# Patient Record
Sex: Male | Born: 1955 | Race: White | Hispanic: No | Marital: Single | Smoking: Current every day smoker
Health system: Southern US, Community
[De-identification: ages and names within clinical notes are randomized; demographics above are authoritative.]

## PROBLEM LIST (undated history)

## (undated) ENCOUNTER — Emergency Department (HOSPITAL_COMMUNITY): Admission: EM | Payer: No Typology Code available for payment source | Source: Home / Self Care

## (undated) DIAGNOSIS — F329 Major depressive disorder, single episode, unspecified: Secondary | ICD-10-CM

## (undated) DIAGNOSIS — J449 Chronic obstructive pulmonary disease, unspecified: Secondary | ICD-10-CM

## (undated) DIAGNOSIS — J45909 Unspecified asthma, uncomplicated: Secondary | ICD-10-CM

## (undated) DIAGNOSIS — F5104 Psychophysiologic insomnia: Secondary | ICD-10-CM

## (undated) DIAGNOSIS — F32A Depression, unspecified: Secondary | ICD-10-CM

## (undated) DIAGNOSIS — K449 Diaphragmatic hernia without obstruction or gangrene: Secondary | ICD-10-CM

## (undated) DIAGNOSIS — F419 Anxiety disorder, unspecified: Secondary | ICD-10-CM

## (undated) DIAGNOSIS — R0602 Shortness of breath: Secondary | ICD-10-CM

## (undated) DIAGNOSIS — K219 Gastro-esophageal reflux disease without esophagitis: Secondary | ICD-10-CM

## (undated) DIAGNOSIS — G5601 Carpal tunnel syndrome, right upper limb: Secondary | ICD-10-CM

## (undated) DIAGNOSIS — Z720 Tobacco use: Secondary | ICD-10-CM

## (undated) DIAGNOSIS — K429 Umbilical hernia without obstruction or gangrene: Secondary | ICD-10-CM

## (undated) DIAGNOSIS — E119 Type 2 diabetes mellitus without complications: Secondary | ICD-10-CM

## (undated) DIAGNOSIS — E039 Hypothyroidism, unspecified: Secondary | ICD-10-CM

## (undated) DIAGNOSIS — R42 Dizziness and giddiness: Secondary | ICD-10-CM

## (undated) DIAGNOSIS — N529 Male erectile dysfunction, unspecified: Secondary | ICD-10-CM

## (undated) DIAGNOSIS — M1990 Unspecified osteoarthritis, unspecified site: Secondary | ICD-10-CM

## (undated) DIAGNOSIS — F319 Bipolar disorder, unspecified: Secondary | ICD-10-CM

## (undated) DIAGNOSIS — E785 Hyperlipidemia, unspecified: Secondary | ICD-10-CM

## (undated) HISTORY — DX: Gastro-esophageal reflux disease without esophagitis: K21.9

## (undated) HISTORY — DX: Shortness of breath: R06.02

## (undated) HISTORY — DX: Anxiety disorder, unspecified: F41.9

## (undated) HISTORY — DX: Depression, unspecified: F32.A

## (undated) HISTORY — DX: Umbilical hernia without obstruction or gangrene: K42.9

## (undated) HISTORY — DX: Tobacco use: Z72.0

## (undated) HISTORY — DX: Male erectile dysfunction, unspecified: N52.9

## (undated) HISTORY — DX: Psychophysiologic insomnia: F51.04

## (undated) HISTORY — PX: COLONOSCOPY: SHX174

## (undated) HISTORY — DX: Dizziness and giddiness: R42

## (undated) HISTORY — DX: Major depressive disorder, single episode, unspecified: F32.9

## (undated) HISTORY — DX: Unspecified asthma, uncomplicated: J45.909

## (undated) HISTORY — DX: Carpal tunnel syndrome, right upper limb: G56.01

## (undated) HISTORY — DX: Unspecified osteoarthritis, unspecified site: M19.90

## (undated) HISTORY — PX: UPPER GI ENDOSCOPY: SHX6162

## (undated) HISTORY — DX: Hyperlipidemia, unspecified: E78.5

## (undated) HISTORY — DX: Diaphragmatic hernia without obstruction or gangrene: K44.9

## (undated) HISTORY — DX: Bipolar disorder, unspecified: F31.9

## (undated) HISTORY — DX: Chronic obstructive pulmonary disease, unspecified: J44.9

---

## 1997-11-21 ENCOUNTER — Emergency Department (HOSPITAL_COMMUNITY): Admission: EM | Admit: 1997-11-21 | Discharge: 1997-11-21 | Payer: Self-pay | Admitting: Emergency Medicine

## 1998-07-25 ENCOUNTER — Encounter: Payer: Self-pay | Admitting: Family Medicine

## 1998-07-25 ENCOUNTER — Ambulatory Visit (HOSPITAL_COMMUNITY): Admission: RE | Admit: 1998-07-25 | Discharge: 1998-07-25 | Payer: Self-pay | Admitting: Family Medicine

## 1999-09-30 ENCOUNTER — Ambulatory Visit (HOSPITAL_COMMUNITY): Admission: RE | Admit: 1999-09-30 | Discharge: 1999-09-30 | Payer: Self-pay | Admitting: Family Medicine

## 1999-09-30 ENCOUNTER — Encounter: Payer: Self-pay | Admitting: Family Medicine

## 2001-12-30 ENCOUNTER — Emergency Department (HOSPITAL_COMMUNITY): Admission: AC | Admit: 2001-12-30 | Discharge: 2001-12-30 | Payer: Self-pay

## 2002-02-17 ENCOUNTER — Emergency Department (HOSPITAL_COMMUNITY): Admission: EM | Admit: 2002-02-17 | Discharge: 2002-02-17 | Payer: Self-pay | Admitting: *Deleted

## 2002-06-13 ENCOUNTER — Encounter: Payer: Self-pay | Admitting: Emergency Medicine

## 2002-06-13 ENCOUNTER — Emergency Department (HOSPITAL_COMMUNITY): Admission: EM | Admit: 2002-06-13 | Discharge: 2002-06-13 | Payer: Self-pay | Admitting: Emergency Medicine

## 2002-08-16 ENCOUNTER — Encounter: Admission: RE | Admit: 2002-08-16 | Discharge: 2002-08-16 | Payer: Self-pay | Admitting: Sports Medicine

## 2004-01-06 ENCOUNTER — Encounter (INDEPENDENT_AMBULATORY_CARE_PROVIDER_SITE_OTHER): Payer: Self-pay | Admitting: Specialist

## 2004-01-06 ENCOUNTER — Ambulatory Visit (HOSPITAL_COMMUNITY): Admission: RE | Admit: 2004-01-06 | Discharge: 2004-01-06 | Payer: Self-pay | Admitting: *Deleted

## 2004-08-13 ENCOUNTER — Ambulatory Visit: Payer: Self-pay | Admitting: Internal Medicine

## 2006-08-02 ENCOUNTER — Emergency Department (HOSPITAL_COMMUNITY): Admission: EM | Admit: 2006-08-02 | Discharge: 2006-08-02 | Payer: Self-pay | Admitting: Family Medicine

## 2006-12-06 ENCOUNTER — Ambulatory Visit: Payer: Self-pay | Admitting: Family Medicine

## 2006-12-06 LAB — CONVERTED CEMR LAB
AST: 12 units/L (ref 0–37)
Albumin: 4.7 g/dL (ref 3.5–5.2)
Alkaline Phosphatase: 47 units/L (ref 39–117)
BUN: 18 mg/dL (ref 6–23)
Basophils Absolute: 0.1 10*3/uL (ref 0.0–0.1)
CO2: 26 meq/L (ref 19–32)
Chloride: 104 meq/L (ref 96–112)
Creatinine, Ser: 1.08 mg/dL (ref 0.40–1.50)
Eosinophils Relative: 3 % (ref 0–5)
Lymphs Abs: 3.2 10*3/uL (ref 0.7–3.3)
MCHC: 32.7 g/dL (ref 30.0–36.0)
MCV: 95.8 fL (ref 78.0–100.0)
Sodium: 141 meq/L (ref 135–145)
Total Bilirubin: 0.5 mg/dL (ref 0.3–1.2)
Total Protein: 7.4 g/dL (ref 6.0–8.3)

## 2007-01-25 ENCOUNTER — Encounter (INDEPENDENT_AMBULATORY_CARE_PROVIDER_SITE_OTHER): Payer: Self-pay | Admitting: *Deleted

## 2007-04-05 ENCOUNTER — Ambulatory Visit: Payer: Self-pay | Admitting: Family Medicine

## 2007-04-13 ENCOUNTER — Ambulatory Visit (HOSPITAL_COMMUNITY): Admission: RE | Admit: 2007-04-13 | Discharge: 2007-04-13 | Payer: Self-pay | Admitting: Family Medicine

## 2007-06-05 ENCOUNTER — Ambulatory Visit: Payer: Self-pay | Admitting: Family Medicine

## 2007-06-08 ENCOUNTER — Ambulatory Visit: Payer: Self-pay | Admitting: *Deleted

## 2007-07-03 ENCOUNTER — Ambulatory Visit: Payer: Self-pay | Admitting: Internal Medicine

## 2008-12-14 ENCOUNTER — Ambulatory Visit (HOSPITAL_COMMUNITY): Admission: RE | Admit: 2008-12-14 | Discharge: 2008-12-14 | Payer: Self-pay | Admitting: Family Medicine

## 2009-02-16 ENCOUNTER — Emergency Department (HOSPITAL_COMMUNITY): Admission: EM | Admit: 2009-02-16 | Discharge: 2009-02-16 | Payer: Self-pay | Admitting: Emergency Medicine

## 2009-02-17 ENCOUNTER — Emergency Department (HOSPITAL_COMMUNITY): Admission: EM | Admit: 2009-02-17 | Discharge: 2009-02-17 | Payer: Self-pay | Admitting: Emergency Medicine

## 2009-03-29 ENCOUNTER — Emergency Department (HOSPITAL_COMMUNITY): Admission: EM | Admit: 2009-03-29 | Discharge: 2009-03-30 | Payer: Self-pay | Admitting: Emergency Medicine

## 2009-04-03 ENCOUNTER — Emergency Department (HOSPITAL_COMMUNITY): Admission: EM | Admit: 2009-04-03 | Discharge: 2009-04-03 | Payer: Self-pay | Admitting: Emergency Medicine

## 2009-04-05 ENCOUNTER — Emergency Department (HOSPITAL_COMMUNITY): Admission: EM | Admit: 2009-04-05 | Discharge: 2009-04-05 | Payer: Self-pay | Admitting: Emergency Medicine

## 2009-04-09 ENCOUNTER — Emergency Department (HOSPITAL_COMMUNITY): Admission: EM | Admit: 2009-04-09 | Discharge: 2009-04-09 | Payer: Self-pay | Admitting: Emergency Medicine

## 2009-04-17 ENCOUNTER — Emergency Department (HOSPITAL_COMMUNITY): Admission: EM | Admit: 2009-04-17 | Discharge: 2009-04-17 | Payer: Self-pay | Admitting: Emergency Medicine

## 2009-05-27 ENCOUNTER — Other Ambulatory Visit: Payer: Self-pay

## 2009-05-28 ENCOUNTER — Inpatient Hospital Stay (HOSPITAL_COMMUNITY): Admission: AD | Admit: 2009-05-28 | Discharge: 2009-05-30 | Payer: Self-pay | Admitting: Psychiatry

## 2009-05-28 ENCOUNTER — Ambulatory Visit: Payer: Self-pay | Admitting: Psychiatry

## 2009-07-30 ENCOUNTER — Emergency Department (HOSPITAL_COMMUNITY): Admission: EM | Admit: 2009-07-30 | Discharge: 2009-07-30 | Payer: Self-pay | Admitting: Emergency Medicine

## 2009-08-02 ENCOUNTER — Emergency Department (HOSPITAL_COMMUNITY): Admission: EM | Admit: 2009-08-02 | Discharge: 2009-08-02 | Payer: Self-pay | Admitting: Emergency Medicine

## 2009-08-03 ENCOUNTER — Other Ambulatory Visit: Payer: Self-pay | Admitting: Emergency Medicine

## 2009-08-03 ENCOUNTER — Ambulatory Visit: Payer: Self-pay | Admitting: Psychiatry

## 2009-08-04 ENCOUNTER — Inpatient Hospital Stay (HOSPITAL_COMMUNITY): Admission: AD | Admit: 2009-08-04 | Discharge: 2009-08-08 | Payer: Self-pay | Admitting: Psychiatry

## 2009-09-22 ENCOUNTER — Emergency Department (HOSPITAL_COMMUNITY): Admission: EM | Admit: 2009-09-22 | Discharge: 2009-09-22 | Payer: Self-pay | Admitting: Emergency Medicine

## 2009-09-25 ENCOUNTER — Emergency Department (HOSPITAL_COMMUNITY): Admission: EM | Admit: 2009-09-25 | Discharge: 2009-09-25 | Payer: Self-pay | Admitting: Emergency Medicine

## 2009-10-03 ENCOUNTER — Emergency Department (HOSPITAL_COMMUNITY): Admission: EM | Admit: 2009-10-03 | Discharge: 2009-10-03 | Payer: Self-pay | Admitting: Emergency Medicine

## 2009-10-30 ENCOUNTER — Emergency Department (HOSPITAL_COMMUNITY): Admission: EM | Admit: 2009-10-30 | Discharge: 2009-10-30 | Payer: Self-pay | Admitting: Emergency Medicine

## 2010-06-01 ENCOUNTER — Encounter: Payer: Self-pay | Admitting: Family Medicine

## 2010-07-26 LAB — URINALYSIS, ROUTINE W REFLEX MICROSCOPIC
Bilirubin Urine: NEGATIVE
Hgb urine dipstick: NEGATIVE
Ketones, ur: NEGATIVE mg/dL
Nitrite: NEGATIVE
Protein, ur: NEGATIVE mg/dL
Specific Gravity, Urine: 1.007 (ref 1.005–1.030)
Urobilinogen, UA: 0.2 mg/dL (ref 0.0–1.0)

## 2010-07-26 LAB — DIFFERENTIAL
Eosinophils Relative: 7 % — ABNORMAL HIGH (ref 0–5)
Monocytes Absolute: 0.7 10*3/uL (ref 0.1–1.0)

## 2010-07-26 LAB — COMPREHENSIVE METABOLIC PANEL
ALT: 32 U/L (ref 0–53)
AST: 23 U/L (ref 0–37)
Albumin: 4.5 g/dL (ref 3.5–5.2)
CO2: 25 mEq/L (ref 19–32)
GFR calc non Af Amer: >60 mL/min (ref >60)
Total Protein: 7.6 g/dL (ref 6.0–8.3)

## 2010-07-26 LAB — CBC
Hemoglobin: 16.4 g/dL (ref 13.0–17.0)
MCHC: 35.5 g/dL (ref 30.0–36.0)
MCV: 91.9 fL (ref 78.0–100.0)
RDW: 13.5 % (ref 11.5–15.5)

## 2010-07-26 LAB — RAPID URINE DRUG SCREEN, HOSP PERFORMED
Opiates: NOT DETECTED
Tetrahydrocannabinol: NOT DETECTED

## 2010-08-03 LAB — DIFFERENTIAL
Basophils Relative: 1 % (ref 0–1)
Lymphocytes Relative: 24 % (ref 12–46)
Lymphs Abs: 2.2 10*3/uL (ref 0.7–4.0)
Monocytes Relative: 6 % (ref 3–12)
Neutrophils Relative %: 64 % (ref 43–77)

## 2010-08-03 LAB — BASIC METABOLIC PANEL
BUN: 15 mg/dL (ref 6–23)
CO2: 22 mEq/L (ref 19–32)
Calcium: 9 mg/dL (ref 8.4–10.5)
Creatinine, Ser: 1.14 mg/dL (ref 0.4–1.5)
GFR calc Af Amer: >60 mL/min (ref >60)
GFR calc non Af Amer: >60 mL/min (ref >60)
Glucose, Bld: 109 mg/dL — ABNORMAL HIGH (ref 70–99)
Potassium: 3.7 mEq/L (ref 3.5–5.1)
Sodium: 134 mEq/L — ABNORMAL LOW (ref 135–145)

## 2010-08-03 LAB — CBC
HCT: 39.8 % (ref 39.0–52.0)
MCV: 94.4 fL (ref 78.0–100.0)
Platelets: 257 10*3/uL (ref 150–400)
RBC: 4.22 MIL/uL (ref 4.22–5.81)
WBC: 9.2 10*3/uL (ref 4.0–10.5)

## 2010-08-03 LAB — RAPID URINE DRUG SCREEN, HOSP PERFORMED
Barbiturates: NOT DETECTED
Tetrahydrocannabinol: NOT DETECTED

## 2010-08-03 LAB — HEPATIC FUNCTION PANEL
ALT: 21 U/L (ref 0–53)
Albumin: 3.4 g/dL — ABNORMAL LOW (ref 3.5–5.2)

## 2010-08-03 LAB — ETHANOL: Alcohol, Ethyl (B): <5 mg/dL (ref 0–10)

## 2010-08-03 LAB — LITHIUM LEVEL: Lithium Lvl: 0.83 mEq/L (ref 0.80–1.40)

## 2011-01-30 ENCOUNTER — Emergency Department (HOSPITAL_COMMUNITY)
Admission: EM | Admit: 2011-01-30 | Discharge: 2011-01-30 | Disposition: A | Discharge status: Home / Self Care | Payer: Medicaid Other | Attending: Emergency Medicine | Admitting: Emergency Medicine

## 2011-01-30 DIAGNOSIS — F1320 Sedative, hypnotic or anxiolytic dependence, uncomplicated: Secondary | ICD-10-CM | POA: Insufficient documentation

## 2011-01-30 DIAGNOSIS — R259 Unspecified abnormal involuntary movements: Secondary | ICD-10-CM | POA: Insufficient documentation

## 2011-01-30 DIAGNOSIS — Z79899 Other long term (current) drug therapy: Secondary | ICD-10-CM | POA: Insufficient documentation

## 2011-01-30 DIAGNOSIS — F19939 Other psychoactive substance use, unspecified with withdrawal, unspecified: Secondary | ICD-10-CM | POA: Insufficient documentation

## 2011-01-30 DIAGNOSIS — F411 Generalized anxiety disorder: Secondary | ICD-10-CM | POA: Insufficient documentation

## 2011-01-30 DIAGNOSIS — F319 Bipolar disorder, unspecified: Secondary | ICD-10-CM | POA: Insufficient documentation

## 2011-03-05 ENCOUNTER — Emergency Department (HOSPITAL_COMMUNITY)
Admission: EM | Admit: 2011-03-05 | Discharge: 2011-03-05 | Disposition: A | Discharge status: Home / Self Care | Payer: Medicaid Other | Attending: Emergency Medicine | Admitting: Emergency Medicine

## 2011-03-05 ENCOUNTER — Emergency Department (HOSPITAL_COMMUNITY): Payer: Medicaid Other

## 2011-03-05 DIAGNOSIS — R079 Chest pain, unspecified: Secondary | ICD-10-CM | POA: Insufficient documentation

## 2011-03-05 DIAGNOSIS — S20219A Contusion of unspecified front wall of thorax, initial encounter: Secondary | ICD-10-CM | POA: Insufficient documentation

## 2011-03-07 ENCOUNTER — Emergency Department (HOSPITAL_COMMUNITY)
Admission: EM | Admit: 2011-03-07 | Discharge: 2011-03-07 | Disposition: A | Discharge status: Home / Self Care | Payer: Medicaid Other | Attending: Emergency Medicine | Admitting: Emergency Medicine

## 2011-03-07 DIAGNOSIS — R079 Chest pain, unspecified: Secondary | ICD-10-CM | POA: Insufficient documentation

## 2011-03-07 DIAGNOSIS — IMO0002 Reserved for concepts with insufficient information to code with codable children: Secondary | ICD-10-CM | POA: Insufficient documentation

## 2011-03-07 DIAGNOSIS — S20219A Contusion of unspecified front wall of thorax, initial encounter: Secondary | ICD-10-CM | POA: Insufficient documentation

## 2011-03-07 DIAGNOSIS — Y92009 Unspecified place in unspecified non-institutional (private) residence as the place of occurrence of the external cause: Secondary | ICD-10-CM | POA: Insufficient documentation

## 2011-03-10 ENCOUNTER — Emergency Department (HOSPITAL_COMMUNITY)
Admission: EM | Admit: 2011-03-10 | Discharge: 2011-03-10 | Disposition: A | Discharge status: Home / Self Care | Payer: Medicaid Other | Attending: Emergency Medicine | Admitting: Emergency Medicine

## 2011-03-10 DIAGNOSIS — S20219A Contusion of unspecified front wall of thorax, initial encounter: Secondary | ICD-10-CM | POA: Insufficient documentation

## 2011-09-17 ENCOUNTER — Encounter: Payer: Self-pay | Admitting: *Deleted

## 2011-12-15 ENCOUNTER — Encounter: Payer: Self-pay | Admitting: Cardiology

## 2012-01-20 ENCOUNTER — Encounter: Payer: Self-pay | Admitting: Cardiology

## 2012-02-07 ENCOUNTER — Encounter: Payer: Self-pay | Admitting: Internal Medicine

## 2012-03-03 ENCOUNTER — Encounter: Payer: Self-pay | Admitting: Internal Medicine

## 2012-03-03 ENCOUNTER — Ambulatory Visit: Payer: Medicaid Other | Admitting: Internal Medicine

## 2012-03-06 ENCOUNTER — Encounter: Payer: Self-pay | Admitting: Internal Medicine

## 2012-03-06 ENCOUNTER — Ambulatory Visit (INDEPENDENT_AMBULATORY_CARE_PROVIDER_SITE_OTHER): Payer: Medicaid Other | Admitting: Internal Medicine

## 2012-03-06 VITALS — BP 126/72 | HR 88 | Ht 67.0 in | Wt 172.0 lb

## 2012-03-06 DIAGNOSIS — R1310 Dysphagia, unspecified: Secondary | ICD-10-CM

## 2012-03-06 DIAGNOSIS — E039 Hypothyroidism, unspecified: Secondary | ICD-10-CM | POA: Insufficient documentation

## 2012-03-06 DIAGNOSIS — K5900 Constipation, unspecified: Secondary | ICD-10-CM

## 2012-03-06 DIAGNOSIS — K219 Gastro-esophageal reflux disease without esophagitis: Secondary | ICD-10-CM | POA: Insufficient documentation

## 2012-03-06 DIAGNOSIS — Z1211 Encounter for screening for malignant neoplasm of colon: Secondary | ICD-10-CM

## 2012-03-06 DIAGNOSIS — R1011 Right upper quadrant pain: Secondary | ICD-10-CM

## 2012-03-06 DIAGNOSIS — J449 Chronic obstructive pulmonary disease, unspecified: Secondary | ICD-10-CM | POA: Insufficient documentation

## 2012-03-06 MED ORDER — MOVIPREP 100 G PO SOLR: 1.0000 | Freq: Once | ORAL | Status: DC

## 2012-03-06 NOTE — Progress Notes (Signed)
Patient ID: [name] [name], male   DOB: 1956-03-24, 56 y.o.   MRN: [id number]  SUBJECTIVE: HPI [name] [name] is a 56 year old male with a past medical history of GERD, COPD, hypothyroidism, and anxiety who is seen in consultation at the request of Dr. Andrey Campanile for evaluation of GERD and epigastric and right upper quadrant abdominal pain.  The patient reports a long-standing history of heartburn which has been treated with PPI. He recalls taking omeprazole but being switched by his primary doctor to pantoprazole. With this he has continued to have intermittent heartburn as well as epigastric abdominal pain. He reports that he's had a decreased appetite over the last few months as well. He denies early satiety. He also reports occasional solid food dysphagia and odynophagia. He has not lost weight. He reports an epigastric and right upper quadrant pain which feels like a "fullness" which also tends to be worse after eating. On the whole he feels constipated and has used Dulcolax at times. He denies diarrhea. He denies rectal bleeding and melena. No fevers or chills. He reports a remote colonoscopy greater than 10 years ago. He reports his thyroid function has been monitored recently by his PCP and he continues on Synthroid 150 mcg daily.  Review of Systems  As per history of present illness, otherwise negative   Past Medical History  Diagnosis Date  . SOB (shortness of breath)   . Dizziness   . Anxiety   . Arthritis   . Asthma   . COPD (chronic obstructive pulmonary disease)   . Thyroid disease     Current Outpatient Prescriptions  Medication Sig Dispense Refill  . albuterol (PROVENTIL HFA;VENTOLIN HFA) 108 (90 BASE) MCG/ACT inhaler Inhale 2 puffs into the lungs every 6 (six) hours as needed.      . ALPRAZolam (XANAX) 1 MG tablet Take 1 mg by mouth 3 (three) times daily as needed.       . budesonide-formoterol (SYMBICORT) 160-4.5 MCG/ACT inhaler Inhale 2 puffs into the lungs 2 (two) times daily.      Marland Kitchen  levothyroxine (SYNTHROID, LEVOTHROID) 150 MCG tablet Take 150 mcg by mouth daily.      . pantoprazole (PROTONIX) 40 MG tablet Take 40 mg by mouth daily.      . QUEtiapine (SEROQUEL) 300 MG tablet Take 300 mg by mouth at bedtime.      . traZODone (DESYREL) 100 MG tablet Take 300 mg by mouth at bedtime.      Marland Kitchen MOVIPREP 100 G SOLR Take 1 kit (100 g total) by mouth once.  1 kit  0    No Known Allergies  Family History  Problem Relation Age of Onset  . Arrhythmia    . ALS    . Prostate cancer Maternal Grandfather   . Colon cancer Paternal Grandfather     History  Substance Use Topics  . Smoking status: Current Every Day Smoker -- 1.0 packs/day for 40 years    Types: Cigarettes  . Smokeless tobacco: Never Used  . Alcohol Use: Yes     social    OBJECTIVE: BP 126/72  Pulse 88  Ht 5\' 7"  (1.702 m)  Wt 172 lb (78.019 kg)  BMI 26.94 kg/m2 Constitutional: Well-developed and well-nourished. No distress. HEENT: Normocephalic and atraumatic. Oropharynx is clear and moist. No oropharyngeal exudate. Conjunctivae are normal. No scleral icterus. Neck: Neck supple. Trachea midline. Cardiovascular: Normal rate, regular rhythm and intact distal pulses. No M/R/G Pulmonary/chest: Effort normal and breath sounds normal. No wheezing, rales or  rhonchi. Abdominal: Soft, mild epigastric pain without rebound or guarding, nondistended. Bowel sounds active throughout. There are no masses palpable. No hepatosplenomegaly. Extremities: no clubbing, cyanosis, or edema Lymphadenopathy: No cervical adenopathy noted. Neurological: Alert and oriented to person place and time. Skin: Skin is warm and dry. No rashes noted. Psychiatric: Normal mood and affect. Behavior is normal.  Recent labs also requested from PCP  ASSESSMENT AND PLAN: 55 year old male with a past medical history of GERD, COPD, hypothyroidism, and anxiety who is seen in consultation at the request of Dr. Andrey Campanile for evaluation of GERD and  epigastric and right upper quadrant abdominal pain.    1.  GERD/dyspepsia/dysphagia with odynophagia/right upper quadrant pain -- the patient has continued to have breakthrough heartburn as well as dyspepsia despite once daily PPI. Based on this, I recommended direct visualization with upper endoscopy. We discussed the test including the risks and benefits and he is agreeable to proceed. We also discussed the need for possible dilation should stricture or esophageal ring be found. Esophageal candidiasis is also possible given he is on Symbicort, but we can evaluate for this at endoscopy. I would also like to perform an abdominal ultrasound to better evaluate his gallbladder and rule out stone disease given his right upper quadrant pain.  2.  CRC screening -- he is due for screening colonoscopy being his last exam was greater than 10 years ago. This will be performed at the same time as his upper endoscopy. We discussed the risks and benefits of both tests and he is agreeable to proceed.  3.  Constipation -- I recommended daily MiraLAX 17 g.

## 2012-03-06 NOTE — Patient Instructions (Addendum)
You have been given a separate informational sheet regarding your tobacco use, the importance of quitting and local resources to help you quit.  You have been scheduled for an endoscopy and colonoscopy with propofol. Please follow the written instructions given to you at your visit today. Please pick up your prep at the pharmacy within the next 1-3 days. If you use inhalers (even only as needed) or a CPAP machine, please bring them with you on the day of your procedure.  Please use Miralax daily as directed. Can purchase over the counter  We have sent the following medications to your pharmacy for you to pick up at your convenience: Moviprep  We will call Dr. Andrey Campanile and obtain your lab work   You have been scheduled for an abdominal ultrasound at [facility] (1st floor of hospital) on 03-13-2012 at 8:00 am please arrive 15 minutes prior to your appointment for registration. Make certain not to have anything to eat or drink after midnight prior to your appointment. Should you need to reschedule your appointment, please contact radiology at [phone]. This test typically takes about 30 minutes to perform.

## 2012-03-07 ENCOUNTER — Telehealth: Payer: Self-pay | Admitting: Internal Medicine

## 2012-03-07 MED ORDER — POLYETHYLENE GLYCOL 3350 17 GM/SCOOP PO POWD: 17.0000 g | Freq: Every day | ORAL | Status: DC

## 2012-03-07 NOTE — Telephone Encounter (Signed)
Sent Miralax into pt's pharamcy

## 2012-03-08 ENCOUNTER — Encounter: Payer: Self-pay | Admitting: Internal Medicine

## 2012-03-08 ENCOUNTER — Ambulatory Visit (AMBULATORY_SURGERY_CENTER): Payer: Medicaid Other | Admitting: Internal Medicine

## 2012-03-08 VITALS — BP 111/58 | HR 67 | Temp 97.1°F | Resp 20 | Ht 67.0 in | Wt 172.0 lb

## 2012-03-08 DIAGNOSIS — K2970 Gastritis, unspecified, without bleeding: Secondary | ICD-10-CM

## 2012-03-08 DIAGNOSIS — K219 Gastro-esophageal reflux disease without esophagitis: Secondary | ICD-10-CM

## 2012-03-08 DIAGNOSIS — D126 Benign neoplasm of colon, unspecified: Secondary | ICD-10-CM

## 2012-03-08 DIAGNOSIS — R1011 Right upper quadrant pain: Secondary | ICD-10-CM

## 2012-03-08 DIAGNOSIS — K5900 Constipation, unspecified: Secondary | ICD-10-CM

## 2012-03-08 DIAGNOSIS — K210 Gastro-esophageal reflux disease with esophagitis: Secondary | ICD-10-CM

## 2012-03-08 DIAGNOSIS — K635 Polyp of colon: Secondary | ICD-10-CM

## 2012-03-08 DIAGNOSIS — K2990 Gastroduodenitis, unspecified, without bleeding: Secondary | ICD-10-CM

## 2012-03-08 DIAGNOSIS — R1310 Dysphagia, unspecified: Secondary | ICD-10-CM

## 2012-03-08 DIAGNOSIS — Z1211 Encounter for screening for malignant neoplasm of colon: Secondary | ICD-10-CM

## 2012-03-08 MED ORDER — SODIUM CHLORIDE 0.9 % IV SOLN: 500.0000 mL | INTRAVENOUS | Status: DC

## 2012-03-08 NOTE — Op Note (Signed)
[facility] [address] [place], [address]   COLONOSCOPY PROCEDURE REPORT  PATIENT: [name] [name], [name] [name]  MR#: [id number] BIRTHDATE: November 20, 1955 , 56  yrs. old GENDER: Male ENDOSCOPIST: Beverley Fiedler, MD PROCEDURE DATE:  03/08/2012 PROCEDURE:   Colonoscopy with cold biopsy polypectomy ASA CLASS:   Class II INDICATIONS:average risk screening and last colonoscopy performed greater than 10 years ago per patient. MEDICATIONS: MAC sedation, administered by CRNA and propofol (Diprivan) 150mg  IV  DESCRIPTION OF PROCEDURE:   After the risks benefits and alternatives of the procedure were thoroughly explained, informed consent was obtained.  A digital rectal exam revealed external hemorrhoids.   The LB CF-H180AL [id number]  endoscope was introduced through the anus and advanced to the terminal ileum which was intubated for a short distance. No adverse events experienced. The quality of the prep was good, using MoviPrep  The instrument was then slowly withdrawn as the colon was fully examined.   COLON FINDINGS: The mucosa appeared normal in the terminal ileum. Five small sessile polyps, ranging between 3-44mm in size, were found in the distal sigmoid colon and rectum.  Polypectomy was performed with cold forceps.  All resections were complete and all polyp tissue was completely retrieved.   Moderate sized internal hemorrhoids were found.   The colon mucosa was otherwise normal. Retroflexed views revealed internal hemorrhoids. The time to cecum=2 minutes 42 seconds.  Withdrawal time=11 minutes 10 seconds. The scope was withdrawn and the procedure completed. COMPLICATIONS: There were no complications.  ENDOSCOPIC IMPRESSION: 1.   Normal mucosa in the terminal ileum 2.  Five small sessile polyps, ranging between 3-10mm in size, were found in the distal sigmoid colon and rectum; Polypectomy was performed with cold forceps 3.   Moderate sized internal hemorrhoids and small  external hemorrhoids 4.   The colon mucosa was otherwise normal  RECOMMENDATIONS: 1.  Await pathology results 2.  Timing of repeat colonoscopy will be determined by pathology findings. 3.  You will receive a letter within 1-2 weeks with the results of your biopsy as well as final recommendations.  Please call my office if you have not received a letter after 3 weeks.   eSigned:  Beverley Fiedler, MD 03/08/2012 2:16 PM   cc: Benedetto Goad, MD and The Patient

## 2012-03-08 NOTE — Op Note (Signed)
[facility] [address] [place], [address]   ENDOSCOPY PROCEDURE REPORT  [name]: [name], [name]  MR#: [id number] BIRTHDATE: 1955/10/01 , 56  yrs. old GENDER: Male ENDOSCOPIST: Beverley Fiedler, MD REFERRED BY:  Benedetto Goad PROCEDURE DATE:  03/08/2012 PROCEDURE:  EGD w/ biopsy ASA CLASS:     Class II INDICATIONS:  history of GERD.   dyspepsia.   abdominal pain in [name] upper right quadrant. MEDICATIONS: MAC sedation, administered by CRNA and propofol (Diprivan) 250mg  IV TOPICAL ANESTHETIC: Cetacaine Spray  DESCRIPTION OF PROCEDURE: After [name] risks benefits and alternatives of [name] procedure were thoroughly explained, informed consent was obtained.  [name] LB GIF-H180 [id number] endoscope was introduced through [name] mouth and advanced to [name] second portion of [name] duodenum. Without limitations.  [name] instrument was slowly withdrawn as [name] mucosa was fully examined.  ESOPHAGUS: An irregular Z-line was observed 38 cm from [name] incisors. There was also an island of salmon-colored mucosa at 36 cm from [name] incisors suspicious for metaplasia.  Multiple biopsies were performed using cold forceps.  Sample sent for histology.   [name] esophagus was otherwise normal.  STOMACH: A hiatus hernia was found.   Moderate gastritis (inflammation) characterized by erythema and nodularity was found in [name] entire examined stomach.  Biopsies were taken in [name] antrum and angularis.  DUODENUM: [name] duodenal mucosa showed no abnormalities in [name] bulb and second portion of [name] duodenum.  Retroflexed views revealed a hiatal hernia.     [name] scope was then withdrawn from [name] [name] and [name] procedure completed.  COMPLICATIONS: There were no complications.  ENDOSCOPIC IMPRESSION: 1.   Irregular Z-line was observed 38 cm from [name] incisors; multiple biopsies to exclude Barrett's esophagus 2.   [name] esophagus was otherwise normal. 3.   Hiatus hernia was found 4.   Gastritis (inflammation) was found  in [name] entire examined stomach; biopsies were taken in [name] antrum and angularis 5.   [name] duodenal mucosa showed no abnormalities in [name] bulb and second portion of [name] duodenum  RECOMMENDATIONS: 1.  Await pathology results 2.   Continue pantoprazole 40 mg daily for acid control.  This is best taken 30 minutes before your first meal of [name] day. 3.  Await results of abdominal ultrasound.  eSigned:  Beverley Fiedler, MD 03/08/2012 2:11 PM  MV:HQIO Andrey Campanile, MD and [name] [name]

## 2012-03-08 NOTE — Patient Instructions (Addendum)

## 2012-03-08 NOTE — Progress Notes (Signed)
Patient did not experience any of the following events: a burn prior to discharge; a fall within the facility; wrong site/side/patient/procedure/implant event; or a hospital transfer or hospital admission upon discharge from the facility. (G8907) Patient did not have preoperative order for IV antibiotic SSI prophylaxis. (G8918)  

## 2012-03-09 ENCOUNTER — Telehealth: Payer: Self-pay | Admitting: *Deleted

## 2012-03-09 NOTE — Telephone Encounter (Signed)
  Follow up Call-  Call back number 03/08/2012  Post procedure Call Back phone  # [phone]  Permission to leave phone message Yes     Patient questions:  Do you have a fever, pain , or abdominal swelling? no Pain Score  0 *  Have you tolerated food without any problems? yes  Have you been able to return to your normal activities? yes  Do you have any questions about your discharge instructions: Diet   no Medications  no Follow up visit  no  Do you have questions or concerns about your Care? no  Actions: * If pain score is 4 or above: No action needed, pain <4.

## 2012-03-10 ENCOUNTER — Encounter: Payer: Self-pay | Admitting: Cardiology

## 2012-03-13 ENCOUNTER — Encounter: Payer: Self-pay | Admitting: Internal Medicine

## 2012-03-13 ENCOUNTER — Ambulatory Visit (HOSPITAL_COMMUNITY)
Admission: RE | Admit: 2012-03-13 | Discharge: 2012-03-13 | Disposition: A | Discharge status: Home / Self Care | Payer: Medicaid Other | Source: Ambulatory Visit | Attending: Internal Medicine | Admitting: Internal Medicine

## 2012-03-13 DIAGNOSIS — R1011 Right upper quadrant pain: Secondary | ICD-10-CM | POA: Insufficient documentation

## 2012-03-16 ENCOUNTER — Telehealth: Payer: Self-pay | Admitting: Gastroenterology

## 2012-03-16 DIAGNOSIS — R109 Unspecified abdominal pain: Secondary | ICD-10-CM

## 2012-03-16 NOTE — Telephone Encounter (Signed)
Notes Recorded by Beverley Fiedler, MD on 03/13/2012 at 3:23 PM The ultrasound reveals mild gallbladder wall thickening but no evidence of stones This does not necessarily mean the gallbladder is diseased and he continues to have right upper quadrant pain I recommend HIDA scan with CCK for more thorough gallbladder assessment. I sent a pathology letter stating his lower esophagus revealed reflux related inflammation but no evidence of Barrett's esophagus. Stomach biopsies negative for H. Pylori He should continue pantoprazole daily and follow up in clinic in 4-6 weeks   Informed pt of results and he reports he's still having the same problems. Pt reports feeling " raw or irritated in my intestines"; he states this has been going on for about a month. He reports the location is above his waist in the middle of his abdomen at the umbilicus. Food has no impact on the problem and he is having normal stools. He would like [place] to fax info to his PCP, Dr Benedetto Goad- done. Pt wants Dr Rhea Belton to know he bought electronic cigarettes.  Dr Rhea Belton, please advise; do I order the HIDA scan? Thanks.

## 2012-03-16 NOTE — Telephone Encounter (Signed)
Yes, HIDA scan with CCK

## 2012-03-16 NOTE — Telephone Encounter (Signed)
Informed pt of [name] on 03/27/12 at 10am, arrive at 0945am. NPO after midnight and if he needs to take his meds, use a small amount of water. Pt stated understanding.

## 2012-03-27 ENCOUNTER — Encounter (HOSPITAL_COMMUNITY)
Admission: RE | Admit: 2012-03-27 | Discharge: 2012-03-27 | Disposition: A | Discharge status: Home / Self Care | Payer: Medicaid Other | Source: Ambulatory Visit | Attending: Internal Medicine | Admitting: Internal Medicine

## 2012-03-27 DIAGNOSIS — R109 Unspecified abdominal pain: Secondary | ICD-10-CM | POA: Insufficient documentation

## 2012-03-27 MED ORDER — TECHNETIUM TC 99M MEBROFENIN IV KIT
5.2000 | PACK | Freq: Once | INTRAVENOUS | Status: AC | PRN
  Administered 2012-03-27: 5 via INTRAVENOUS

## 2012-03-29 ENCOUNTER — Telehealth: Payer: Self-pay | Admitting: Internal Medicine

## 2012-03-29 DIAGNOSIS — R109 Unspecified abdominal pain: Secondary | ICD-10-CM

## 2012-03-29 NOTE — Telephone Encounter (Signed)
Notes Recorded by Beverley Fiedler, MD on 03/27/2012 at 1:34 PM Normal HIDA scan and thus normal GB. Hopefully abd pain has improved with PPI Can followup in 4-6 weeks      Spoke with pt and informed him of the normal HIDA scan. He reports he is still having the same pain he's had for a month. Pain is in the RUQ but he states his whole " intestinal tract feels irritated ". He rates his pain a 5 on 1-10 scale. Food does not seem to be related. He changed from pantoprazole back to omeprazole because the omeprazole worked better. He is supposed to take Miralax daily but states he forgets some days; states his bowels are not regular. ECL on 03/08/12 with benign polyps and gastritis. U/S showed non-specific gall bladder wall thickening so HIDA was ordered.  I asked if pt has any cardiac hx and he states no, but he is scheduled to see Dr Patty Sermons on 03/31/12. Any suggestions? Thanks.

## 2012-03-29 NOTE — Telephone Encounter (Signed)
If his pain remains that severe, [place], HIDA, EGD and colon already performed, the next step would be CT abd/pelvis with contrast. Cbc, cmp, lipase, amylase

## 2012-03-30 NOTE — Telephone Encounter (Signed)
Patient advised.  CT scan scheduled at [facility] for 04/05/12 1:30.  He is asked to come for labs tomorrow and to pick up contrast and instructions for a CT.

## 2012-03-31 ENCOUNTER — Ambulatory Visit (INDEPENDENT_AMBULATORY_CARE_PROVIDER_SITE_OTHER): Payer: Medicaid Other | Admitting: Cardiology

## 2012-03-31 ENCOUNTER — Other Ambulatory Visit (INDEPENDENT_AMBULATORY_CARE_PROVIDER_SITE_OTHER): Payer: Medicaid Other

## 2012-03-31 ENCOUNTER — Encounter: Payer: Self-pay | Admitting: Cardiology

## 2012-03-31 VITALS — BP 110/72 | HR 68 | Resp 18 | Ht 67.0 in | Wt 173.8 lb

## 2012-03-31 DIAGNOSIS — R109 Unspecified abdominal pain: Secondary | ICD-10-CM

## 2012-03-31 DIAGNOSIS — R079 Chest pain, unspecified: Secondary | ICD-10-CM | POA: Insufficient documentation

## 2012-03-31 LAB — COMPREHENSIVE METABOLIC PANEL
ALT: 21 U/L (ref 0–53)
AST: 16 U/L (ref 0–37)
Albumin: 4.1 g/dL (ref 3.5–5.2)
Alkaline Phosphatase: 57 U/L (ref 39–117)
BUN: 16 mg/dL (ref 6–23)
Calcium: 9.4 mg/dL (ref 8.4–10.5)
Chloride: 105 mEq/L (ref 96–112)
Potassium: 3.8 mEq/L (ref 3.5–5.1)
Sodium: 141 mEq/L (ref 135–145)
Total Protein: 7.5 g/dL (ref 6.0–8.3)

## 2012-03-31 LAB — CBC WITH DIFFERENTIAL/PLATELET
Basophils Absolute: 0.1 10*3/uL (ref 0.0–0.1)
Eosinophils Absolute: 0.5 10*3/uL (ref 0.0–0.7)
HCT: 46.6 % (ref 39.0–52.0)
Hemoglobin: 15.2 g/dL (ref 13.0–17.0)
Lymphocytes Relative: 41.5 % (ref 12.0–46.0)
Lymphs Abs: 4.9 10*3/uL — ABNORMAL HIGH (ref 0.7–4.0)
MCHC: 32.7 g/dL (ref 30.0–36.0)
MCV: 88.6 fl (ref 78.0–100.0)
Monocytes Absolute: 1.1 10*3/uL — ABNORMAL HIGH (ref 0.1–1.0)
Neutro Abs: 5.2 10*3/uL (ref 1.4–7.7)
RDW: 13.3 % (ref 11.5–14.6)

## 2012-03-31 NOTE — Patient Instructions (Addendum)
Your physician recommends that you continue on your current medications as directed. Please refer to the Current Medication list given to you today.  Follow up as needed  

## 2012-03-31 NOTE — Progress Notes (Signed)
[name] [name] Date of Birth:  1955-06-20 [facility] [address] [place], [place]  [address] [phone]         Fax   [phone]  History of Present Illness: This 56 year old gentleman is seen after a three-year absence.  We last saw him in October 2010.  He comes in today because of some intermittent chronic left-sided chest discomfort.  He has a primary care patient of Dr. Benedetto Goad is some refill family practice.  He states that he has been told that he has COPD.  He has an extensive psychiatric history.  He states that he quit smoking 30 days ago.  He has a history of bipolar illness manic depression and anxiety.  States that he does not drink alcohol. His family history reveals that his father died in his 71s of heart-related issues including atrial fibrillation.  His mother is alive and is our patient and has mitral valve prolapse.  He has a sister who died of Hilda Blades disease. Social history reveals that he is retired on medical disability. Review of systems reveals that he is in the midst of a extensive GI workup for abdominal pain.  He is scheduled to have a CT of the abdomen next week.  His chest pain is not suggestive of myocardial ischemia.  Current Outpatient Prescriptions  Medication Sig Dispense Refill  . albuterol (PROVENTIL HFA;VENTOLIN HFA) 108 (90 BASE) MCG/ACT inhaler Inhale 2 puffs into the lungs every 6 (six) hours as needed.      . ALPRAZolam (XANAX) 1 MG tablet Take 1 mg by mouth 3 (three) times daily as needed.       . budesonide-formoterol (SYMBICORT) 160-4.5 MCG/ACT inhaler Inhale 2 puffs into the lungs 2 (two) times daily.      Marland Kitchen levothyroxine (SYNTHROID, LEVOTHROID) 150 MCG tablet Take 150 mcg by mouth daily.      Marland Kitchen omeprazole (PRILOSEC) 20 MG capsule Take 20 mg by mouth daily.      . polyethylene glycol powder (GLYCOLAX/MIRALAX) powder Take 17 g by mouth daily.  255 g  3  . traZODone (DESYREL) 100 MG tablet Take 300 mg by mouth at  bedtime.      . pantoprazole (PROTONIX) 40 MG tablet Take 40 mg by mouth daily.      . QUEtiapine (SEROQUEL) 300 MG tablet Take 300 mg by mouth at bedtime.        Allergies  Allergen Reactions  . Codeine     Nausea/vomiting  . Talwin (Pentazocine)     Hallucination, shaking    Patient Active Problem List  Diagnosis  . GERD (gastroesophageal reflux disease)  . Hypothyroidism  . COPD (chronic obstructive pulmonary disease)  . Chest pain    History  Smoking status  . Current Every Day Smoker -- 1.0 packs/day for 40 years  . Types: Cigarettes  Smokeless tobacco  . Never Used    History  Alcohol Use  . Yes    Comment: social    Family History  Problem Relation Age of Onset  . Arrhythmia    . ALS    . Prostate cancer Maternal Grandfather   . Colon cancer Paternal Grandfather     Review of Systems: Constitutional: no fever chills diaphoresis or fatigue or change in weight.  Head and neck: no hearing loss, no epistaxis, no photophobia or visual disturbance. Respiratory: No cough, shortness of breath or wheezing. Cardiovascular: No  peripheral edema, palpitations. Gastrointestinal: No abdominal distention, no abdominal pain, no  change in bowel habits hematochezia or melena. Genitourinary: No dysuria, no frequency, no urgency, no nocturia. Musculoskeletal:No arthralgias, no back pain, no gait disturbance or myalgias. Neurological: No dizziness, no headaches, no numbness, no seizures, no syncope, no weakness, no tremors. Hematologic: No lymphadenopathy, no easy bruising. Psychiatric: No confusion, no hallucinations, no sleep disturbance.    Physical Exam: Filed Vitals:   03/31/12 1540  BP: 110/72  Pulse: 68  Resp: 18   the general appearance reveals a well-developed well-nourished gentleman who appears to be in no distress.The head and neck exam reveals pupils equal and reactive.  Extraocular movements are full.  There is no scleral icterus.  The mouth and pharynx  are normal.  The neck is supple.  The carotids reveal no bruits.  The jugular venous pressure is normal.  The  thyroid is not enlarged.  There is no lymphadenopathy.  The chest is clear to percussion and auscultation.  There are no rales or rhonchi.  Expansion of the chest is symmetrical.  The precordium is quiet.  The first heart sound is normal.  The second heart sound is physiologically split.  There is no murmur gallop rub or click.  There is no abnormal lift or heave.  The abdomen is soft and nontender.  The bowel sounds are normal.  The liver and spleen are not enlarged.  There are no abdominal masses.  There are no abdominal bruits.  Extremities reveal good pedal pulses.  There is no phlebitis or edema.  There is no cyanosis or clubbing.  Strength is normal and symmetrical in all extremities.  There is no lateralizing weakness.  There are no sensory deficits.  The skin is warm and dry.  There is no rash.  EKG shows normal sinus rhythm and is within normal limits   Assessment / Plan: Impression is noncardiac chest wall pain.  He had a normal echocardiogram in 2010.  The patient was reassured that this is not his heart.  Continue same medications and continue with gastroenterology workup.  Recheck here when necessary

## 2012-04-03 ENCOUNTER — Telehealth: Payer: Self-pay | Admitting: *Deleted

## 2012-04-03 NOTE — Telephone Encounter (Signed)
Pt walked in stating he was to come back after labs on Friday and he forgot. Per chart, pt was to pick up oral contrast and instructions for his CT. Pt given instructions with NPO status and times to drink the oral contrast; pt stated understanding,

## 2012-04-05 ENCOUNTER — Ambulatory Visit (INDEPENDENT_AMBULATORY_CARE_PROVIDER_SITE_OTHER)
Admission: RE | Admit: 2012-04-05 | Discharge: 2012-04-05 | Disposition: A | Discharge status: Home / Self Care | Payer: Medicaid Other | Source: Ambulatory Visit | Attending: Internal Medicine | Admitting: Internal Medicine

## 2012-04-05 DIAGNOSIS — R109 Unspecified abdominal pain: Secondary | ICD-10-CM

## 2012-04-05 MED ORDER — IOHEXOL 300 MG/ML  SOLN
80.0000 mL | Freq: Once | INTRAMUSCULAR | Status: AC | PRN
  Administered 2012-04-05: 80 mL via INTRAVENOUS

## 2012-07-26 ENCOUNTER — Encounter (HOSPITAL_COMMUNITY): Payer: Self-pay | Admitting: *Deleted

## 2012-07-26 ENCOUNTER — Emergency Department (HOSPITAL_COMMUNITY)
Admission: EM | Admit: 2012-07-26 | Discharge: 2012-07-27 | Disposition: A | Discharge status: Home / Self Care | Payer: Medicaid Other | Attending: Emergency Medicine | Admitting: Emergency Medicine

## 2012-07-26 DIAGNOSIS — F419 Anxiety disorder, unspecified: Secondary | ICD-10-CM

## 2012-07-26 DIAGNOSIS — Z79899 Other long term (current) drug therapy: Secondary | ICD-10-CM | POA: Insufficient documentation

## 2012-07-26 DIAGNOSIS — IMO0002 Reserved for concepts with insufficient information to code with codable children: Secondary | ICD-10-CM | POA: Insufficient documentation

## 2012-07-26 DIAGNOSIS — Z8679 Personal history of other diseases of the circulatory system: Secondary | ICD-10-CM | POA: Insufficient documentation

## 2012-07-26 DIAGNOSIS — J449 Chronic obstructive pulmonary disease, unspecified: Secondary | ICD-10-CM | POA: Insufficient documentation

## 2012-07-26 DIAGNOSIS — J4489 Other specified chronic obstructive pulmonary disease: Secondary | ICD-10-CM | POA: Insufficient documentation

## 2012-07-26 DIAGNOSIS — M129 Arthropathy, unspecified: Secondary | ICD-10-CM | POA: Insufficient documentation

## 2012-07-26 DIAGNOSIS — F411 Generalized anxiety disorder: Secondary | ICD-10-CM | POA: Insufficient documentation

## 2012-07-26 DIAGNOSIS — E079 Disorder of thyroid, unspecified: Secondary | ICD-10-CM | POA: Insufficient documentation

## 2012-07-26 DIAGNOSIS — F17200 Nicotine dependence, unspecified, uncomplicated: Secondary | ICD-10-CM | POA: Insufficient documentation

## 2012-07-26 NOTE — ED Notes (Addendum)
Pt standing in the hallway. Informed pt he should wait in the room for the doctor. Pt stated he wanted to wait outside. Informed pt I would be going into another room to start an IV and requested for him to stay in his room. Pt stated, "Well, I need one, too. I need IV demerol and xanax." Informed pt that he needs to be in his room.

## 2012-07-26 NOTE — ED Notes (Signed)
Discussed with Dr. Norlene Campbell if I should start chest pain protocols. No further orders.
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
## 2012-07-26 NOTE — ED Notes (Signed)
Pt states he has chest pain from when he slipped on ice 5-7 days prior, fell on chest, left arm, right leg, left leg, and chin. Pt states "I got a doctor to see it. I think." Pt reports "I feel suicidal like I'm going to go crazy if I don't get any sleep." Pt last slept 4-5 days prior. When asked if pt was going to hurt himself, "Possibly." When asked if pt had plan he stated, "not really."

## 2012-07-27 MED ORDER — HYDROXYZINE HCL 50 MG/ML IM SOLN: 50.0000 mg | Freq: Once | INTRAMUSCULAR | Status: DC

## 2012-07-27 MED ORDER — HYDROXYZINE HCL 25 MG PO TABS
50.0000 mg | ORAL_TABLET | Freq: Once | ORAL | Status: AC
  Administered 2012-07-27: 50 mg via ORAL
  Filled 2012-07-27: qty 2

## 2012-07-27 NOTE — ED Notes (Signed)
Pt stating that vistaril does not work with him orally. Informed MD. Jovita Gamma pt the option to take the vistaril or not. Pt opted to take the vistaril. Informed him he will not be getting xanax or ativan.
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
## 2012-07-27 NOTE — ED Notes (Signed)
MD at bedside. 

## 2012-07-27 NOTE — ED Notes (Signed)
Pt taking cab home.  

## 2012-10-17 ENCOUNTER — Encounter (HOSPITAL_COMMUNITY): Payer: Self-pay

## 2012-10-17 ENCOUNTER — Emergency Department (HOSPITAL_COMMUNITY)
Admission: EM | Admit: 2012-10-17 | Discharge: 2012-10-17 | Disposition: A | Discharge status: Home / Self Care | Payer: Medicaid Other | Attending: Emergency Medicine | Admitting: Emergency Medicine

## 2012-10-17 DIAGNOSIS — J449 Chronic obstructive pulmonary disease, unspecified: Secondary | ICD-10-CM | POA: Insufficient documentation

## 2012-10-17 DIAGNOSIS — J45909 Unspecified asthma, uncomplicated: Secondary | ICD-10-CM | POA: Insufficient documentation

## 2012-10-17 DIAGNOSIS — F17200 Nicotine dependence, unspecified, uncomplicated: Secondary | ICD-10-CM | POA: Insufficient documentation

## 2012-10-17 DIAGNOSIS — L02619 Cutaneous abscess of unspecified foot: Secondary | ICD-10-CM | POA: Insufficient documentation

## 2012-10-17 DIAGNOSIS — R110 Nausea: Secondary | ICD-10-CM | POA: Insufficient documentation

## 2012-10-17 DIAGNOSIS — Z8739 Personal history of other diseases of the musculoskeletal system and connective tissue: Secondary | ICD-10-CM | POA: Insufficient documentation

## 2012-10-17 DIAGNOSIS — Z79899 Other long term (current) drug therapy: Secondary | ICD-10-CM | POA: Insufficient documentation

## 2012-10-17 DIAGNOSIS — E079 Disorder of thyroid, unspecified: Secondary | ICD-10-CM | POA: Insufficient documentation

## 2012-10-17 DIAGNOSIS — F411 Generalized anxiety disorder: Secondary | ICD-10-CM | POA: Insufficient documentation

## 2012-10-17 DIAGNOSIS — J4489 Other specified chronic obstructive pulmonary disease: Secondary | ICD-10-CM | POA: Insufficient documentation

## 2012-10-17 DIAGNOSIS — L089 Local infection of the skin and subcutaneous tissue, unspecified: Secondary | ICD-10-CM

## 2012-10-17 MED ORDER — DOXYCYCLINE HYCLATE 100 MG PO CAPS: 100.0000 mg | ORAL_CAPSULE | Freq: Two times a day (BID) | ORAL | Status: DC

## 2012-10-17 NOTE — ED Notes (Addendum)
Patient reports abscess on lateral right foot x 2 weeks. Patient reports that he has been squeezing the area and having clear liquid come out. Patient c/o nausea. Patient states his last BM was 2 days ago and took a stool softener x 2, but no results. Patient denies any abdominal pain.

## 2012-10-17 NOTE — ED Provider Notes (Signed)
History     CSN: [id number]  Arrival date & time 10/17/12  2130   First MD Initiated Contact with Patient 10/17/12 1851      Chief Complaint  Patient presents with  . Abscess  . Nausea    Patient is a 57 y.o. male presenting with abscess. The history is provided by the patient.  Abscess Location:  Foot Foot abscess location:  R foot Abscess quality: draining   Red streaking: no   Duration:  2 weeks Progression:  Worsening Chronicity:  New Context: not diabetes   Relieved by:  Draining/squeezing Worsened by:  Nothing tried Associated symptoms: nausea   Associated symptoms: no fever and no vomiting     Past Medical History  Diagnosis Date  . SOB (shortness of breath)   . Dizziness   . Anxiety   . Arthritis   . Asthma   . COPD (chronic obstructive pulmonary disease)   . Thyroid disease     History reviewed. No pertinent past surgical history.  Family History  Problem Relation Age of Onset  . Arrhythmia    . ALS    . Prostate cancer Maternal Grandfather   . Colon cancer Paternal Grandfather     History  Substance Use Topics  . Smoking status: Current Every Day Smoker -- 1.00 packs/day for 40 years    Types: Cigarettes  . Smokeless tobacco: Never Used  . Alcohol Use: Yes     Comment: social      Review of Systems  Constitutional: Negative for fever.  Gastrointestinal: Positive for nausea. Negative for vomiting.    Allergies  Codeine and Talwin  Home Medications   Current Outpatient Rx  Name  Route  Sig  Dispense  Refill  . albuterol (PROVENTIL HFA;VENTOLIN HFA) 108 (90 BASE) MCG/ACT inhaler   Inhalation   Inhale 2 puffs into the lungs every 6 (six) hours as needed for wheezing or shortness of breath.          . ALPRAZolam (XANAX) 1 MG tablet   Oral   Take 1-2 mg by mouth 3 (three) times daily. 1 mg twice a day, and 2 mg at bedtime         . budesonide-formoterol (SYMBICORT) 160-4.5 MCG/ACT inhaler   Inhalation   Inhale 2 puffs into  the lungs 2 (two) times daily.         . diphenhydrAMINE (BENADRYL) 50 MG capsule   Oral   Take 300 mg by mouth at bedtime as needed for sleep.         Marland Kitchen levothyroxine (SYNTHROID, LEVOTHROID) 150 MCG tablet   Oral   Take 150 mcg by mouth every morning.          . pantoprazole (PROTONIX) 40 MG tablet   Oral   Take 40 mg by mouth every morning.          . risperiDONE (RISPERDAL) 2 MG tablet   Oral   Take 2 mg by mouth 2 (two) times daily.         Marland Kitchen doxycycline (VIBRAMYCIN) 100 MG capsule   Oral   Take 1 capsule (100 mg total) by mouth 2 (two) times daily.   14 capsule   0     BP 137/92  Pulse 86  Temp(Src) 98.9 F (37.2 C) (Oral)  Resp 16  Ht 5\' 7"  (1.702 m)  Wt 158 lb (71.668 kg)  BMI 24.74 kg/m2  SpO2 98%  Physical Exam CONSTITUTIONAL: Well developed/well nourished HEAD: Normocephalic/atraumatic EYES:  EOMI/PERRL ENMT: Mucous membranes moist NECK: supple no meningeal signs CV: S1/S2 noted, no murmurs/rubs/gallops noted LUNGS: Lungs are clear to auscultation bilaterally, no apparent distress ABDOMEN: soft, nontender, no rebound or guarding NEURO: Pt is awake/alert, moves all extremitiesx4, no focal motor weakness, gait without ataxia EXTREMITIES: pulses normal, full ROM Small pustule on right foot on medial surface no active draining.  No bleeding noted.  No crepitance.  No fluctuance No puncture wounds noted SKIN: warm, color normal PSYCH: no abnormalities of mood noted   ED Course  Procedures   1. Skin infection       MDM  Nursing notes including past medical history and social history reviewed and considered in documentation   Pt well appearing, reports recent full lab eval by PCP does not request any blood work here He fears he has MRSA, will start doxycycline, lesion not amenable to surgical drainage He reports nausea but no vomiting and no abd tenderness        Joya Gaskins, MD 10/17/12 2026

## 2012-11-02 ENCOUNTER — Encounter (HOSPITAL_COMMUNITY): Payer: Self-pay | Admitting: Emergency Medicine

## 2012-11-02 ENCOUNTER — Emergency Department (HOSPITAL_COMMUNITY)
Admission: EM | Admit: 2012-11-02 | Discharge: 2012-11-02 | Disposition: A | Discharge status: Home / Self Care | Payer: Medicaid Other | Attending: Emergency Medicine | Admitting: Emergency Medicine

## 2012-11-02 DIAGNOSIS — E079 Disorder of thyroid, unspecified: Secondary | ICD-10-CM | POA: Insufficient documentation

## 2012-11-02 DIAGNOSIS — J441 Chronic obstructive pulmonary disease with (acute) exacerbation: Secondary | ICD-10-CM | POA: Insufficient documentation

## 2012-11-02 DIAGNOSIS — F410 Panic disorder [episodic paroxysmal anxiety] without agoraphobia: Secondary | ICD-10-CM | POA: Insufficient documentation

## 2012-11-02 DIAGNOSIS — Z8739 Personal history of other diseases of the musculoskeletal system and connective tissue: Secondary | ICD-10-CM | POA: Insufficient documentation

## 2012-11-02 DIAGNOSIS — F17200 Nicotine dependence, unspecified, uncomplicated: Secondary | ICD-10-CM | POA: Insufficient documentation

## 2012-11-02 DIAGNOSIS — Z79899 Other long term (current) drug therapy: Secondary | ICD-10-CM | POA: Insufficient documentation

## 2012-11-02 DIAGNOSIS — F419 Anxiety disorder, unspecified: Secondary | ICD-10-CM

## 2012-11-02 MED ORDER — ALPRAZOLAM 0.5 MG PO TABS
2.0000 mg | ORAL_TABLET | Freq: Once | ORAL | Status: AC
  Administered 2012-11-02: 2 mg via ORAL
  Filled 2012-11-02: qty 4

## 2012-11-02 MED ORDER — ALPRAZOLAM 1 MG PO TABS: ORAL_TABLET | ORAL | Status: DC

## 2012-11-02 NOTE — ED Notes (Signed)
States that he has been having an anxiety attack for the past couple of days. States that he takes xanax and is currently out of his medication

## 2012-11-02 NOTE — ED Provider Notes (Signed)
History    CSN: [id number] Arrival date & time 11/02/12  0902  First MD Initiated Contact with Patient 11/02/12 0914     Chief Complaint  Patient presents with  . Panic Attack   (Consider location/radiation/quality/duration/timing/severity/associated sxs/prior Treatment) Patient is a 57 y.o. male presenting with anxiety. The history is provided by the patient (the pt complains of being out of his xanax and is anxious). No language interpreter was used.  Anxiety This is a new problem. The current episode started 12 to 24 hours ago. The problem occurs constantly. The problem has not changed since onset.Pertinent negatives include no chest pain, no abdominal pain and no headaches. Nothing aggravates the symptoms. Nothing relieves the symptoms.   Past Medical History  Diagnosis Date  . SOB (shortness of breath)   . Dizziness   . Anxiety   . Arthritis   . Asthma   . COPD (chronic obstructive pulmonary disease)   . Thyroid disease    History reviewed. No pertinent past surgical history. Family History  Problem Relation Age of Onset  . Arrhythmia    . ALS    . Prostate cancer Maternal Grandfather   . Colon cancer Paternal Grandfather    History  Substance Use Topics  . Smoking status: Current Every Day Smoker -- 1.00 packs/day for 40 years    Types: Cigarettes  . Smokeless tobacco: Never Used  . Alcohol Use: Yes     Comment: social    Review of Systems  Constitutional: Negative for appetite change and fatigue.  HENT: Negative for congestion, sinus pressure and ear discharge.   Eyes: Negative for discharge.  Respiratory: Negative for cough.   Cardiovascular: Negative for chest pain.  Gastrointestinal: Negative for abdominal pain and diarrhea.  Genitourinary: Negative for frequency and hematuria.  Musculoskeletal: Negative for back pain.  Skin: Negative for rash.  Neurological: Negative for seizures and headaches.       Anxiety  Psychiatric/Behavioral: Negative for  hallucinations.    Allergies  Codeine and Talwin  Home Medications   Current Outpatient Rx  Name  Route  Sig  Dispense  Refill  . albuterol (PROVENTIL HFA;VENTOLIN HFA) 108 (90 BASE) MCG/ACT inhaler   Inhalation   Inhale 2 puffs into the lungs every 6 (six) hours as needed for wheezing or shortness of breath.          . ALPRAZolam (XANAX) 1 MG tablet   Oral   Take 1-2 mg by mouth 3 (three) times daily. 1 mg twice a day, and 2 mg at bedtime         . budesonide-formoterol (SYMBICORT) 160-4.5 MCG/ACT inhaler   Inhalation   Inhale 2 puffs into the lungs 2 (two) times daily.         . diphenhydrAMINE (BENADRYL) 50 MG capsule   Oral   Take 300 mg by mouth at bedtime as needed for sleep.         Marland Kitchen levothyroxine (SYNTHROID, LEVOTHROID) 150 MCG tablet   Oral   Take 150 mcg by mouth every morning.          . pantoprazole (PROTONIX) 40 MG tablet   Oral   Take 40 mg by mouth every morning.          . risperiDONE (RISPERDAL) 2 MG tablet   Oral   Take 2 mg by mouth 2 (two) times daily.         Marland Kitchen ALPRAZolam (XANAX) 1 MG tablet      1mg  in  the morning and lunch and 2 mg at night   30 tablet   0   . doxycycline (VIBRAMYCIN) 100 MG capsule   Oral   Take 1 capsule (100 mg total) by mouth 2 (two) times daily.   14 capsule   0    BP 122/78  Pulse 101  Temp(Src) 98 F (36.7 C) (Oral)  Resp 16  SpO2 96% Physical Exam  Constitutional: He is oriented to person, place, and time. He appears well-developed.  HENT:  Head: Normocephalic.  Eyes: Conjunctivae and EOM are normal. No scleral icterus.  Neck: Neck supple. No thyromegaly present.  Cardiovascular: Normal rate and regular rhythm.  Exam reveals no gallop and no friction rub.   No murmur heard. Pulmonary/Chest: No stridor. He has no wheezes. He has no rales. He exhibits no tenderness.  Abdominal: He exhibits no distension. There is no tenderness. There is no rebound.  Musculoskeletal: Normal range of motion.  He exhibits no edema.  Lymphadenopathy:    He has no cervical adenopathy.  Neurological: He is oriented to person, place, and time. Coordination normal.  anxious  Skin: No rash noted. No erythema.  Psychiatric: He has a normal mood and affect. His behavior is normal.    ED Course  Procedures (including critical care time) Labs Reviewed - No data to display No results found. 1. Anxiety     MDM    Benny Lennert, MD 11/02/12 [phone]

## 2012-12-23 ENCOUNTER — Emergency Department (HOSPITAL_COMMUNITY)
Admission: EM | Admit: 2012-12-23 | Discharge: 2012-12-23 | Disposition: A | Discharge status: Home / Self Care | Payer: Medicaid Other | Attending: Emergency Medicine | Admitting: Emergency Medicine

## 2012-12-23 ENCOUNTER — Encounter (HOSPITAL_COMMUNITY): Payer: Self-pay | Admitting: Emergency Medicine

## 2012-12-23 DIAGNOSIS — Z79899 Other long term (current) drug therapy: Secondary | ICD-10-CM | POA: Insufficient documentation

## 2012-12-23 DIAGNOSIS — J449 Chronic obstructive pulmonary disease, unspecified: Secondary | ICD-10-CM | POA: Insufficient documentation

## 2012-12-23 DIAGNOSIS — E079 Disorder of thyroid, unspecified: Secondary | ICD-10-CM | POA: Insufficient documentation

## 2012-12-23 DIAGNOSIS — K429 Umbilical hernia without obstruction or gangrene: Secondary | ICD-10-CM | POA: Insufficient documentation

## 2012-12-23 DIAGNOSIS — F17200 Nicotine dependence, unspecified, uncomplicated: Secondary | ICD-10-CM | POA: Insufficient documentation

## 2012-12-23 DIAGNOSIS — Z8739 Personal history of other diseases of the musculoskeletal system and connective tissue: Secondary | ICD-10-CM | POA: Insufficient documentation

## 2012-12-23 DIAGNOSIS — J4489 Other specified chronic obstructive pulmonary disease: Secondary | ICD-10-CM | POA: Insufficient documentation

## 2012-12-23 DIAGNOSIS — IMO0002 Reserved for concepts with insufficient information to code with codable children: Secondary | ICD-10-CM | POA: Insufficient documentation

## 2012-12-23 MED ORDER — KETOROLAC TROMETHAMINE 30 MG/ML IJ SOLN
30.0000 mg | Freq: Once | INTRAMUSCULAR | Status: AC
  Administered 2012-12-23: 30 mg via INTRAVENOUS
  Filled 2012-12-23: qty 1
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
## 2012-12-23 NOTE — ED Notes (Signed)
Pt c/o severe pain, protrusion noted to umbilicus. Pt states he was dx with hernia, elected not to have surgery. Unable to reduce protrusion. Pt screaming with pain.

## 2012-12-26 ENCOUNTER — Ambulatory Visit (INDEPENDENT_AMBULATORY_CARE_PROVIDER_SITE_OTHER): Payer: Medicaid Other | Admitting: General Surgery

## 2012-12-26 ENCOUNTER — Encounter (INDEPENDENT_AMBULATORY_CARE_PROVIDER_SITE_OTHER): Payer: Self-pay | Admitting: General Surgery

## 2012-12-26 VITALS — BP 110/70 | HR 78 | Resp 18 | Ht 67.0 in | Wt 148.0 lb

## 2012-12-26 DIAGNOSIS — K429 Umbilical hernia without obstruction or gangrene: Secondary | ICD-10-CM | POA: Insufficient documentation

## 2012-12-26 NOTE — Progress Notes (Signed)
Patient ID: [name] [name], male   DOB: Sep 24, 1955, 57 y.o.   MRN: [id number]  Chief Complaint  Patient presents with  . New Evaluation    eval diaphramic hernia    HPI [name] [name] is a 57 y.o. male.  We're asked to see the patient in consultation by Dr. Benedetto Goad to evaluate him for an umbilical hernia. The patient is a 57 year old white male who has been experiencing stabbing pain that his belly button for the last year. He has noticed bulging with any type of straining at his bellybutton. He has had only an occasional episode of nausea and vomiting. His appetite is good and his bowels are working normally. The pain is occurring on a daily basis. He has been to the emergency department where he got some Percocet which helped with this pain. He also had a recent CT scan that showed the umbilical hernia but there was no visceral contents within the hernia  HPI  Past Medical History  Diagnosis Date  . SOB (shortness of breath)   . Dizziness   . Anxiety   . Arthritis   . Asthma   . COPD (chronic obstructive pulmonary disease)   . Thyroid disease     History reviewed. No pertinent past surgical history.  Family History  Problem Relation Age of Onset  . Arrhythmia    . ALS    . Prostate cancer Maternal Grandfather   . Colon cancer Paternal Grandfather     Social History History  Substance Use Topics  . Smoking status: Current Every Day Smoker -- 1.00 packs/day for 40 years    Types: Cigarettes  . Smokeless tobacco: Never Used  . Alcohol Use: Yes     Comment: social    Allergies  Allergen Reactions  . Codeine Nausea And Vomiting    Nausea/vomiting  . Talwin [Pentazocine] Other (See Comments)    Hallucination, shaking    Current Outpatient Prescriptions  Medication Sig Dispense Refill  . albuterol (PROVENTIL HFA;VENTOLIN HFA) 108 (90 BASE) MCG/ACT inhaler Inhale 2 puffs into the lungs every 6 (six) hours as needed for wheezing or shortness of breath.       .  ALPRAZolam (XANAX) 1 MG tablet Take 1 mg by mouth at bedtime as needed for sleep.      . budesonide-formoterol (SYMBICORT) 160-4.5 MCG/ACT inhaler Inhale 2 puffs into the lungs 2 (two) times daily.      Marland Kitchen levothyroxine (SYNTHROID, LEVOTHROID) 150 MCG tablet Take 150 mcg by mouth every morning.       . risperiDONE (RISPERDAL) 2 MG tablet Take 2 mg by mouth 2 (two) times daily.       No current facility-administered medications for this visit.    Review of Systems Review of Systems  Constitutional: Negative.   HENT: Negative.   Eyes: Negative.   Respiratory: Positive for cough.   Cardiovascular: Negative.   Gastrointestinal: Positive for abdominal pain.  Endocrine: Negative.   Genitourinary: Negative.   Musculoskeletal: Negative.   Skin: Negative.   Allergic/Immunologic: Negative.   Neurological: Negative.   Hematological: Negative.   Psychiatric/Behavioral: Negative.     Blood pressure 110/70, pulse 78, resp. rate 18, height 5\' 7"  (1.702 m), weight 148 lb (67.132 kg).  Physical Exam Physical Exam  Constitutional: He is oriented to person, place, and time. He appears well-developed and well-nourished.  HENT:  Head: Normocephalic and atraumatic.  Eyes: Conjunctivae and EOM are normal. Pupils are equal, round, and reactive to light.  Neck:  Normal range of motion. Neck supple.  Cardiovascular: Normal rate, regular rhythm and normal heart sounds.   Pulmonary/Chest: Effort normal and breath sounds normal.  Abdominal: Soft. Bowel sounds are normal.  There is a small moderate size umbilical hernia that reduces easily. It is painful to palpation.  Musculoskeletal: Normal range of motion.  Neurological: He is alert and oriented to person, place, and time.  Skin: Skin is warm and dry.  Psychiatric: He has a normal mood and affect. His behavior is normal.    Data Reviewed As above  Assessment    The patient has a small but symptomatic umbilical hernia. Because of the risk of  incarceration and stimulation and secured benefit from having this fixed. I have discussed with him in detail the risks and benefits of the operation to fix the hernia as well as some of the technical aspects including the use of mesh and the risk of injury to the small intestine and He wishes to proceed     Plan    Plan for umbilical hernia repair with mesh        TOTH III,PAUL S 12/26/2012, 9:39 AM

## 2012-12-26 NOTE — Patient Instructions (Signed)
Plan for umbilical hernia repair with mesh 

## 2012-12-28 NOTE — Pre-Procedure Instructions (Signed)
[name] [name]  12/28/2012   Your procedure is scheduled on:  01/01/2013  Report to [facility] at 5:30 AM.  Call this number if you have problems the morning of surgery: [phone]   Remember:   Do not eat food or drink liquids after midnight.   Take these medicines the morning of surgery with A SIP OF WATER: resperidone, alprazolam, inhaler, symbicort, synthroid   Do not wear jewelry  Do not wear lotions, powders, or perfumes. You may wear deodorant.  Do not shave 48 hours prior to surgery. Men may shave face and neck.  Do not bring valuables to the hospital.  [facility] is not responsible                   for any belongings or valuables.  Contacts, dentures or bridgework may not be worn into surgery.  Leave suitcase in the car. After surgery it may be brought to your room.  For patients admitted to the hospital, checkout time is 11:00 AM the day of  discharge.   Patients discharged the day of surgery will not be allowed to drive  home.  Name and phone number of your driver:   Special Instructions: Shower using CHG 2 nights before surgery and the night before surgery.  If you shower the day of surgery use CHG.  Use special wash - you have one bottle of CHG for all showers.  You should use approximately 1/3 of the bottle for each shower.   Please read over the following fact sheets that you were given: Pain Booklet, Coughing and Deep Breathing and Surgical Site Infection Prevention

## 2012-12-29 ENCOUNTER — Encounter (HOSPITAL_COMMUNITY)
Admission: RE | Admit: 2012-12-29 | Discharge: 2012-12-29 | Disposition: A | Discharge status: Home / Self Care | Payer: Medicaid Other | Source: Ambulatory Visit | Attending: General Surgery | Admitting: General Surgery

## 2012-12-29 ENCOUNTER — Encounter (HOSPITAL_COMMUNITY): Payer: Self-pay

## 2012-12-29 ENCOUNTER — Ambulatory Visit (HOSPITAL_COMMUNITY)
Admission: RE | Admit: 2012-12-29 | Discharge: 2012-12-29 | Disposition: A | Discharge status: Home / Self Care | Payer: Medicaid Other | Source: Ambulatory Visit | Attending: General Surgery | Admitting: General Surgery

## 2012-12-29 DIAGNOSIS — Z01812 Encounter for preprocedural laboratory examination: Secondary | ICD-10-CM | POA: Insufficient documentation

## 2012-12-29 DIAGNOSIS — Z01818 Encounter for other preprocedural examination: Secondary | ICD-10-CM | POA: Insufficient documentation

## 2012-12-29 DIAGNOSIS — J438 Other emphysema: Secondary | ICD-10-CM | POA: Insufficient documentation

## 2012-12-29 HISTORY — DX: Hypothyroidism, unspecified: E03.9

## 2012-12-29 LAB — BASIC METABOLIC PANEL
Chloride: 107 mEq/L (ref 96–112)
Creatinine, Ser: 1.02 mg/dL (ref 0.50–1.35)
GFR calc Af Amer: >90 mL/min (ref >90)
Potassium: 4.3 mEq/L (ref 3.5–5.1)
Sodium: 143 mEq/L (ref 135–145)

## 2012-12-29 LAB — CBC
HCT: 48.4 % (ref 39.0–52.0)
RBC: 5.26 MIL/uL (ref 4.22–5.81)
RDW: 13.9 % (ref 11.5–15.5)
WBC: 12 10*3/uL — ABNORMAL HIGH (ref 4.0–10.5)

## 2012-12-29 MED ORDER — CHLORHEXIDINE GLUCONATE 4 % EX LIQD: 1.0000 "application " | Freq: Once | CUTANEOUS | Status: DC

## 2012-12-29 NOTE — Pre-Procedure Instructions (Signed)
[name] [name]  12/29/2012   Your procedure is scheduled on: Monday January 01, 2013  Report to [facility] at 0530 AM.  Call this number if you have problems the morning of surgery: [phone]   Remember:   Do not eat food or drink liquids after midnight.   Take these medicines the morning of surgery with A SIP OF WATER: Synthroid, Risperidone, and use and bring inhalers .   Do not wear jewelry.  Do not wear lotions, or powder. You may wear deodorant.   Men may shave face and neck.  Do not bring valuables to the hospital.  [facility] is not responsible for any belongings or valuables.  Contacts, dentures or bridgework may not be worn into surgery.  Leave suitcase in the car. After surgery it may be brought to your room.  For patients admitted to the hospital, checkout time is 11:00 AM the day of discharge.   Patients discharged the day of surgery will not be allowed to drive home.    Special Instructions: Shower using CHG 2 nights before surgery and the night before surgery.  If you shower the day of surgery use CHG.  Use special wash - you have one bottle of CHG for all showers.  You should use approximately 1/3 of the bottle for each shower.   Please read over the following fact sheets that you were given: Pain Booklet, Coughing and Deep Breathing and Surgical Site Infection Prevention

## 2012-12-31 ENCOUNTER — Encounter (HOSPITAL_COMMUNITY): Payer: Self-pay | Admitting: *Deleted

## 2012-12-31 MED ORDER — CEFAZOLIN SODIUM-DEXTROSE 2-3 GM-% IV SOLR
2.0000 g | INTRAVENOUS | Status: AC
  Administered 2013-01-01: 2 g via INTRAVENOUS
  Filled 2012-12-31: qty 50

## 2012-12-31 MED ORDER — CHLORHEXIDINE GLUCONATE 4 % EX LIQD: 1.0000 "application " | Freq: Once | CUTANEOUS | Status: DC

## 2013-01-01 ENCOUNTER — Ambulatory Visit (HOSPITAL_COMMUNITY)
Admission: RE | Admit: 2013-01-01 | Discharge: 2013-01-01 | Disposition: A | Discharge status: Home / Self Care | Payer: Medicaid Other | Source: Ambulatory Visit | Attending: General Surgery | Admitting: General Surgery

## 2013-01-01 ENCOUNTER — Ambulatory Visit (HOSPITAL_COMMUNITY): Payer: Medicaid Other | Admitting: *Deleted

## 2013-01-01 ENCOUNTER — Encounter (HOSPITAL_COMMUNITY): Payer: Self-pay | Admitting: *Deleted

## 2013-01-01 ENCOUNTER — Ambulatory Visit (INDEPENDENT_AMBULATORY_CARE_PROVIDER_SITE_OTHER): Payer: Medicaid Other | Admitting: General Surgery

## 2013-01-01 ENCOUNTER — Encounter (HOSPITAL_COMMUNITY): Payer: Self-pay | Admitting: Anesthesiology

## 2013-01-01 ENCOUNTER — Encounter (HOSPITAL_COMMUNITY)
Admission: RE | Disposition: A | Discharge status: Home / Self Care | Payer: Self-pay | Source: Ambulatory Visit | Attending: General Surgery

## 2013-01-01 DIAGNOSIS — J4489 Other specified chronic obstructive pulmonary disease: Secondary | ICD-10-CM | POA: Insufficient documentation

## 2013-01-01 DIAGNOSIS — J449 Chronic obstructive pulmonary disease, unspecified: Secondary | ICD-10-CM | POA: Insufficient documentation

## 2013-01-01 DIAGNOSIS — K429 Umbilical hernia without obstruction or gangrene: Secondary | ICD-10-CM

## 2013-01-01 HISTORY — PX: INSERTION OF MESH: SHX5868

## 2013-01-01 HISTORY — PX: UMBILICAL HERNIA REPAIR: SHX196

## 2013-01-01 SURGERY — REPAIR, HERNIA, UMBILICAL, ADULT
Anesthesia: General | Site: Abdomen | Wound class: Clean

## 2013-01-01 MED ORDER — HYDROMORPHONE HCL PF 1 MG/ML IJ SOLN
0.2500 mg | INTRAMUSCULAR | Status: DC | PRN
  Administered 2013-01-01 (×2): 0.5 mg via INTRAVENOUS

## 2013-01-01 MED ORDER — ROCURONIUM BROMIDE 100 MG/10ML IV SOLN
INTRAVENOUS | Status: DC | PRN
  Administered 2013-01-01: 40 mg via INTRAVENOUS

## 2013-01-01 MED ORDER — GLYCOPYRROLATE 0.2 MG/ML IJ SOLN
INTRAMUSCULAR | Status: DC | PRN
  Administered 2013-01-01: 0.6 mg via INTRAVENOUS

## 2013-01-01 MED ORDER — HYDROMORPHONE HCL PF 1 MG/ML IJ SOLN
INTRAMUSCULAR | Status: AC
  Administered 2013-01-01: 0.5 mg via INTRAVENOUS
  Filled 2013-01-01: qty 1

## 2013-01-01 MED ORDER — LIDOCAINE HCL (CARDIAC) 20 MG/ML IV SOLN
INTRAVENOUS | Status: DC | PRN
  Administered 2013-01-01: 40 mg via INTRAVENOUS

## 2013-01-01 MED ORDER — DEXAMETHASONE SODIUM PHOSPHATE 4 MG/ML IJ SOLN
INTRAMUSCULAR | Status: DC | PRN
  Administered 2013-01-01: 8 mg via INTRAVENOUS

## 2013-01-01 MED ORDER — ONDANSETRON HCL 4 MG/2ML IJ SOLN
INTRAMUSCULAR | Status: DC | PRN
  Administered 2013-01-01: 4 mg via INTRAVENOUS

## 2013-01-01 MED ORDER — FENTANYL CITRATE 0.05 MG/ML IJ SOLN
INTRAMUSCULAR | Status: DC | PRN
  Administered 2013-01-01 (×2): 50 ug via INTRAVENOUS
  Administered 2013-01-01: 100 ug via INTRAVENOUS
  Administered 2013-01-01: 50 ug via INTRAVENOUS

## 2013-01-01 MED ORDER — BUPIVACAINE-EPINEPHRINE 0.25% -1:200000 IJ SOLN
INTRAMUSCULAR | Status: DC | PRN
  Administered 2013-01-01: 10 mL

## 2013-01-01 MED ORDER — LACTATED RINGERS IV SOLN
INTRAVENOUS | Status: DC | PRN
  Administered 2013-01-01 (×2): via INTRAVENOUS

## 2013-01-01 MED ORDER — METOCLOPRAMIDE HCL 5 MG/ML IJ SOLN
INTRAMUSCULAR | Status: DC | PRN
  Administered 2013-01-01: 10 mg via INTRAVENOUS

## 2013-01-01 MED ORDER — MIDAZOLAM HCL 5 MG/5ML IJ SOLN
INTRAMUSCULAR | Status: DC | PRN
  Administered 2013-01-01: 2 mg via INTRAVENOUS

## 2013-01-01 MED ORDER — OXYCODONE HCL 5 MG/5ML PO SOLN: 5.0000 mg | Freq: Once | ORAL | Status: AC | PRN

## 2013-01-01 MED ORDER — OXYCODONE HCL 5 MG PO TABS
5.0000 mg | ORAL_TABLET | Freq: Once | ORAL | Status: AC | PRN
  Administered 2013-01-01: 5 mg via ORAL

## 2013-01-01 MED ORDER — OXYCODONE HCL 5 MG PO TABS
ORAL_TABLET | ORAL | Status: AC
  Filled 2013-01-01: qty 1

## 2013-01-01 MED ORDER — PROPOFOL 10 MG/ML IV BOLUS
INTRAVENOUS | Status: DC | PRN
  Administered 2013-01-01: 200 mg via INTRAVENOUS

## 2013-01-01 MED ORDER — EPHEDRINE SULFATE 50 MG/ML IJ SOLN
INTRAMUSCULAR | Status: DC | PRN
  Administered 2013-01-01: 10 mg via INTRAVENOUS

## 2013-01-01 MED ORDER — BUPIVACAINE-EPINEPHRINE PF 0.25-1:200000 % IJ SOLN
INTRAMUSCULAR | Status: AC
  Filled 2013-01-01: qty 30

## 2013-01-01 MED ORDER — 0.9 % SODIUM CHLORIDE (POUR BTL) OPTIME
TOPICAL | Status: DC | PRN
  Administered 2013-01-01: 1000 mL

## 2013-01-01 MED ORDER — HYDROCODONE-ACETAMINOPHEN 5-325 MG PO TABS: 1.0000 | ORAL_TABLET | ORAL | Status: DC | PRN

## 2013-01-01 MED ORDER — METOCLOPRAMIDE HCL 5 MG/ML IJ SOLN: 10.0000 mg | Freq: Once | INTRAMUSCULAR | Status: DC | PRN

## 2013-01-01 MED ORDER — NEOSTIGMINE METHYLSULFATE 1 MG/ML IJ SOLN
INTRAMUSCULAR | Status: DC | PRN
  Administered 2013-01-01: 5 mg via INTRAVENOUS

## 2013-01-01 SURGICAL SUPPLY — 51 items
ADH SKN CLS APL DERMABOND .7 (GAUZE/BANDAGES/DRESSINGS) ×1
BALL CTTN LRG ABS STRL LF (GAUZE/BANDAGES/DRESSINGS)
BLADE SURG 10 STRL SS (BLADE) ×2 IMPLANT
BLADE SURG 15 STRL LF DISP TIS (BLADE) ×1 IMPLANT
BLADE SURG 15 STRL SS (BLADE) ×2
BLADE SURG ROTATE 9660 (MISCELLANEOUS) IMPLANT
CHLORAPREP W/TINT 26ML (MISCELLANEOUS) ×2 IMPLANT
CLOTH BEACON ORANGE TIMEOUT ST (SAFETY) ×2 IMPLANT
COTTONBALL LRG STERILE PKG (GAUZE/BANDAGES/DRESSINGS) IMPLANT
COVER SURGICAL LIGHT HANDLE (MISCELLANEOUS) ×2 IMPLANT
DECANTER SPIKE VIAL GLASS SM (MISCELLANEOUS) ×2 IMPLANT
DERMABOND ADVANCED (GAUZE/BANDAGES/DRESSINGS) ×1
DERMABOND ADVANCED .7 DNX12 (GAUZE/BANDAGES/DRESSINGS) ×1 IMPLANT
DRAPE LAPAROSCOPIC ABDOMINAL (DRAPES) ×2 IMPLANT
DRAPE UTILITY 15X26 W/TAPE STR (DRAPE) ×4 IMPLANT
ELECT CAUTERY BLADE 6.4 (BLADE) ×2 IMPLANT
ELECT REM PT RETURN 9FT ADLT (ELECTROSURGICAL) ×2
ELECTRODE REM PT RTRN 9FT ADLT (ELECTROSURGICAL) ×1 IMPLANT
GAUZE SPONGE 4X4 16PLY XRAY LF (GAUZE/BANDAGES/DRESSINGS) ×2 IMPLANT
GLOVE BIO SURGEON STRL SZ7.5 (GLOVE) ×2 IMPLANT
GLOVE BIOGEL PI IND STRL 6 (GLOVE) IMPLANT
GLOVE BIOGEL PI IND STRL 7.0 (GLOVE) IMPLANT
GLOVE BIOGEL PI INDICATOR 6 (GLOVE) ×1
GLOVE BIOGEL PI INDICATOR 7.0 (GLOVE) ×1
GLOVE SS BIOGEL STRL SZ 6.5 (GLOVE) IMPLANT
GLOVE SUPERSENSE BIOGEL SZ 6.5 (GLOVE) ×1
GLOVE SURG SS PI 6.5 STRL IVOR (GLOVE) ×1 IMPLANT
GOWN STRL NON-REIN LRG LVL3 (GOWN DISPOSABLE) ×4 IMPLANT
KIT BASIN OR (CUSTOM PROCEDURE TRAY) ×2 IMPLANT
KIT ROOM TURNOVER OR (KITS) ×2 IMPLANT
MESH VENTRALEX ST 1-7/10 CRC S (Mesh General) ×1 IMPLANT
NDL HYPO 25GX1X1/2 BEV (NEEDLE) ×1 IMPLANT
NEEDLE HYPO 25GX1X1/2 BEV (NEEDLE) ×2 IMPLANT
NS IRRIG 1000ML POUR BTL (IV SOLUTION) ×2 IMPLANT
PACK SURGICAL SETUP 50X90 (CUSTOM PROCEDURE TRAY) ×2 IMPLANT
PAD ARMBOARD 7.5X6 YLW CONV (MISCELLANEOUS) ×4 IMPLANT
PENCIL BUTTON HOLSTER BLD 10FT (ELECTRODE) ×2 IMPLANT
SPONGE GAUZE 4X4 12PLY (GAUZE/BANDAGES/DRESSINGS) IMPLANT
SPONGE LAP 18X18 X RAY DECT (DISPOSABLE) ×2 IMPLANT
SUT MNCRL AB 4-0 PS2 18 (SUTURE) ×2 IMPLANT
SUT NOVA NAB DX-16 0-1 5-0 T12 (SUTURE) ×2 IMPLANT
SUT PROLENE 0 CT 1 CR/8 (SUTURE) IMPLANT
SUT VIC AB 2-0 SH 27 (SUTURE) ×2
SUT VIC AB 2-0 SH 27X BRD (SUTURE) ×1 IMPLANT
SUT VIC AB 3-0 SH 27 (SUTURE) ×2
SUT VIC AB 3-0 SH 27XBRD (SUTURE) ×1 IMPLANT
SYR BULB 3OZ (MISCELLANEOUS) ×2 IMPLANT
SYR CONTROL 10ML LL (SYRINGE) ×2 IMPLANT
TOWEL OR 17X24 6PK STRL BLUE (TOWEL DISPOSABLE) ×2 IMPLANT
TOWEL OR 17X26 10 PK STRL BLUE (TOWEL DISPOSABLE) ×2 IMPLANT
WATER STERILE IRR 1000ML POUR (IV SOLUTION) IMPLANT

## 2013-01-01 NOTE — Interval H&P Note (Signed)
History and Physical Interval Note:  01/01/2013 7:05 AM  [name] [name]  has presented today for surgery, with the diagnosis of umbilical hernia  The various methods of treatment have been discussed with the patient and family. After consideration of risks, benefits and other options for treatment, the patient has consented to  Procedure(s): HERNIA REPAIR UMBILICAL ADULT (N/A) INSERTION OF MESH (N/A) as a surgical intervention .  The patient's history has been reviewed, patient examined, no change in status, stable for surgery.  I have reviewed the patient's chart and labs.  Questions were answered to the patient's satisfaction.     TOTH III,PAUL S

## 2013-01-01 NOTE — Anesthesia Preprocedure Evaluation (Addendum)
Anesthesia Evaluation  Patient identified by MRN, date of birth, ID band Patient awake    Reviewed: Allergy & Precautions, H&P , NPO status , Patient's Chart, lab work & pertinent test results, reviewed documented beta blocker date and time   Airway Mallampati: II TM Distance: >3 FB Neck ROM: full    Dental   Pulmonary shortness of breath and with exertion, asthma , COPD COPD inhaler, Current Smoker,  breath sounds clear to auscultation        Cardiovascular negative cardio ROS  Rhythm:regular     Neuro/Psych negative neurological ROS  negative psych ROS   GI/Hepatic Neg liver ROS, GERD-  Medicated and Controlled,  Endo/Other  Hypothyroidism   Renal/GU negative Renal ROS  negative genitourinary   Musculoskeletal   Abdominal   Peds  Hematology negative hematology ROS (+)   Anesthesia Other Findings See surgeon's H&P   Reproductive/Obstetrics negative OB ROS                           Anesthesia Physical Anesthesia Plan  ASA: III  Anesthesia Plan: General   Post-op Pain Management:    Induction: Intravenous  Airway Management Planned: Oral ETT  Additional Equipment:   Intra-op Plan:   Post-operative Plan: Extubation in OR  Informed Consent: I have reviewed the patients History and Physical, chart, labs and discussed the procedure including the risks, benefits and alternatives for the proposed anesthesia with the patient or authorized representative who has indicated his/her understanding and acceptance.   Dental Advisory Given  Plan Discussed with: CRNA and Surgeon  Anesthesia Plan Comments:         Anesthesia Quick Evaluation

## 2013-01-01 NOTE — Preoperative (Signed)
Beta Blockers   Reason not to administer Beta Blockers:Not Applicable 

## 2013-01-01 NOTE — Anesthesia Postprocedure Evaluation (Signed)
Anesthesia Post Note  Patient: [name] [name]  Procedure(s) Performed: Procedure(s) (LRB): HERNIA REPAIR UMBILICAL ADULT (N/A) INSERTION OF MESH (N/A)  Anesthesia type: General  Patient location: PACU  Post pain: Pain level controlled  Post assessment: Patient's Cardiovascular Status Stable  Last Vitals:  Filed Vitals:   01/01/13 0915  BP: 130/85  Pulse: 70  Temp:   Resp: 16    Post vital signs: Reviewed and stable  Level of consciousness: alert  Complications: No apparent anesthesia complications

## 2013-01-01 NOTE — Op Note (Signed)
01/01/2013  8:18 AM  PATIENT:  [name] [name]  57 y.o. male  PRE-OPERATIVE DIAGNOSIS:  umbilical hernia  POST-OPERATIVE DIAGNOSIS:  umbilical hernia  PROCEDURE:  Procedure(s): HERNIA REPAIR UMBILICAL ADULT (N/A) INSERTION OF MESH (N/A)  SURGEON:  Surgeon(s) and Role:    * Robyne Askew, MD - Primary  PHYSICIAN ASSISTANT:   ASSISTANTS: none   ANESTHESIA:   general  EBL:  Total I/O In: 1300 [I.V.:1300] Out: 10 [Blood:10]  BLOOD ADMINISTERED:none  DRAINS: none   LOCAL MEDICATIONS USED:  MARCAINE     SPECIMEN:  No Specimen  DISPOSITION OF SPECIMEN:  N/A  COUNTS:  YES  TOURNIQUET:  * No tourniquets in log *  DICTATION: .Dragon Dictation After informed consent was obtained the patient was brought to the operating room and placed in the supine position on the operating room table. After adequate induction of general anesthesia the patient's abdomen was prepped with ChloraPrep, allowed to dry, and draped in usual sterile manner. The area around the umbilicus was infiltrated with quarter percent Marcaine. A small supraumbilical transversely oriented incision was made with a 15 blade knife. This incision was carried through the skin and subcutaneous tissue sharply with the electrocautery. The hernia sac was identified. The hernia sac was gently separated from the rest of the subcutaneous tissue sharply with the electrocautery. Once this was accomplished the hernia sac was easily reduced. The fascial edges appeared healthy. The peritoneum was never opened. A small Bard umbilical hernia repair patch was chosen. The mesh was placed through the fascial defect and then deployed against the inside of the abdominal wall. The fascial defect was then closed with interrupted #1 Novafil stitches incorporating the anchor of the mesh. The excess anchor was then trimmed away and discarded. Once this was accomplished the hernia defect appeared to be well repaired and the mesh was in good position.  The wound was. Copious amounts of saline. The deep layer the wound was closed with interrupted 2-0 Vicryl stitches. The skin was then closed with interrupted 4-0 Monocryl subcuticular stitches. Dermabond dressings were applied. The patient tolerated the procedure well. At the end of the case all needle sponge and instrument counts were correct. The patient was then awakened and taken to recovery in stable condition.  PLAN OF CARE: Discharge to home after PACU  PATIENT DISPOSITION:  PACU - hemodynamically stable.   Delay start of Pharmacological VTE agent (>24hrs) due to surgical blood loss or risk of bleeding: not applicable

## 2013-01-01 NOTE — H&P (View-Only) (Signed)
Patient ID: [name] [name], male   DOB: 12/29/1955, 57 y.o.   MRN: [id number]  Chief Complaint  Patient presents with  . New Evaluation    eval diaphramic hernia    HPI [name] [name] is a 57 y.o. male.  We're asked to see the patient in consultation by Dr. Fred Wilson to evaluate him for an umbilical hernia. The patient is a 57-year-old white male who has been experiencing stabbing pain that his belly button for the last year. He has noticed bulging with any type of straining at his bellybutton. He has had only an occasional episode of nausea and vomiting. His appetite is good and his bowels are working normally. The pain is occurring on a daily basis. He has been to the emergency department where he got some Percocet which helped with this pain. He also had a recent CT scan that showed the umbilical hernia but there was no visceral contents within the hernia  HPI  Past Medical History  Diagnosis Date  . SOB (shortness of breath)   . Dizziness   . Anxiety   . Arthritis   . Asthma   . COPD (chronic obstructive pulmonary disease)   . Thyroid disease     History reviewed. No pertinent past surgical history.  Family History  Problem Relation Age of Onset  . Arrhythmia    . ALS    . Prostate cancer Maternal Grandfather   . Colon cancer Paternal Grandfather     Social History History  Substance Use Topics  . Smoking status: Current Every Day Smoker -- 1.00 packs/day for 40 years    Types: Cigarettes  . Smokeless tobacco: Never Used  . Alcohol Use: Yes     Comment: social    Allergies  Allergen Reactions  . Codeine Nausea And Vomiting    Nausea/vomiting  . Talwin [Pentazocine] Other (See Comments)    Hallucination, shaking    Current Outpatient Prescriptions  Medication Sig Dispense Refill  . albuterol (PROVENTIL HFA;VENTOLIN HFA) 108 (90 BASE) MCG/ACT inhaler Inhale 2 puffs into the lungs every 6 (six) hours as needed for wheezing or shortness of breath.       .  ALPRAZolam (XANAX) 1 MG tablet Take 1 mg by mouth at bedtime as needed for sleep.      . budesonide-formoterol (SYMBICORT) 160-4.5 MCG/ACT inhaler Inhale 2 puffs into the lungs 2 (two) times daily.      . levothyroxine (SYNTHROID, LEVOTHROID) 150 MCG tablet Take 150 mcg by mouth every morning.       . risperiDONE (RISPERDAL) 2 MG tablet Take 2 mg by mouth 2 (two) times daily.       No current facility-administered medications for this visit.    Review of Systems Review of Systems  Constitutional: Negative.   HENT: Negative.   Eyes: Negative.   Respiratory: Positive for cough.   Cardiovascular: Negative.   Gastrointestinal: Positive for abdominal pain.  Endocrine: Negative.   Genitourinary: Negative.   Musculoskeletal: Negative.   Skin: Negative.   Allergic/Immunologic: Negative.   Neurological: Negative.   Hematological: Negative.   Psychiatric/Behavioral: Negative.     Blood pressure 110/70, pulse 78, resp. rate 18, height 5' 7" (1.702 m), weight 148 lb (67.132 kg).  Physical Exam Physical Exam  Constitutional: He is oriented to person, place, and time. He appears well-developed and well-nourished.  HENT:  Head: Normocephalic and atraumatic.  Eyes: Conjunctivae and EOM are normal. Pupils are equal, round, and reactive to light.  Neck:   Normal range of motion. Neck supple.  Cardiovascular: Normal rate, regular rhythm and normal heart sounds.   Pulmonary/Chest: Effort normal and breath sounds normal.  Abdominal: Soft. Bowel sounds are normal.  There is a small moderate size umbilical hernia that reduces easily. It is painful to palpation.  Musculoskeletal: Normal range of motion.  Neurological: He is alert and oriented to person, place, and time.  Skin: Skin is warm and dry.  Psychiatric: He has a normal mood and affect. His behavior is normal.    Data Reviewed As above  Assessment    The patient has a small but symptomatic umbilical hernia. Because of the risk of  incarceration and stimulation and secured benefit from having this fixed. I have discussed with him in detail the risks and benefits of the operation to fix the hernia as well as some of the technical aspects including the use of mesh and the risk of injury to the small intestine and He wishes to proceed     Plan    Plan for umbilical hernia repair with mesh        TOTH III,PAUL S 12/26/2012, 9:39 AM    

## 2013-01-01 NOTE — Anesthesia Procedure Notes (Signed)
Procedure Name: Intubation Date/Time: 01/01/2013 7:52 AM Performed by: Brien Mates DOBSON Pre-anesthesia Checklist: Patient identified, Emergency Drugs available, Patient being monitored, Suction available and Timeout performed Patient Re-evaluated:Patient Re-evaluated prior to inductionOxygen Delivery Method: Circle system utilized Preoxygenation: Pre-oxygenation with 100% oxygen Intubation Type: IV induction Ventilation: Mask ventilation without difficulty Laryngoscope Size: 2 Grade View: Grade I Tube type: Oral Tube size: 7.5 mm Number of attempts: 1 Airway Equipment and Method: Stylet Placement Confirmation: ETT inserted through vocal cords under direct vision,  positive ETCO2 and breath sounds checked- equal and bilateral Secured at: 22 cm Tube secured with: Tape Dental Injury: Teeth and Oropharynx as per pre-operative assessment

## 2013-01-01 NOTE — Transfer of Care (Signed)
Immediate Anesthesia Transfer of Care Note  Patient: [name] [name]  Procedure(s) Performed: Procedure(s): HERNIA REPAIR UMBILICAL ADULT (N/A) INSERTION OF MESH (N/A)  Patient Location: PACU  Anesthesia Type:General  Level of Consciousness: awake, alert  and oriented  Airway & Oxygen Therapy: Patient Spontanous Breathing and Patient connected to face mask oxygen  Post-op Assessment: Report given to PACU RN and Post -op Vital signs reviewed and stable  Post vital signs: Reviewed and stable  Complications: No apparent anesthesia complications

## 2013-01-02 ENCOUNTER — Encounter (HOSPITAL_COMMUNITY): Payer: Self-pay | Admitting: General Surgery

## 2013-01-05 ENCOUNTER — Emergency Department (HOSPITAL_COMMUNITY): Payer: No Typology Code available for payment source

## 2013-01-05 ENCOUNTER — Encounter (HOSPITAL_COMMUNITY): Payer: Self-pay | Admitting: Emergency Medicine

## 2013-01-05 ENCOUNTER — Emergency Department (HOSPITAL_COMMUNITY)
Admission: EM | Admit: 2013-01-05 | Discharge: 2013-01-05 | Disposition: A | Discharge status: Home / Self Care | Payer: No Typology Code available for payment source | Attending: Emergency Medicine | Admitting: Emergency Medicine

## 2013-01-05 DIAGNOSIS — Y92410 Unspecified street and highway as the place of occurrence of the external cause: Secondary | ICD-10-CM | POA: Insufficient documentation

## 2013-01-05 DIAGNOSIS — Y9389 Activity, other specified: Secondary | ICD-10-CM | POA: Insufficient documentation

## 2013-01-05 DIAGNOSIS — J449 Chronic obstructive pulmonary disease, unspecified: Secondary | ICD-10-CM | POA: Insufficient documentation

## 2013-01-05 DIAGNOSIS — E039 Hypothyroidism, unspecified: Secondary | ICD-10-CM | POA: Insufficient documentation

## 2013-01-05 DIAGNOSIS — M129 Arthropathy, unspecified: Secondary | ICD-10-CM | POA: Insufficient documentation

## 2013-01-05 DIAGNOSIS — S39012A Strain of muscle, fascia and tendon of lower back, initial encounter: Secondary | ICD-10-CM

## 2013-01-05 DIAGNOSIS — G8918 Other acute postprocedural pain: Secondary | ICD-10-CM | POA: Insufficient documentation

## 2013-01-05 DIAGNOSIS — Z9889 Other specified postprocedural states: Secondary | ICD-10-CM | POA: Insufficient documentation

## 2013-01-05 DIAGNOSIS — F411 Generalized anxiety disorder: Secondary | ICD-10-CM | POA: Insufficient documentation

## 2013-01-05 DIAGNOSIS — S335XXA Sprain of ligaments of lumbar spine, initial encounter: Secondary | ICD-10-CM | POA: Insufficient documentation

## 2013-01-05 DIAGNOSIS — Z8709 Personal history of other diseases of the respiratory system: Secondary | ICD-10-CM | POA: Insufficient documentation

## 2013-01-05 DIAGNOSIS — Z79899 Other long term (current) drug therapy: Secondary | ICD-10-CM | POA: Insufficient documentation

## 2013-01-05 DIAGNOSIS — R109 Unspecified abdominal pain: Secondary | ICD-10-CM | POA: Insufficient documentation

## 2013-01-05 DIAGNOSIS — J4489 Other specified chronic obstructive pulmonary disease: Secondary | ICD-10-CM | POA: Insufficient documentation

## 2013-01-05 DIAGNOSIS — F17200 Nicotine dependence, unspecified, uncomplicated: Secondary | ICD-10-CM | POA: Insufficient documentation
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
## 2013-01-05 NOTE — ED Notes (Signed)
Per [facility]. Pt left prior to receiving discharge papers. Pt ambulatory
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
## 2013-01-08 ENCOUNTER — Emergency Department (HOSPITAL_COMMUNITY)
Admission: EM | Admit: 2013-01-08 | Discharge: 2013-01-08 | Disposition: A | Discharge status: Home / Self Care | Payer: Medicaid Other | Attending: Emergency Medicine | Admitting: Emergency Medicine

## 2013-01-08 ENCOUNTER — Encounter (HOSPITAL_COMMUNITY): Payer: Self-pay | Admitting: Emergency Medicine

## 2013-01-08 DIAGNOSIS — F17200 Nicotine dependence, unspecified, uncomplicated: Secondary | ICD-10-CM | POA: Insufficient documentation

## 2013-01-08 DIAGNOSIS — J4489 Other specified chronic obstructive pulmonary disease: Secondary | ICD-10-CM | POA: Insufficient documentation

## 2013-01-08 DIAGNOSIS — J449 Chronic obstructive pulmonary disease, unspecified: Secondary | ICD-10-CM | POA: Insufficient documentation

## 2013-01-08 DIAGNOSIS — E039 Hypothyroidism, unspecified: Secondary | ICD-10-CM | POA: Insufficient documentation

## 2013-01-08 DIAGNOSIS — Z79899 Other long term (current) drug therapy: Secondary | ICD-10-CM | POA: Insufficient documentation

## 2013-01-08 DIAGNOSIS — Z8739 Personal history of other diseases of the musculoskeletal system and connective tissue: Secondary | ICD-10-CM | POA: Insufficient documentation

## 2013-01-08 DIAGNOSIS — F419 Anxiety disorder, unspecified: Secondary | ICD-10-CM

## 2013-01-08 DIAGNOSIS — R000 Tachycardia, unspecified: Secondary | ICD-10-CM | POA: Insufficient documentation

## 2013-01-08 DIAGNOSIS — F411 Generalized anxiety disorder: Secondary | ICD-10-CM | POA: Insufficient documentation

## 2013-01-08 MED ORDER — LORAZEPAM 1 MG PO TABS: 1.0000 mg | ORAL_TABLET | Freq: Once | ORAL | Status: DC

## 2013-01-08 MED ORDER — LORAZEPAM 1 MG PO TABS
1.0000 mg | ORAL_TABLET | Freq: Once | ORAL | Status: AC
  Administered 2013-01-08: 1 mg via ORAL
  Filled 2013-01-08: qty 1

## 2013-01-08 NOTE — ED Notes (Signed)
Pt complains of "lightheadedness and racing heart rate since I stopped taking my hydrocodone" Pt has a history of anxiety and reports to not taken his medications for 2 days.

## 2013-01-08 NOTE — ED Notes (Signed)
Pt c/o dizziness and headache. Denies chest pain

## 2013-01-08 NOTE — ED Notes (Signed)
Pt refused to have lab work drawn. PA advised

## 2013-01-08 NOTE — ED Notes (Signed)
Patient refused to have his blood drawn. 

## 2013-01-08 NOTE — ED Provider Notes (Signed)
CSN: [id number]     Arrival date & time 01/08/13  1053 History   First MD Initiated Contact with Patient 01/08/13 1113     Chief Complaint  Patient presents with  . Dizziness   (Consider location/radiation/quality/duration/timing/severity/associated sxs/prior Treatment) The history is provided by the patient.   Patient presents to the ED for lightheadedness, racing heart beat, and irritability x 2 days.  Pt states he recently had surgery on his umbilical hernia on 01/01/13.  States he was given hydrocodone for pain but ran out 2 days ago.  Since then he has been very "fidgety", unable to sleep, and anxious.  States he has been smoking increased amounts of cigarettes since then to try to calm himself.  Pt has hx of anxiety and recurrent dizziness, currently out of his xanax and is not due for refill until Wednesday 01/10/13.  Denies any chest pain or SOB.  No recent seizure activity.  No abdominal pain, N/V/D.  Past Medical History  Diagnosis Date  . SOB (shortness of breath)   . Dizziness   . Anxiety   . Arthritis   . Asthma   . COPD (chronic obstructive pulmonary disease)   . Thyroid disease   . Hypothyroidism    Past Surgical History  Procedure Laterality Date  . Colonoscopy    . Upper gi endoscopy    . Umbilical hernia repair N/A 01/01/2013    Procedure: HERNIA REPAIR UMBILICAL ADULT;  Surgeon: Robyne Askew, MD;  Location: [facility] OR;  Service: General;  Laterality: N/A;  . Insertion of mesh N/A 01/01/2013    Procedure: INSERTION OF MESH;  Surgeon: Robyne Askew, MD;  Location: [facility] OR;  Service: General;  Laterality: N/A;   Family History  Problem Relation Age of Onset  . Arrhythmia    . ALS    . Prostate cancer Maternal Grandfather   . Colon cancer Paternal Grandfather    History  Substance Use Topics  . Smoking status: Current Every Day Smoker -- 1.00 packs/day for 40 years    Types: Cigarettes  . Smokeless tobacco: Never Used  . Alcohol Use: Yes     Comment: social     Review of Systems  Neurological: Positive for light-headedness.  Psychiatric/Behavioral: The patient is nervous/anxious.   All other systems reviewed and are negative.    Allergies  Codeine and Talwin  Home Medications   Current Outpatient Rx  Name  Route  Sig  Dispense  Refill  . albuterol (PROVENTIL HFA;VENTOLIN HFA) 108 (90 BASE) MCG/ACT inhaler   Inhalation   Inhale 2 puffs into the lungs every 6 (six) hours as needed for wheezing or shortness of breath.          . ALPRAZolam (XANAX) 1 MG tablet   Oral   Take 1-2 mg by mouth 3 (three) times daily as needed for anxiety.          . budesonide-formoterol (SYMBICORT) 160-4.5 MCG/ACT inhaler   Inhalation   Inhale 2 puffs into the lungs 2 (two) times daily as needed (For chronic obstructive pulmonary disease (COPD).).          Marland Kitchen levothyroxine (SYNTHROID, LEVOTHROID) 150 MCG tablet   Oral   Take 150 mcg by mouth every morning.          . risperiDONE (RISPERDAL) 2 MG tablet   Oral   Take 2 mg by mouth 2 (two) times daily.          BP 128/92  Pulse  97  Temp(Src) 97.9 F (36.6 C) (Oral)  Resp 22  SpO2 96%  Physical Exam  Nursing note and vitals reviewed. Constitutional: He is oriented to person, place, and time. He appears well-developed and well-nourished.  HENT:  Head: Normocephalic and atraumatic.  Mouth/Throat: Oropharynx is clear and moist.  Eyes: Conjunctivae and EOM are normal. Pupils are equal, round, and reactive to light.  Neck: Normal range of motion.  Cardiovascular: Normal rate, regular rhythm and normal heart sounds.   Pulmonary/Chest: Effort normal and breath sounds normal.  Abdominal: Soft. Bowel sounds are normal.  Umbilical incision healing well without signs of infection or hehiscence  Musculoskeletal: Normal range of motion.  Neurological: He is alert and oriented to person, place, and time. He has normal strength. He displays no tremor. No cranial nerve deficit or sensory deficit.  He displays no seizure activity. Gait normal.  No tremors or seizure activity, no focal neuro deficits appreciated, ambulating unassisted without difficulty  Skin: Skin is warm and dry.  Psychiatric: His speech is normal and behavior is normal. His mood appears anxious.  Appears somewhat anxious, speech normal    ED Course  Procedures (including critical care time)   Date: 01/08/2013  Rate: 75  Rhythm: normal sinus rhythm  QRS Axis: normal  Intervals: normal  ST/T Wave abnormalities: normal  Conduction Disutrbances:none  Narrative Interpretation: NSR, no STEMI  Old EKG Reviewed: unchanged   Labs Review Labs Reviewed - No data to display Imaging Review No results found.  MDM   1. Anxiety    EKG NSR, no acute ischemic changes.  Trop ordered but pt refused blood draw stating "i was just checked out by my cardiologist last month."  I have explained that those texts 1 month ago cannot rule out an acute cardiac event, pt again declines now stating "well i don't have time to be here all day." Do not believe that sx are related to withdrawal, likely anxiety. Pt given ativan PO in the ED.  Pt afebrile, non-toxic appearing, NAD, VS stable- ok for discharge.  FU with PCP on Wednesday as previously scheduled.  Discussed plan with pt, they agreed.  Return precautions advised.  Garlon Hatchet, PA-C 01/08/13 1224

## 2013-01-08 NOTE — ED Provider Notes (Signed)
Medical screening examination/treatment/procedure(s) were performed by non-physician practitioner and as supervising physician I was immediately available for consultation/collaboration.   Junius Argyle, MD 01/08/13 2004

## 2013-01-22 ENCOUNTER — Encounter (INDEPENDENT_AMBULATORY_CARE_PROVIDER_SITE_OTHER): Payer: Medicaid Other | Admitting: General Surgery

## 2014-01-03 ENCOUNTER — Emergency Department (HOSPITAL_COMMUNITY)
Admission: EM | Admit: 2014-01-03 | Discharge: 2014-01-03 | Disposition: A | Discharge status: Home / Self Care | Payer: Medicaid Other | Attending: Emergency Medicine | Admitting: Emergency Medicine

## 2014-01-03 DIAGNOSIS — Z9889 Other specified postprocedural states: Secondary | ICD-10-CM | POA: Diagnosis not present

## 2014-01-03 DIAGNOSIS — F17200 Nicotine dependence, unspecified, uncomplicated: Secondary | ICD-10-CM | POA: Insufficient documentation

## 2014-01-03 DIAGNOSIS — K429 Umbilical hernia without obstruction or gangrene: Secondary | ICD-10-CM | POA: Insufficient documentation

## 2014-01-03 DIAGNOSIS — Z79899 Other long term (current) drug therapy: Secondary | ICD-10-CM | POA: Diagnosis not present

## 2014-01-03 DIAGNOSIS — J45901 Unspecified asthma with (acute) exacerbation: Secondary | ICD-10-CM | POA: Diagnosis not present

## 2014-01-03 DIAGNOSIS — F411 Generalized anxiety disorder: Secondary | ICD-10-CM | POA: Diagnosis not present

## 2014-01-03 DIAGNOSIS — Z8739 Personal history of other diseases of the musculoskeletal system and connective tissue: Secondary | ICD-10-CM | POA: Insufficient documentation

## 2014-01-03 DIAGNOSIS — E039 Hypothyroidism, unspecified: Secondary | ICD-10-CM | POA: Diagnosis not present

## 2014-01-03 DIAGNOSIS — J441 Chronic obstructive pulmonary disease with (acute) exacerbation: Secondary | ICD-10-CM | POA: Insufficient documentation

## 2014-01-03 DIAGNOSIS — R1033 Periumbilical pain: Secondary | ICD-10-CM | POA: Insufficient documentation

## 2014-01-03 DIAGNOSIS — R300 Dysuria: Secondary | ICD-10-CM | POA: Diagnosis not present

## 2014-01-03 MED ORDER — IBUPROFEN 600 MG PO TABS: 600.0000 mg | ORAL_TABLET | Freq: Four times a day (QID) | ORAL | Status: DC | PRN

## 2014-01-03 NOTE — ED Notes (Signed)
Pt was seen walking down the hallway.  This RN asked if he wanted his Discharge paperwork, the Pt threw up his arms, and kept walking.  MD reports that the Pt is upset that he is not going to be given narcotic pain medication.

## 2014-01-03 NOTE — Progress Notes (Signed)
[place],  [place] with patient about community resources. Patient refused resources.

## 2014-01-03 NOTE — ED Notes (Signed)
Pt reports he had abdominal hernia surgery 6 months ago. Yesterday and today pt has been lifting furniture and such, now has stabbing pain at site of surgery. Pain 7/10. Dysuria x1 week.  Denies n/v/d.

## 2014-01-03 NOTE — ED Provider Notes (Signed)
CSN: [id number]     Arrival date & time 01/03/14  1318 History   First MD Initiated Contact with Patient 01/03/14 1321     No chief complaint on file.    (Consider location/radiation/quality/duration/timing/severity/associated sxs/prior Treatment) HPI The patient identifies the painful area right at his umbilicus where he had a prior umbilical hernia surgery approximately 6 months ago. Patient denies that he can recollect the exact name of the surgeon he believes it was a Dr. Philbert Riser. He reports that he had been doing some mild lifting and now severe stabbing pain in the umbilical area. He does however report that it is 'not popped out" now.  Past Medical History  Diagnosis Date  . SOB (shortness of breath)   . Dizziness   . Anxiety   . Arthritis   . Asthma   . COPD (chronic obstructive pulmonary disease)   . Thyroid disease   . Hypothyroidism    Past Surgical History  Procedure Laterality Date  . Colonoscopy    . Upper gi endoscopy    . Umbilical hernia repair N/A 01/01/2013    Procedure: HERNIA REPAIR UMBILICAL ADULT;  Surgeon: Merrie Roof, MD;  Location: [place];  Service: General;  Laterality: N/A;  . Insertion of mesh N/A 01/01/2013    Procedure: INSERTION OF MESH;  Surgeon: Merrie Roof, MD;  Location: [place];  Service: General;  Laterality: N/A;   Family History  Problem Relation Age of Onset  . Arrhythmia    . ALS    . Prostate cancer Maternal Grandfather   . Colon cancer Paternal Grandfather    History  Substance Use Topics  . Smoking status: Current Every Day Smoker -- 1.00 packs/day for 40 years    Types: Cigarettes  . Smokeless tobacco: Never Used  . Alcohol Use: Yes     Comment: social    Review of Systems  Constitutional: No recent fever chills or general illness Respiratory: No cough difficulty breathing or chest pain. Cardiovascular: No chest pain syncope weakness. Abdomen: Central stabbing abdominal pain in umbilcus as outlined no vomiting  diarrhea or loss of appetite GU: No pain burning or urgency with urination Skin: No rashes  Allergies  Codeine and Talwin  Home Medications   Prior to Admission medications   Medication Sig Start Date End Date Taking? Authorizing Provider  albuterol (PROVENTIL HFA;VENTOLIN HFA) 108 (90 BASE) MCG/ACT inhaler Inhale 2 puffs into the lungs every 6 (six) hours as needed for wheezing or shortness of breath.     Historical Provider, MD  ALPRAZolam Duanne Moron) 1 MG tablet Take 1-2 mg by mouth 3 (three) times daily as needed for anxiety.     Historical Provider, MD  budesonide-formoterol (SYMBICORT) 160-4.5 MCG/ACT inhaler Inhale 2 puffs into the lungs 2 (two) times daily as needed (For chronic obstructive pulmonary disease (COPD).).     Historical Provider, MD  levothyroxine (SYNTHROID, LEVOTHROID) 150 MCG tablet Take 150 mcg by mouth every morning.     Historical Provider, MD  risperiDONE (RISPERDAL) 2 MG tablet Take 2 mg by mouth 2 (two) times daily.    Historical Provider, MD   There were no vitals taken for this visit. Physical Exam  Constitutional: He is oriented to person, place, and time. He appears well-developed and well-nourished.  Patient does not appear to be any in any acute distress or pain. He does however smell of alcohol and appeared to have some degree of intoxication.  HENT:  Head: Normocephalic and atraumatic.  Eyes: EOM are normal. Pupils are equal, round, and reactive to light.  Cardiovascular: Normal rate, regular rhythm and normal heart sounds.   Pulmonary/Chest: Effort normal. He has wheezes.  Abdominal: Soft. He exhibits no distension and no mass. There is tenderness (although patient endorses tenderness to palpation in the umbilicus. To deep palpation I am unable to locate any hernia at this current time). There is no rebound and no guarding.  Musculoskeletal: Normal range of motion. He exhibits no edema.  Neurological: He is alert and oriented to person, place, and time.   Skin: Skin is warm and dry.     ED Course  Procedures (including critical care time) Labs Review Labs Reviewed - No data to display  Imaging Review No results found.   EKG Interpretation None      MDM   Final diagnoses:  Umbilical hernia without obstruction and without gangrene   Currently I do not identify a umbilical hernia. Patient does seem to be hostile at this point as I have reported he will be using either ibuprofen or Tylenol for pain control. He reports this will not work for him. I did however advise that without any objective findings of hernia or acute abdominal findings that other stronger pain medications such as narcotics were not appropriate at the current time. The patient is advised that he needs to followup with his surgeon for further evaluation regarding the surgical site and the potential for intermittent hernia protrusion.     Charlesetta Shanks, MD 01/03/14 1415

## 2014-01-04 ENCOUNTER — Telehealth (INDEPENDENT_AMBULATORY_CARE_PROVIDER_SITE_OTHER): Payer: Self-pay

## 2014-01-04 NOTE — Telephone Encounter (Signed)
Pt left msg on answering service. Per Dr Darrel Hoover request I returned pts call. Pt states his umbilical area is very sore now that ER MD "mashed on area". Pt advised we cannot give pain medication unless pt is seen. Pt request appt with Dr Marlou Starks to have area checked. Pt states area has been sore for a long time. No bulge. No redness. Per Er note no hernia on exam. Pt offered appt but will need to have his PCP make appt since he has [place] [employer]. Pt states he understands and will call their office for referral.

## 2014-02-04 ENCOUNTER — Emergency Department (HOSPITAL_COMMUNITY): Payer: Medicaid Other

## 2014-02-04 ENCOUNTER — Emergency Department (HOSPITAL_COMMUNITY)
Admission: EM | Admit: 2014-02-04 | Discharge: 2014-02-04 | Disposition: A | Discharge status: Home / Self Care | Payer: Medicaid Other | Attending: Emergency Medicine | Admitting: Emergency Medicine

## 2014-02-04 ENCOUNTER — Encounter (HOSPITAL_COMMUNITY): Payer: Self-pay | Admitting: Emergency Medicine

## 2014-02-04 DIAGNOSIS — R110 Nausea: Secondary | ICD-10-CM | POA: Diagnosis not present

## 2014-02-04 DIAGNOSIS — F17200 Nicotine dependence, unspecified, uncomplicated: Secondary | ICD-10-CM | POA: Insufficient documentation

## 2014-02-04 DIAGNOSIS — Z79899 Other long term (current) drug therapy: Secondary | ICD-10-CM | POA: Insufficient documentation

## 2014-02-04 DIAGNOSIS — R42 Dizziness and giddiness: Secondary | ICD-10-CM | POA: Diagnosis not present

## 2014-02-04 DIAGNOSIS — F411 Generalized anxiety disorder: Secondary | ICD-10-CM | POA: Insufficient documentation

## 2014-02-04 DIAGNOSIS — J449 Chronic obstructive pulmonary disease, unspecified: Secondary | ICD-10-CM | POA: Diagnosis not present

## 2014-02-04 DIAGNOSIS — IMO0002 Reserved for concepts with insufficient information to code with codable children: Secondary | ICD-10-CM | POA: Diagnosis not present

## 2014-02-04 DIAGNOSIS — J4489 Other specified chronic obstructive pulmonary disease: Secondary | ICD-10-CM | POA: Insufficient documentation

## 2014-02-04 DIAGNOSIS — R079 Chest pain, unspecified: Secondary | ICD-10-CM | POA: Diagnosis not present

## 2014-02-04 DIAGNOSIS — E039 Hypothyroidism, unspecified: Secondary | ICD-10-CM | POA: Diagnosis not present

## 2014-02-04 DIAGNOSIS — M129 Arthropathy, unspecified: Secondary | ICD-10-CM | POA: Insufficient documentation

## 2014-02-04 LAB — CBC WITH DIFFERENTIAL/PLATELET
Basophils Absolute: 0.1 10*3/uL (ref 0.0–0.1)
Basophils Relative: 0 % (ref 0–1)
EOS ABS: 0.5 10*3/uL (ref 0.0–0.7)
Eosinophils Relative: 4 % (ref 0–5)
HCT: 51.5 % (ref 39.0–52.0)
HEMOGLOBIN: 17.7 g/dL — AB (ref 13.0–17.0)
LYMPHS ABS: 4.5 10*3/uL — AB (ref 0.7–4.0)
Lymphocytes Relative: 38 % (ref 12–46)
MCH: 30.8 pg (ref 26.0–34.0)
MCHC: 34.4 g/dL (ref 30.0–36.0)
MCV: 89.6 fL (ref 78.0–100.0)
MONOS PCT: 9 % (ref 3–12)
Monocytes Absolute: 1.1 10*3/uL — ABNORMAL HIGH (ref 0.1–1.0)
NEUTROS ABS: 5.9 10*3/uL (ref 1.7–7.7)
NEUTROS PCT: 49 % (ref 43–77)
PLATELETS: 222 10*3/uL (ref 150–400)
RBC: 5.75 MIL/uL (ref 4.22–5.81)
RDW: 14.3 % (ref 11.5–15.5)
WBC: 12.1 10*3/uL — AB (ref 4.0–10.5)

## 2014-02-04 LAB — BASIC METABOLIC PANEL
Anion gap: 14 (ref 5–15)
BUN: 16 mg/dL (ref 6–23)
CHLORIDE: 106 meq/L (ref 96–112)
CO2: 21 mEq/L (ref 19–32)
Calcium: 9.4 mg/dL (ref 8.4–10.5)
Creatinine, Ser: 0.94 mg/dL (ref 0.50–1.35)
GLUCOSE: 71 mg/dL (ref 70–99)
POTASSIUM: 4.4 meq/L (ref 3.7–5.3)
SODIUM: 141 meq/L (ref 137–147)

## 2014-02-04 MED ORDER — DIAZEPAM 5 MG PO TABS
5.0000 mg | ORAL_TABLET | Freq: Once | ORAL | Status: AC
  Administered 2014-02-04: 5 mg via ORAL
  Filled 2014-02-04: qty 1
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
## 2014-02-04 NOTE — Discharge Instructions (Signed)

## 2014-03-05 ENCOUNTER — Telehealth: Payer: Self-pay | Admitting: Neurology

## 2014-03-05 NOTE — Telephone Encounter (Signed)
Pt cancelled upcoming appt w/ Dr. Delice Lesch. Referred as a new patient by the ER. The pt did not wish to r/s / [name] S.

## 2014-03-08 ENCOUNTER — Other Ambulatory Visit: Payer: Self-pay | Admitting: Gastroenterology

## 2014-03-08 DIAGNOSIS — R1084 Generalized abdominal pain: Secondary | ICD-10-CM

## 2014-03-11 ENCOUNTER — Ambulatory Visit
Admission: RE | Admit: 2014-03-11 | Discharge: 2014-03-11 | Disposition: A | Discharge status: Home / Self Care | Payer: Medicaid Other | Source: Ambulatory Visit | Attending: Gastroenterology | Admitting: Gastroenterology

## 2014-03-11 DIAGNOSIS — R1084 Generalized abdominal pain: Secondary | ICD-10-CM

## 2014-03-11 MED ORDER — IOHEXOL 300 MG/ML  SOLN: 100.0000 mL | Freq: Once | INTRAMUSCULAR | Status: AC | PRN

## 2014-03-12 ENCOUNTER — Ambulatory Visit: Payer: Medicaid Other | Admitting: Neurology

## 2014-03-15 ENCOUNTER — Other Ambulatory Visit: Payer: Medicaid Other

## 2014-05-01 ENCOUNTER — Encounter: Payer: Self-pay | Admitting: Gastroenterology

## 2014-05-14 ENCOUNTER — Ambulatory Visit: Payer: Medicaid Other | Admitting: Internal Medicine

## 2014-06-18 ENCOUNTER — Other Ambulatory Visit: Payer: Self-pay | Admitting: Gastroenterology

## 2014-08-08 ENCOUNTER — Emergency Department (HOSPITAL_COMMUNITY)
Admission: EM | Admit: 2014-08-08 | Discharge: 2014-08-08 | Disposition: A | Discharge status: Home / Self Care | Payer: Medicaid Other | Attending: Emergency Medicine | Admitting: Emergency Medicine

## 2014-08-08 DIAGNOSIS — E039 Hypothyroidism, unspecified: Secondary | ICD-10-CM | POA: Insufficient documentation

## 2014-08-08 DIAGNOSIS — Z720 Tobacco use: Secondary | ICD-10-CM | POA: Insufficient documentation

## 2014-08-08 DIAGNOSIS — K219 Gastro-esophageal reflux disease without esophagitis: Secondary | ICD-10-CM | POA: Insufficient documentation

## 2014-08-08 DIAGNOSIS — F319 Bipolar disorder, unspecified: Secondary | ICD-10-CM | POA: Insufficient documentation

## 2014-08-08 DIAGNOSIS — J449 Chronic obstructive pulmonary disease, unspecified: Secondary | ICD-10-CM | POA: Diagnosis not present

## 2014-08-08 DIAGNOSIS — F419 Anxiety disorder, unspecified: Secondary | ICD-10-CM | POA: Diagnosis not present

## 2014-08-08 DIAGNOSIS — Z79899 Other long term (current) drug therapy: Secondary | ICD-10-CM | POA: Diagnosis not present

## 2014-08-08 DIAGNOSIS — G4700 Insomnia, unspecified: Secondary | ICD-10-CM | POA: Insufficient documentation

## 2014-08-08 DIAGNOSIS — N529 Male erectile dysfunction, unspecified: Secondary | ICD-10-CM | POA: Diagnosis not present

## 2014-08-08 DIAGNOSIS — Z760 Encounter for issue of repeat prescription: Secondary | ICD-10-CM | POA: Diagnosis not present

## 2014-08-08 DIAGNOSIS — Z8739 Personal history of other diseases of the musculoskeletal system and connective tissue: Secondary | ICD-10-CM | POA: Diagnosis not present
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
## 2014-08-08 NOTE — ED Provider Notes (Signed)
CSN: [id number]     Arrival date & time 08/08/14  1216 History  This chart was scribed for non-physician practitioner, Montine Circle, working with Virgel Manifold, MD by Molli Posey, ED Scribe. This patient was seen in room WTR5/WTR5 and the patient's care was started at 12:30 PM.   Chief Complaint  Patient presents with  . Medication Refill    pt ran out of his medication, four days early   HPI HPI Comments: [name] [name] is a 59 y.o. male with a history of anxiety, asthma, COPD and GERD who presents to the Emergency Department for a medication refill for his klonopin. Pt states he has recently been taking 1 klonopin daily until 3 days ago when he ran out. Pt states he has been trying to decrease his klonopin medication but states he is now going through withdrawals. He states that his typical dosage is 1mg  klonopin b.i.d. Pt reports he gets his medication refill from his PCP on 08/12/14 and he presented to ED to receive medication until that time. Patient states that he tried calling his PCP for a refill, but was told that no refill could be given until 08/12/14.   Past Medical History  Diagnosis Date  . SOB (shortness of breath)   . Dizziness   . Anxiety   . Arthritis   . Asthma   . COPD (chronic obstructive pulmonary disease)   . Hypothyroidism   . Bipolar 1 disorder   . Chronic insomnia   . Depression   . Diaphragmatic hernia   . ED (erectile dysfunction)   . GERD (gastroesophageal reflux disease)   . HLD (hyperlipidemia)   . Umbilical hernia without obstruction and without gangrene   . Tobacco use    Past Surgical History  Procedure Laterality Date  . Colonoscopy    . Upper gi endoscopy    . Umbilical hernia repair N/A 01/01/2013    Procedure: HERNIA REPAIR UMBILICAL ADULT;  Surgeon: Merrie Roof, MD;  Location: [place];  Service: General;  Laterality: N/A;  . Insertion of mesh N/A 01/01/2013    Procedure: INSERTION OF MESH;  Surgeon: Merrie Roof, MD;  Location: [place];   Service: General;  Laterality: N/A;   Family History  Problem Relation Age of Onset  . Arrhythmia    . ALS    . Prostate cancer Maternal Grandfather   . Colon cancer Paternal Grandfather   . Heart disease Mother    History  Substance Use Topics  . Smoking status: Current Every Day Smoker -- 1.00 packs/day for 40 years    Types: Cigarettes  . Smokeless tobacco: Never Used  . Alcohol Use: Yes     Comment: social    Review of Systems  Psychiatric/Behavioral: Positive for agitation. The patient is nervous/anxious.       Allergies  Codeine; Lipitor; Lithium carbonate; Pollen extract; and Talwin  Home Medications   Prior to Admission medications   Medication Sig Start Date End Date Taking? Authorizing Provider  albuterol (PROVENTIL HFA;VENTOLIN HFA) 108 (90 BASE) MCG/ACT inhaler Inhale 1-2 puffs into the lungs 4 (four) times daily as needed for wheezing or shortness of breath (wheezing).     Historical Provider, MD  ALPRAZolam Duanne Moron) 1 MG tablet Take 1 mg by mouth 3 (three) times daily as needed for anxiety (anxiety).     Historical Provider, MD  budesonide-formoterol (SYMBICORT) 160-4.5 MCG/ACT inhaler Inhale 1-2 puffs into the lungs every 12 (twelve) hours.     Historical  Provider, MD  escitalopram (LEXAPRO) 5 MG tablet Take 5 mg by mouth daily.    Historical Provider, MD  levothyroxine (SYNTHROID, LEVOTHROID) 150 MCG tablet Take 150 mcg by mouth every morning.     Historical Provider, MD  pantoprazole (PROTONIX) 40 MG tablet Take 40 mg by mouth daily.    Historical Provider, MD  risperiDONE (RISPERDAL) 2 MG tablet Take 2 mg by mouth at bedtime.     Historical Provider, MD  sildenafil (REVATIO) 20 MG tablet Take 20-40 mg by mouth. One hour before sex as needed foe ED    Historical Provider, MD  traZODone (DESYREL) 100 MG tablet Take 200-300 mg by mouth at bedtime as needed for sleep.     Historical Provider, MD   There were no vitals taken for this visit. Physical Exam   Constitutional: He is oriented to person, place, and time. He appears well-developed and well-nourished.  HENT:  Head: Normocephalic and atraumatic.  Eyes: Right eye exhibits no discharge. Left eye exhibits no discharge.  Neck: Neck supple. No tracheal deviation present.  Pulmonary/Chest: Effort normal. No respiratory distress.  Abdominal: He exhibits no distension.  Neurological: He is alert and oriented to person, place, and time.  Skin: Skin is warm and dry.  Psychiatric: He has a normal mood and affect. His behavior is normal.  Nursing note and vitals reviewed.   ED Course  Procedures   DIAGNOSTIC STUDIES:    COORDINATION OF CARE: 12:26 PM Discussed treatment plan with pt at bedside and pt agreed to plan.   Labs Review Labs Reviewed - No data to display  Imaging Review No results found.   EKG Interpretation None      MDM   Final diagnoses:  Medication refill    Patient requesting medication refill for Klonopin. Informed patient that I cannot refill this, but offered treatment for benzodiazepine withdrawal, however patient adamantly refused, stating "if I can't get my medication here and vitals will just leave." He then walked out of the room and left the emergency department without completing the evaluation.    I personally performed the services described in this documentation, which was scribed in my presence. The recorded information has been reviewed and is accurate.      Montine Circle, PA-C 08/08/14 [place], MD 08/09/14 [phone]

## 2014-08-08 NOTE — ED Notes (Signed)
Pt is refused further treatment by PA. Refused to allow this RN to take  vital signs

## 2015-02-03 ENCOUNTER — Emergency Department (HOSPITAL_COMMUNITY)
Admission: EM | Admit: 2015-02-03 | Discharge: 2015-02-04 | Disposition: A | Discharge status: Home / Self Care | Payer: Medicaid Other | Attending: Emergency Medicine | Admitting: Emergency Medicine

## 2015-02-03 ENCOUNTER — Encounter (HOSPITAL_COMMUNITY): Payer: Self-pay

## 2015-02-03 DIAGNOSIS — N529 Male erectile dysfunction, unspecified: Secondary | ICD-10-CM | POA: Diagnosis not present

## 2015-02-03 DIAGNOSIS — F319 Bipolar disorder, unspecified: Secondary | ICD-10-CM | POA: Insufficient documentation

## 2015-02-03 DIAGNOSIS — F419 Anxiety disorder, unspecified: Secondary | ICD-10-CM | POA: Insufficient documentation

## 2015-02-03 DIAGNOSIS — J449 Chronic obstructive pulmonary disease, unspecified: Secondary | ICD-10-CM

## 2015-02-03 DIAGNOSIS — R0602 Shortness of breath: Secondary | ICD-10-CM | POA: Diagnosis present

## 2015-02-03 DIAGNOSIS — E039 Hypothyroidism, unspecified: Secondary | ICD-10-CM | POA: Insufficient documentation

## 2015-02-03 DIAGNOSIS — Z7951 Long term (current) use of inhaled steroids: Secondary | ICD-10-CM | POA: Diagnosis not present

## 2015-02-03 DIAGNOSIS — M1990 Unspecified osteoarthritis, unspecified site: Secondary | ICD-10-CM | POA: Diagnosis not present

## 2015-02-03 DIAGNOSIS — Z79899 Other long term (current) drug therapy: Secondary | ICD-10-CM | POA: Insufficient documentation

## 2015-02-03 DIAGNOSIS — G4700 Insomnia, unspecified: Secondary | ICD-10-CM | POA: Insufficient documentation

## 2015-02-03 DIAGNOSIS — K219 Gastro-esophageal reflux disease without esophagitis: Secondary | ICD-10-CM | POA: Diagnosis not present

## 2015-02-03 DIAGNOSIS — J441 Chronic obstructive pulmonary disease with (acute) exacerbation: Secondary | ICD-10-CM | POA: Diagnosis not present

## 2015-02-03 DIAGNOSIS — E785 Hyperlipidemia, unspecified: Secondary | ICD-10-CM | POA: Diagnosis not present

## 2015-02-03 DIAGNOSIS — Z720 Tobacco use: Secondary | ICD-10-CM | POA: Insufficient documentation

## 2015-02-03 NOTE — ED Notes (Signed)
Pt complains of being short of breath and having COPD, recently treated for URI. Tonight he became SOB and had a coughing episode and he was scared to go to bed

## 2015-02-04 ENCOUNTER — Emergency Department (HOSPITAL_COMMUNITY): Payer: Medicaid Other

## 2015-02-04 MED ORDER — IPRATROPIUM-ALBUTEROL 0.5-2.5 (3) MG/3ML IN SOLN
3.0000 mL | RESPIRATORY_TRACT | Status: DC
  Administered 2015-02-04: 3 mL via RESPIRATORY_TRACT
  Filled 2015-02-04: qty 3

## 2015-02-04 MED ORDER — AZITHROMYCIN 250 MG PO TABS: 250.0000 mg | ORAL_TABLET | Freq: Every day | ORAL | Status: DC

## 2015-02-04 MED ORDER — PREDNISONE 20 MG PO TABS
60.0000 mg | ORAL_TABLET | Freq: Once | ORAL | Status: AC
  Administered 2015-02-04: 60 mg via ORAL
  Filled 2015-02-04: qty 3

## 2015-02-04 MED ORDER — AZITHROMYCIN 250 MG PO TABS
500.0000 mg | ORAL_TABLET | Freq: Once | ORAL | Status: AC
  Administered 2015-02-04: 500 mg via ORAL
  Filled 2015-02-04: qty 2

## 2015-02-04 MED ORDER — PREDNISONE 20 MG PO TABS: 60.0000 mg | ORAL_TABLET | Freq: Every day | ORAL | Status: DC

## 2015-02-04 NOTE — ED Provider Notes (Signed)
CSN: [id number]     Arrival date & time 02/03/15  2350 History  By signing my name below, I, Evelene Croon, attest that this documentation has been prepared under the direction and in the presence of Everlene Balls, MD . Electronically Signed: Evelene Croon, Scribe. 02/04/2015. 1:36 AM.    Chief Complaint  Patient presents with  . Shortness of Breath    The history is provided by the patient. No language interpreter was used.    ]HPI Comments:  [name] [name] is a 59 y.o. male with a history of COPD and asthma, who presents to the Emergency Department complaining of increased SOB today. He reports associated productive cough with yellow phlegm for 3-4 days.  He has taken mucinex and used his inhaler without relief. He reports a history of the same about once a year and states he is usually placed on antibiotics. Pt denies use of home O2.   Past Medical History  Diagnosis Date  . SOB (shortness of breath)   . Dizziness   . Anxiety   . Arthritis   . Asthma   . COPD (chronic obstructive pulmonary disease)   . Hypothyroidism   . Bipolar 1 disorder   . Chronic insomnia   . Depression   . Diaphragmatic hernia   . ED (erectile dysfunction)   . GERD (gastroesophageal reflux disease)   . HLD (hyperlipidemia)   . Umbilical hernia without obstruction and without gangrene   . Tobacco use    Past Surgical History  Procedure Laterality Date  . Colonoscopy    . Upper gi endoscopy    . Umbilical hernia repair N/A 01/01/2013    Procedure: HERNIA REPAIR UMBILICAL ADULT;  Surgeon: Merrie Roof, MD;  Location: [place];  Service: General;  Laterality: N/A;  . Insertion of mesh N/A 01/01/2013    Procedure: INSERTION OF MESH;  Surgeon: Merrie Roof, MD;  Location: [place];  Service: General;  Laterality: N/A;   Family History  Problem Relation Age of Onset  . Arrhythmia    . ALS    . Prostate cancer Maternal Grandfather   . Colon cancer Paternal Grandfather   . Heart disease Mother    Social  History  Substance Use Topics  . Smoking status: Current Every Day Smoker -- 1.00 packs/day for 40 years    Types: Cigarettes  . Smokeless tobacco: Never Used  . Alcohol Use: Yes     Comment: social    Review of Systems  A complete 10 system review of systems was obtained and all systems are negative except as noted in the HPI and PMH.    Allergies  Codeine; Lipitor; Lithium carbonate; Pollen extract; and Talwin  Home Medications   Prior to Admission medications   Medication Sig Start Date End Date Taking? Authorizing Provider  albuterol (PROVENTIL HFA;VENTOLIN HFA) 108 (90 BASE) MCG/ACT inhaler Inhale 1-2 puffs into the lungs 4 (four) times daily as needed for wheezing or shortness of breath (wheezing).     Historical Provider, MD  ALPRAZolam Duanne Moron) 1 MG tablet Take 1 mg by mouth 3 (three) times daily as needed for anxiety (anxiety).     Historical Provider, MD  budesonide-formoterol (SYMBICORT) 160-4.5 MCG/ACT inhaler Inhale 1-2 puffs into the lungs every 12 (twelve) hours.     Historical Provider, MD  escitalopram (LEXAPRO) 5 MG tablet Take 5 mg by mouth daily.    Historical Provider, MD  levothyroxine (SYNTHROID, LEVOTHROID) 150 MCG tablet Take 150 mcg by  mouth every morning.     Historical Provider, MD  pantoprazole (PROTONIX) 40 MG tablet Take 40 mg by mouth daily.    Historical Provider, MD  risperiDONE (RISPERDAL) 2 MG tablet Take 2 mg by mouth at bedtime.     Historical Provider, MD  sildenafil (REVATIO) 20 MG tablet Take 20-40 mg by mouth. One hour before sex as needed foe ED    Historical Provider, MD  traZODone (DESYREL) 100 MG tablet Take 200-300 mg by mouth at bedtime as needed for sleep.     Historical Provider, MD   BP 119/76 mmHg  Pulse 60  Temp(Src) 97.5 F (36.4 C) (Oral)  Resp 13  Ht 5\' 7"  (1.702 m)  Wt 178 lb (80.74 kg)  BMI 27.87 kg/m2  SpO2 97% Physical Exam  Constitutional: He is oriented to person, place, and time. Vital signs are normal. He appears  well-developed and well-nourished.  Non-toxic appearance. He does not appear ill. No distress.  HENT:  Head: Normocephalic and atraumatic.  Nose: Nose normal.  Mouth/Throat: Oropharynx is clear and moist. No oropharyngeal exudate.  Eyes: Conjunctivae and EOM are normal. Pupils are equal, round, and reactive to light. No scleral icterus.  Neck: Normal range of motion. Neck supple. No tracheal deviation, no edema, no erythema and normal range of motion present. No thyroid mass and no thyromegaly present.  Cardiovascular: Normal rate, regular rhythm, S1 normal, S2 normal, normal heart sounds, intact distal pulses and normal pulses.  Exam reveals no gallop and no friction rub.   No murmur heard. Pulses:      Radial pulses are 2+ on the right side, and 2+ on the left side.       Dorsalis pedis pulses are 2+ on the right side, and 2+ on the left side.  Pulmonary/Chest: Effort normal and breath sounds normal. No respiratory distress. He has no wheezes. He has no rhonchi. He has no rales.  Cough noted  Abdominal: Soft. Normal appearance and bowel sounds are normal. He exhibits no distension, no ascites and no mass. There is no hepatosplenomegaly. There is no tenderness. There is no rebound, no guarding and no CVA tenderness.  Musculoskeletal: Normal range of motion. He exhibits no edema or tenderness.  Lymphadenopathy:    He has no cervical adenopathy.  Neurological: He is alert and oriented to person, place, and time. He has normal strength. No cranial nerve deficit or sensory deficit.  Skin: Skin is warm, dry and intact. No petechiae and no rash noted. He is not diaphoretic. No erythema. No pallor.  Psychiatric: He has a normal mood and affect. His behavior is normal. Judgment normal.  Nursing note and vitals reviewed.   ED Course  Procedures   DIAGNOSTIC STUDIES:  Oxygen Saturation is 97% on RA, normal by my interpretation.    COORDINATION OF CARE:  1:24 AM Discussed treatment plan with  pt at bedside and pt agreed to plan.  Labs Review Labs Reviewed - No data to display  Imaging Review Dg Chest 2 View  02/04/2015   CLINICAL DATA:  Cough, shortness of breath for 1 week. History of COPD.  EXAM: CHEST  2 VIEW  COMPARISON:  Chest radiograph December 10, 2014  FINDINGS: Cardiomediastinal silhouette is normal. The lungs are clear without pleural effusions or focal consolidations. Increased lung volumes with flattened hemidiaphragms can be seen with COPD. Stable apical pleural thickening. Stable LEFT midlung zone scarring. Trachea projects midline and there is no pneumothorax. Soft tissue planes and included osseous structures  are non-suspicious. Old LEFT lateral rib fractures.  IMPRESSION: No acute cardiopulmonary process.   Electronically Signed   By: Elon Alas M.D.   On: 02/04/2015 00:58   I have personally reviewed and evaluated these images and lab results as part of my medical decision-making.   EKG Interpretation   Date/Time:  Tuesday February 04 2015 00:00:56 EDT Ventricular Rate:  71 PR Interval:  154 QRS Duration: 91 QT Interval:  410 QTC Calculation: 446 R Axis:   -7 Text Interpretation:  Sinus rhythm No significant change since last  tracing Confirmed by Glynn Octave [phone]) on 02/04/2015 12:24:28  AM      MDM   Final diagnoses:  None    Patient presents emergency department for coughing and shortness of breath over the last 3-4 days. He states she's had fevers and chills that are subjective. No fever documented here. Chest x-ray is negative, lung exam is completely normal. He states he normally gets about once a year when this occurs. I tried to explain to patient that he does not need antibiotics however he insists, he was given prednisone and azithromycin emergency department, will discharge home with a prescription. He appears well in no acute distress, his vital signs remain within his normal limits and he is safe for discharge. She was taken  off oxygen and his oxygen saturation remained greater than 95%.  I personally performed the services described in this documentation, which was scribed in my presence. The recorded information has been reviewed and is accurate.   Everlene Balls, MD 02/04/15 [phone]

## 2015-02-04 NOTE — Discharge Instructions (Signed)
Chronic Obstructive Pulmonary Disease Exacerbation [name] [name], take antibiotics as directed and see your primary care doctor at your appointment tomorrow at 11am.  Your chest xray does not show any infections and your lungs are clear today.  If any symptoms worsen, come back to the ED immediately. Thank you.  Chronic obstructive pulmonary disease (COPD) is a common lung problem. In COPD, the flow of air from the lungs is limited. COPD exacerbations are times that breathing gets worse and you need extra treatment. Without treatment they can be life threatening. If they happen often, your lungs can become more damaged. HOME CARE  Do not smoke.  Avoid tobacco smoke and other things that bother your lungs.  If given, take your antibiotic medicine as told. Finish the medicine even if you start to feel better.  Only take medicines as told by your doctor.  Drink enough fluids to keep your pee (urine) clear or pale yellow (unless your doctor has told you not to).  Use a cool mist machine (vaporizer).  If you use oxygen or a machine that turns liquid medicine into a mist (nebulizer), continue to use them as told.  Keep up with shots (vaccinations) as told by your doctor.  Exercise regularly.  Eat healthy foods.  Keep all doctor visits as told. GET HELP RIGHT AWAY IF:  You are very short of breath and it gets worse.  You have trouble talking.  You have bad chest pain.  You have blood in your spit (sputum).  You have a fever.  You keep throwing up (vomiting).  You feel weak, or you pass out (faint).  You feel confused.  You keep getting worse. MAKE SURE YOU:   Understand these instructions.  Will watch your condition.  Will get help right away if you are not doing well or get worse. Document Released: 04/15/2011 Document Revised: 02/14/2013 Document Reviewed: 12/29/2012 [facility] Patient Information 2015 [place], [place]. This information is not intended to replace advice  given to you by your health care provider. Make sure you discuss any questions you have with your health care provider.

## 2015-02-04 NOTE — ED Notes (Signed)
Pt states he has been dizzy and lightheaded for a few day.

## 2015-02-04 NOTE — ED Notes (Signed)
Family at bedside. mother 

## 2015-02-12 IMAGING — CR DG CHEST 2V
2 series · 2 of 2 positions shown · non-contrast
Comparison: 12/29/2012

CLINICAL DATA: Chest pain x3 weeks, COPD

EXAM:
CHEST  2 VIEW

[w chest pa]
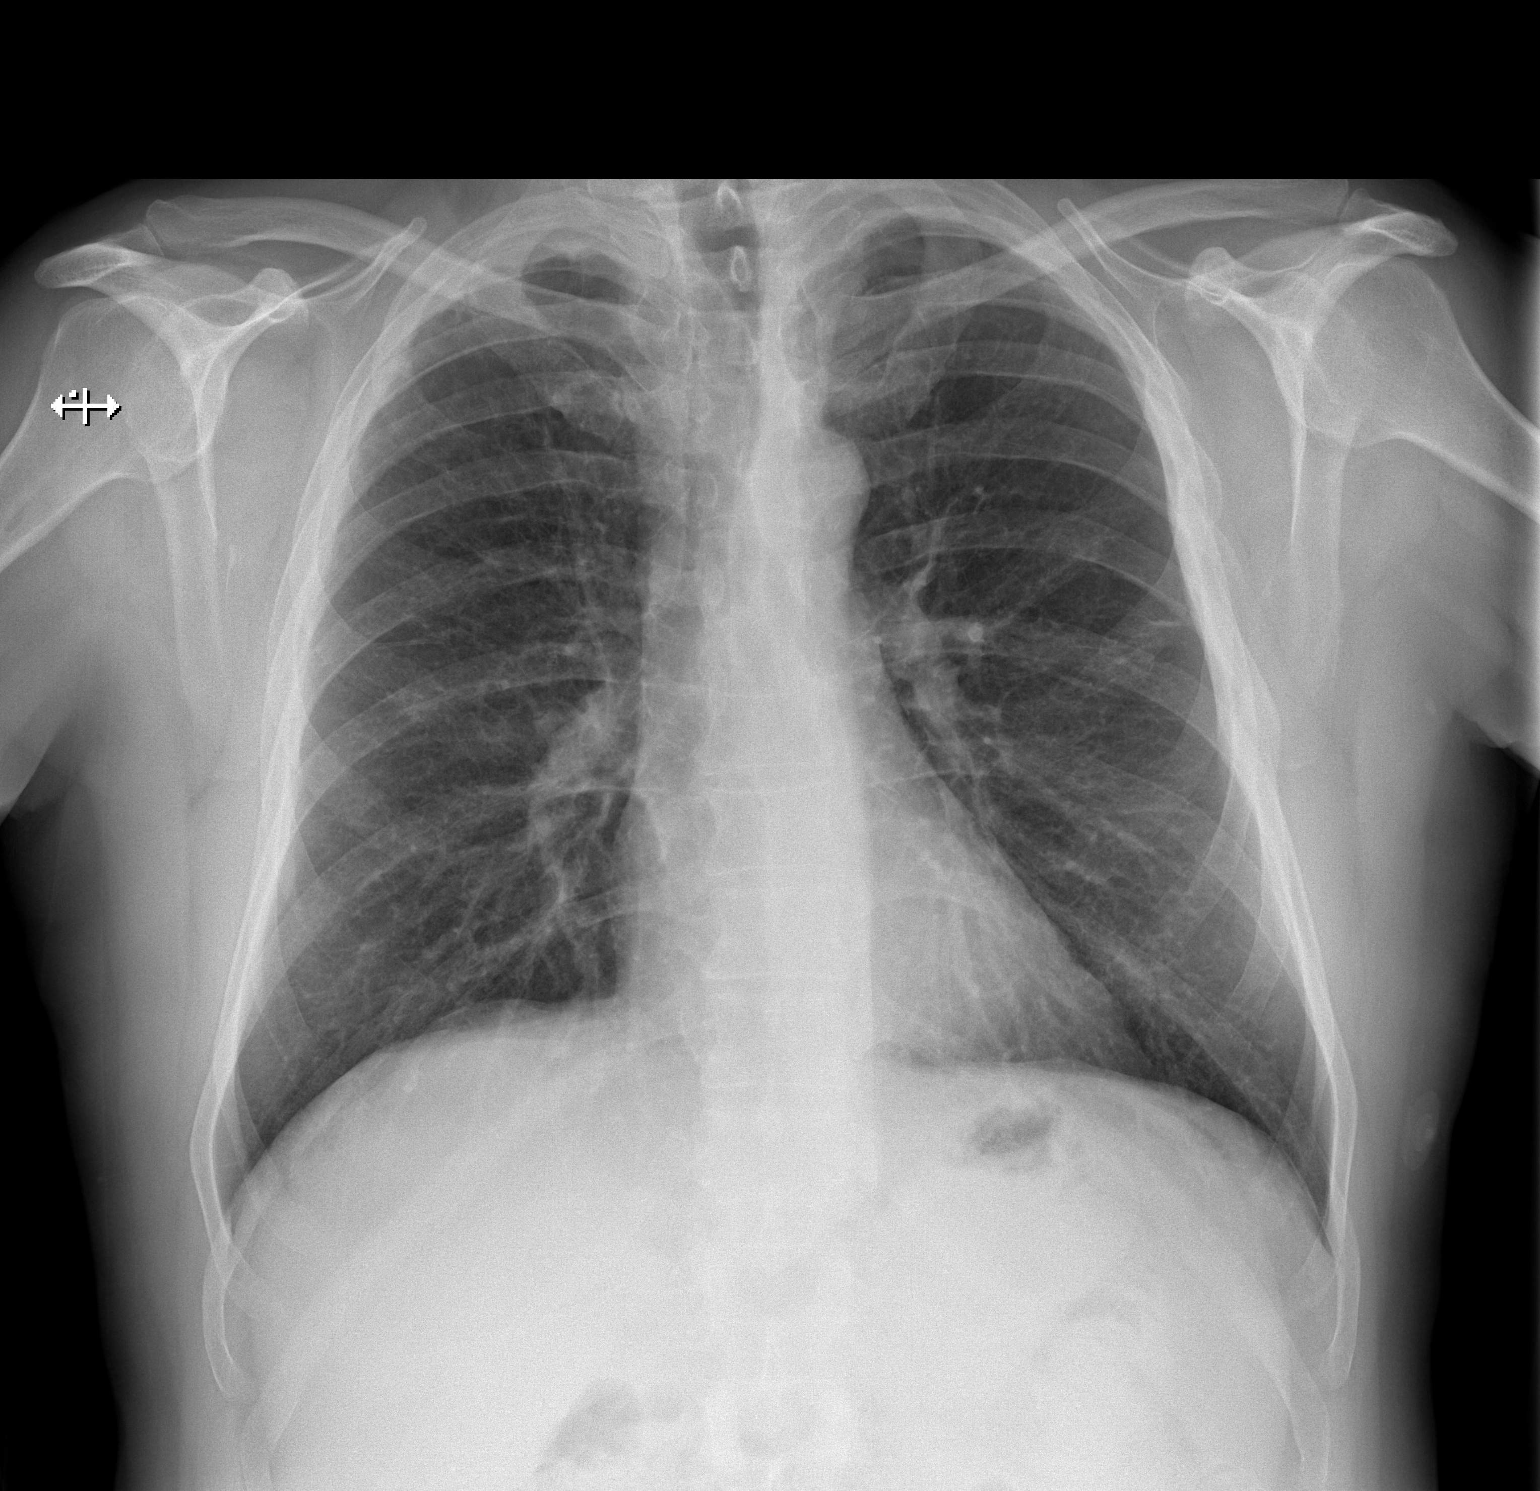

[w chest lat]
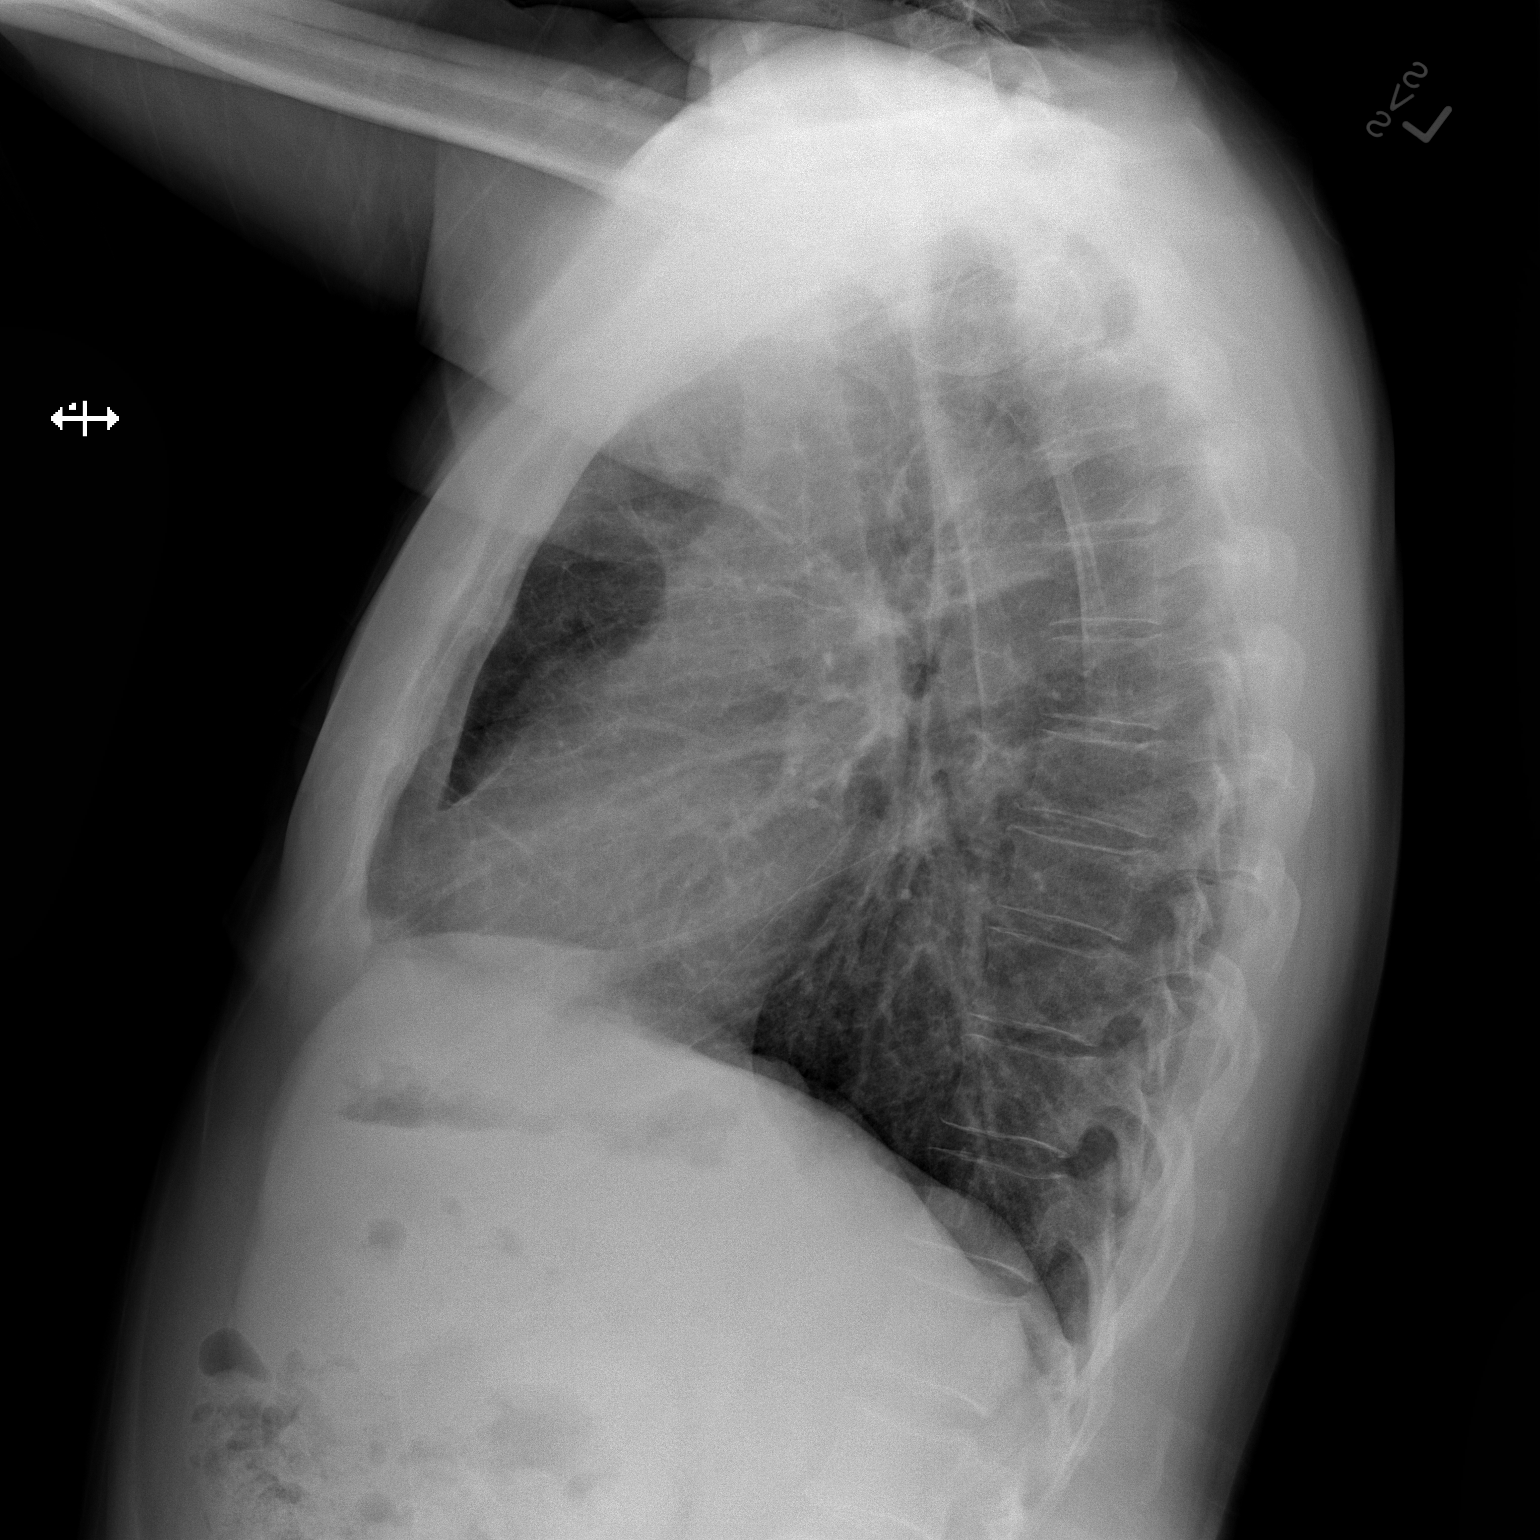

[2 of 2 positions shown; findings below may reference images not displayed]

FINDINGS: Chronic interstitial markings/emphysematous changes. No focal
consolidation. No pleural effusion or pneumothorax.

The heart is normal in size.

Mild degenerative changes of the visualized thoracolumbar spine.
Multiple old left rib fracture deformities.
IMPRESSION: No evidence of acute cardiopulmonary disease.

## 2015-02-19 ENCOUNTER — Encounter (HOSPITAL_COMMUNITY): Payer: Self-pay | Admitting: *Deleted

## 2015-02-19 ENCOUNTER — Emergency Department (HOSPITAL_COMMUNITY)
Admission: EM | Admit: 2015-02-19 | Discharge: 2015-02-19 | Disposition: A | Discharge status: Home / Self Care | Payer: Medicaid Other | Attending: Emergency Medicine | Admitting: Emergency Medicine

## 2015-02-19 ENCOUNTER — Emergency Department (HOSPITAL_COMMUNITY): Payer: Medicaid Other

## 2015-02-19 DIAGNOSIS — S79911A Unspecified injury of right hip, initial encounter: Secondary | ICD-10-CM | POA: Insufficient documentation

## 2015-02-19 DIAGNOSIS — F319 Bipolar disorder, unspecified: Secondary | ICD-10-CM | POA: Diagnosis not present

## 2015-02-19 DIAGNOSIS — Y9289 Other specified places as the place of occurrence of the external cause: Secondary | ICD-10-CM | POA: Insufficient documentation

## 2015-02-19 DIAGNOSIS — Z720 Tobacco use: Secondary | ICD-10-CM | POA: Diagnosis not present

## 2015-02-19 DIAGNOSIS — N529 Male erectile dysfunction, unspecified: Secondary | ICD-10-CM | POA: Insufficient documentation

## 2015-02-19 DIAGNOSIS — Z8669 Personal history of other diseases of the nervous system and sense organs: Secondary | ICD-10-CM | POA: Diagnosis not present

## 2015-02-19 DIAGNOSIS — M1990 Unspecified osteoarthritis, unspecified site: Secondary | ICD-10-CM | POA: Diagnosis not present

## 2015-02-19 DIAGNOSIS — Y9389 Activity, other specified: Secondary | ICD-10-CM | POA: Insufficient documentation

## 2015-02-19 DIAGNOSIS — K219 Gastro-esophageal reflux disease without esophagitis: Secondary | ICD-10-CM | POA: Diagnosis not present

## 2015-02-19 DIAGNOSIS — M25559 Pain in unspecified hip: Secondary | ICD-10-CM

## 2015-02-19 DIAGNOSIS — Z7951 Long term (current) use of inhaled steroids: Secondary | ICD-10-CM | POA: Diagnosis not present

## 2015-02-19 DIAGNOSIS — F419 Anxiety disorder, unspecified: Secondary | ICD-10-CM | POA: Insufficient documentation

## 2015-02-19 DIAGNOSIS — S79912A Unspecified injury of left hip, initial encounter: Secondary | ICD-10-CM | POA: Diagnosis not present

## 2015-02-19 DIAGNOSIS — W1839XA Other fall on same level, initial encounter: Secondary | ICD-10-CM | POA: Diagnosis not present

## 2015-02-19 DIAGNOSIS — J449 Chronic obstructive pulmonary disease, unspecified: Secondary | ICD-10-CM | POA: Diagnosis not present

## 2015-02-19 DIAGNOSIS — Y998 Other external cause status: Secondary | ICD-10-CM | POA: Diagnosis not present

## 2015-02-19 DIAGNOSIS — E039 Hypothyroidism, unspecified: Secondary | ICD-10-CM | POA: Insufficient documentation

## 2015-02-19 DIAGNOSIS — W19XXXA Unspecified fall, initial encounter: Secondary | ICD-10-CM

## 2015-02-19 DIAGNOSIS — Z79899 Other long term (current) drug therapy: Secondary | ICD-10-CM | POA: Insufficient documentation

## 2015-02-19 MED ORDER — TRAMADOL HCL 50 MG PO TABS: 50.0000 mg | ORAL_TABLET | Freq: Once | ORAL | Status: DC

## 2015-02-19 MED ORDER — TRAMADOL HCL 50 MG PO TABS: 50.0000 mg | ORAL_TABLET | Freq: Four times a day (QID) | ORAL | Status: DC | PRN

## 2015-02-19 NOTE — Discharge Instructions (Signed)

## 2015-02-19 NOTE — ED Provider Notes (Signed)
CSN: [id number]     Arrival date & time 02/19/15  2297 History   First MD Initiated Contact with Patient 02/19/15 0459     Chief Complaint  Patient presents with  . Fall  . Hip Pain     (Consider location/radiation/quality/duration/timing/severity/associated sxs/prior Treatment) Patient is a 59 y.o. male presenting with fall and hip pain. The history is provided by the patient. No language interpreter was used.  Fall This is a new problem. The current episode started yesterday. Pertinent negatives include no abdominal pain, chest pain, chills, fever, nausea, numbness, vomiting or weakness. Associated symptoms comments: Presents with report of fall while on a ladder yesterday afternoon, landing on concrete. He believes he was at a height of about 12 feet. He denies hitting his head, neck pain, chest or abdominal pain. He complains of pain in bilateral hips that has worsened over time since the fall. He has been ambulatory. No vomiting, numbness, tingling or weakness. .  Hip Pain Pertinent negatives include no abdominal pain, chest pain, chills, fever, nausea, numbness, vomiting or weakness.    Past Medical History  Diagnosis Date  . SOB (shortness of breath)   . Dizziness   . Anxiety   . Arthritis   . Asthma   . COPD (chronic obstructive pulmonary disease) ([place])   . Hypothyroidism   . Bipolar 1 disorder ([place])   . Chronic insomnia   . Depression   . Diaphragmatic hernia   . ED (erectile dysfunction)   . GERD (gastroesophageal reflux disease)   . HLD (hyperlipidemia)   . Umbilical hernia without obstruction and without gangrene   . Tobacco use    Past Surgical History  Procedure Laterality Date  . Colonoscopy    . Upper gi endoscopy    . Umbilical hernia repair N/A 01/01/2013    Procedure: HERNIA REPAIR UMBILICAL ADULT;  Surgeon: Merrie Roof, MD;  Location: [place];  Service: General;  Laterality: N/A;  . Insertion of mesh N/A 01/01/2013    Procedure: INSERTION OF MESH;   Surgeon: Merrie Roof, MD;  Location: [place];  Service: General;  Laterality: N/A;   Family History  Problem Relation Age of Onset  . Arrhythmia    . ALS    . Prostate cancer Maternal Grandfather   . Colon cancer Paternal Grandfather   . Heart disease Mother    Social History  Substance Use Topics  . Smoking status: Current Every Day Smoker -- 1.00 packs/day for 40 years    Types: Cigarettes  . Smokeless tobacco: Never Used  . Alcohol Use: Yes     Comment: social    Review of Systems  Constitutional: Negative for fever and chills.  Respiratory: Negative.  Negative for shortness of breath.   Cardiovascular: Negative.  Negative for chest pain.  Gastrointestinal: Negative.  Negative for nausea, vomiting and abdominal pain.  Musculoskeletal: Negative for back pain.       See HPI  Skin: Negative.  Negative for wound.  Neurological: Negative.  Negative for weakness and numbness.      Allergies  Codeine; Lipitor; Lithium carbonate; Pollen extract; and Talwin  Home Medications   Prior to Admission medications   Medication Sig Start Date End Date Taking? Authorizing Provider  albuterol (PROVENTIL HFA;VENTOLIN HFA) 108 (90 BASE) MCG/ACT inhaler Inhale 1-2 puffs into the lungs 4 (four) times daily as needed for wheezing or shortness of breath (wheezing).    Yes Historical Provider, MD  budesonide-formoterol (SYMBICORT) 160-4.5 MCG/ACT inhaler  Inhale 1-2 puffs into the lungs every 12 (twelve) hours.    Yes Historical Provider, MD  esomeprazole (NEXIUM) 40 MG capsule Take 40 mg by mouth daily at 12 noon.   Yes Historical Provider, MD  fluticasone (FLONASE) 50 MCG/ACT nasal spray Place 2 sprays into both nostrils daily. 02/03/15  Yes Historical Provider, MD  levothyroxine (SYNTHROID, LEVOTHROID) 150 MCG tablet Take 150 mcg by mouth every morning.    Yes Historical Provider, MD  risperiDONE (RISPERDAL) 2 MG tablet Take 2 mg by mouth at bedtime.    Yes Historical Provider, MD  traZODone  (DESYREL) 100 MG tablet Take 200-300 mg by mouth at bedtime as needed for sleep.    Yes Historical Provider, MD  azithromycin (ZITHROMAX) 250 MG tablet Take 1 tablet (250 mg total) by mouth daily. Patient not taking: Reported on 02/19/2015 02/04/15   Everlene Balls, MD  doxycycline (VIBRAMYCIN) 100 MG capsule Take 1 capsule by mouth every 12 (twelve) hours. For 10 days 02/04/15   Historical Provider, MD  predniSONE (DELTASONE) 20 MG tablet Take 3 tablets (60 mg total) by mouth daily. Patient not taking: Reported on 02/19/2015 02/04/15   Everlene Balls, MD  sildenafil (REVATIO) 20 MG tablet Take 20-40 mg by mouth. One hour before sex as needed foe ED    Historical Provider, MD   BP 109/73 mmHg  Pulse 95  Temp(Src) 97.7 F (36.5 C) (Oral)  Resp 16  SpO2 97% Physical Exam  Constitutional: He is oriented to person, place, and time. He appears well-developed and well-nourished.  HENT:  Head: Normocephalic and atraumatic.  Neck: Normal range of motion. Neck supple.  Cardiovascular: Normal rate and intact distal pulses.   Pulmonary/Chest: Effort normal.  Abdominal: Soft. Bowel sounds are normal. There is no tenderness. There is no rebound and no guarding.  Musculoskeletal: Normal range of motion.  No deformity of hips. He is moving lower extremities without difficulty. No midline neck tenderness.   Neurological: He is alert and oriented to person, place, and time.  Skin: Skin is warm and dry. No rash noted.  Psychiatric: He has a normal mood and affect.    ED Course  Procedures (including critical care time) Labs Review Labs Reviewed - No data to display  Imaging Review Dg Knee 2 Views Right  02/19/2015  CLINICAL DATA:  [place] yesterday at home. Now with generalized right knee pain. EXAM: RIGHT KNEE - 1-2 VIEW COMPARISON:  None. FINDINGS: Mild degenerative changes in the right knee with medial greater than lateral compartment narrowing and osteophytosis. Mild chondrocalcinosis. No significant  effusion. No evidence of acute fracture or subluxation. No focal bone lesion or bone destruction. Bone cortex and trabecular architecture appear intact. No radiopaque soft tissue foreign bodies. IMPRESSION: Degenerative changes in the right knee. No acute fractures identified. Electronically Signed   By: Lucienne Capers M.D.   On: 02/19/2015 05:34   Dg Hips Bilat With Pelvis Min 5 Views  02/19/2015  CLINICAL DATA:  [place] yesterday with bilateral hip pain. Bruising in the right hip. EXAM: DG HIP (WITH OR WITHOUT PELVIS) 5+V BILAT COMPARISON:  None. FINDINGS: Mild degenerative changes in both hips. No evidence of acute fracture or dislocation. No focal bone lesion or bone destruction. Bone cortex appears intact. SI joints and symphysis pubis are not displaced. Soft tissues are unremarkable. IMPRESSION: Degenerative changes in the hips. No acute displaced fractures identified. Electronically Signed   By: Lucienne Capers M.D.   On: 02/19/2015 05:33   I have personally reviewed  and evaluated these images and lab results as part of my medical decision-making.   EKG Interpretation None      MDM   Final diagnoses:  None    1. Hip pain 2. Fall  The patient has no bruising or swelling visualized on exam. He is moving all extremities, has no neurologic deficits, negative imaging and normal vital signs. He is felt stable for discharge home. Encouraged follow up with PCP.    Charlann Lange, PA-C 02/19/15 4210  Everlene Balls, MD 02/19/15 [phone]

## 2015-02-19 NOTE — ED Notes (Signed)
Pt states that he was attempting to hang an antenna yesterday afternoon and fell; pt c/o bilateral hip pain rt > left and rt knee pain; pt states that he had a + LOC for a few seconds but denies headache, neck or back pain at present; pt states that he has been applying ice and heat and has been unable to get relief; pt states " I need a MRI of both hips"

## 2015-02-19 NOTE — ED Notes (Signed)
Pt stated, "tell her (refering to the PA) she can shove those up her ass!" as he throws the electronic signature pen at the RN and proceeds to jump off of the bed and power walk down the hallway to leave.

## 2015-02-19 NOTE — ED Notes (Signed)
Pt cursing and saying "that shit don't work! She is going to have to come up with something better than that like oxycontin or percocets or something shit that tramadol is like taking a damn aspirin." [place], PA notified. No new orders at this time.

## 2015-03-07 ENCOUNTER — Emergency Department (HOSPITAL_COMMUNITY)
Admission: EM | Admit: 2015-03-07 | Discharge: 2015-03-07 | Disposition: A | Discharge status: Home / Self Care | Payer: Medicaid Other | Attending: Physician Assistant | Admitting: Physician Assistant

## 2015-03-07 ENCOUNTER — Encounter (HOSPITAL_COMMUNITY): Payer: Self-pay | Admitting: Nurse Practitioner

## 2015-03-07 DIAGNOSIS — F10129 Alcohol abuse with intoxication, unspecified: Secondary | ICD-10-CM | POA: Diagnosis not present

## 2015-03-07 DIAGNOSIS — Z79899 Other long term (current) drug therapy: Secondary | ICD-10-CM | POA: Insufficient documentation

## 2015-03-07 DIAGNOSIS — Y9289 Other specified places as the place of occurrence of the external cause: Secondary | ICD-10-CM | POA: Diagnosis not present

## 2015-03-07 DIAGNOSIS — T402X1A Poisoning by other opioids, accidental (unintentional), initial encounter: Secondary | ICD-10-CM | POA: Insufficient documentation

## 2015-03-07 DIAGNOSIS — IMO0002 Reserved for concepts with insufficient information to code with codable children: Secondary | ICD-10-CM

## 2015-03-07 DIAGNOSIS — G4700 Insomnia, unspecified: Secondary | ICD-10-CM | POA: Insufficient documentation

## 2015-03-07 DIAGNOSIS — Z7951 Long term (current) use of inhaled steroids: Secondary | ICD-10-CM | POA: Insufficient documentation

## 2015-03-07 DIAGNOSIS — Y9389 Activity, other specified: Secondary | ICD-10-CM | POA: Insufficient documentation

## 2015-03-07 DIAGNOSIS — T391X1A Poisoning by 4-Aminophenol derivatives, accidental (unintentional), initial encounter: Secondary | ICD-10-CM | POA: Diagnosis not present

## 2015-03-07 DIAGNOSIS — T424X1A Poisoning by benzodiazepines, accidental (unintentional), initial encounter: Secondary | ICD-10-CM | POA: Insufficient documentation

## 2015-03-07 DIAGNOSIS — J449 Chronic obstructive pulmonary disease, unspecified: Secondary | ICD-10-CM | POA: Diagnosis not present

## 2015-03-07 DIAGNOSIS — T39391A Poisoning by other nonsteroidal anti-inflammatory drugs [NSAID], accidental (unintentional), initial encounter: Secondary | ICD-10-CM | POA: Insufficient documentation

## 2015-03-07 DIAGNOSIS — Z720 Tobacco use: Secondary | ICD-10-CM | POA: Diagnosis not present

## 2015-03-07 DIAGNOSIS — E039 Hypothyroidism, unspecified: Secondary | ICD-10-CM | POA: Insufficient documentation

## 2015-03-07 DIAGNOSIS — F419 Anxiety disorder, unspecified: Secondary | ICD-10-CM | POA: Diagnosis not present

## 2015-03-07 DIAGNOSIS — K219 Gastro-esophageal reflux disease without esophagitis: Secondary | ICD-10-CM | POA: Diagnosis not present

## 2015-03-07 DIAGNOSIS — T50901A Poisoning by unspecified drugs, medicaments and biological substances, accidental (unintentional), initial encounter: Secondary | ICD-10-CM

## 2015-03-07 DIAGNOSIS — N529 Male erectile dysfunction, unspecified: Secondary | ICD-10-CM | POA: Insufficient documentation

## 2015-03-07 DIAGNOSIS — F1110 Opioid abuse, uncomplicated: Secondary | ICD-10-CM | POA: Diagnosis not present

## 2015-03-07 DIAGNOSIS — M1990 Unspecified osteoarthritis, unspecified site: Secondary | ICD-10-CM | POA: Insufficient documentation

## 2015-03-07 DIAGNOSIS — F319 Bipolar disorder, unspecified: Secondary | ICD-10-CM | POA: Insufficient documentation

## 2015-03-07 DIAGNOSIS — Y998 Other external cause status: Secondary | ICD-10-CM | POA: Insufficient documentation

## 2015-03-07 DIAGNOSIS — T43211A Poisoning by selective serotonin and norepinephrine reuptake inhibitors, accidental (unintentional), initial encounter: Secondary | ICD-10-CM | POA: Diagnosis not present

## 2015-03-07 LAB — CBC
HEMATOCRIT: 41.4 % (ref 39.0–52.0)
HEMOGLOBIN: 14.3 g/dL (ref 13.0–17.0)
MCH: 31.1 pg (ref 26.0–34.0)
MCHC: 34.5 g/dL (ref 30.0–36.0)
MCV: 90 fL (ref 78.0–100.0)
Platelets: 326 10*3/uL (ref 150–400)
RBC: 4.6 MIL/uL (ref 4.22–5.81)
RDW: 13.9 % (ref 11.5–15.5)
WBC: 10.3 10*3/uL (ref 4.0–10.5)

## 2015-03-07 LAB — COMPREHENSIVE METABOLIC PANEL
ALBUMIN: 4 g/dL (ref 3.5–5.0)
ALT: 33 U/L (ref 17–63)
ANION GAP: 10 (ref 5–15)
AST: 19 U/L (ref 15–41)
Alkaline Phosphatase: 46 U/L (ref 38–126)
BILIRUBIN TOTAL: 0.6 mg/dL (ref 0.3–1.2)
BUN: 12 mg/dL (ref 6–20)
CHLORIDE: 103 mmol/L (ref 101–111)
CO2: 22 mmol/L (ref 22–32)
Calcium: 8.9 mg/dL (ref 8.9–10.3)
Creatinine, Ser: 0.95 mg/dL (ref 0.61–1.24)
GFR calc Af Amer: >60 mL/min (ref >60)
GFR calc non Af Amer: >60 mL/min (ref >60)
GLUCOSE: 97 mg/dL (ref 65–99)
POTASSIUM: 3.7 mmol/L (ref 3.5–5.1)
SODIUM: 135 mmol/L (ref 135–145)
Total Protein: 6.8 g/dL (ref 6.5–8.1)

## 2015-03-07 LAB — ETHANOL: Alcohol, Ethyl (B): 99 mg/dL — ABNORMAL HIGH (ref <5)

## 2015-03-07 LAB — RAPID URINE DRUG SCREEN, HOSP PERFORMED
Amphetamines: NOT DETECTED
BARBITURATES: NOT DETECTED
BENZODIAZEPINES: NOT DETECTED
COCAINE: NOT DETECTED
OPIATES: POSITIVE — AB
TETRAHYDROCANNABINOL: NOT DETECTED

## 2015-03-07 LAB — ACETAMINOPHEN LEVEL

## 2015-03-07 LAB — SALICYLATE LEVEL: Salicylate Lvl: <4 mg/dL (ref 2.8–30.0)

## 2015-03-07 NOTE — ED Notes (Signed)
EKG given to EDP,[place],MD., for review.

## 2015-03-07 NOTE — ED Provider Notes (Signed)
CSN: [id number]     Arrival date & time 03/07/15  0008 History   First MD Initiated Contact with Patient 03/07/15 0045     Chief Complaint  Patient presents with  . Alcohol Intoxication  . Drug Overdose     (Consider location/radiation/quality/duration/timing/severity/associated sxs/prior Treatment) HPI Comments: 59 year old male who presents intoxicated to the emergency room tonight.  He states he accidentally took too many of his normal medications which include trazodone, meloxicam, Percocet and Klonopin after drinking heavily this evening.  He states he's been under a lot of stress caring for his elderly mother who has Alzheimer's.  He is trying to work from him and she constantly interrupts him.  He states he laid that is normal.  P.m. medications and after drinking.  He must have picked up the bottles and taken from them rather than his medicine that was laid out.  This was unintentional.  He did not suicidal, homicidal  Patient is a 59 y.o. male presenting with intoxication and Overdose. The history is provided by the patient.  Alcohol Intoxication This is a new problem. The current episode started today. The problem has been unchanged. Pertinent negatives include no chest pain, chills, fever or headaches. Nothing aggravates the symptoms. He has tried nothing for the symptoms. The treatment provided no relief.  Drug Overdose Pertinent negatives include no chest pain, chills, fever or headaches.    Past Medical History  Diagnosis Date  . SOB (shortness of breath)   . Dizziness   . Anxiety   . Arthritis   . Asthma   . COPD (chronic obstructive pulmonary disease) ([place])   . Hypothyroidism   . Bipolar 1 disorder ([place])   . Chronic insomnia   . Depression   . Diaphragmatic hernia   . ED (erectile dysfunction)   . GERD (gastroesophageal reflux disease)   . HLD (hyperlipidemia)   . Umbilical hernia without obstruction and without gangrene   . Tobacco use    Past Surgical  History  Procedure Laterality Date  . Colonoscopy    . Upper gi endoscopy    . Umbilical hernia repair N/A 01/01/2013    Procedure: HERNIA REPAIR UMBILICAL ADULT;  Surgeon: Merrie Roof, MD;  Location: [facility];  Service: General;  Laterality: N/A;  . Insertion of mesh N/A 01/01/2013    Procedure: INSERTION OF MESH;  Surgeon: Merrie Roof, MD;  Location: [facility];  Service: General;  Laterality: N/A;   Family History  Problem Relation Age of Onset  . Arrhythmia    . ALS    . Prostate cancer Maternal Grandfather   . Colon cancer Paternal Grandfather   . Heart disease Mother    Social History  Substance Use Topics  . Smoking status: Current Every Day Smoker -- 1.00 packs/day for 40 years    Types: Cigarettes  . Smokeless tobacco: Never Used  . Alcohol Use: Yes     Comment: social    Review of Systems  Constitutional: Negative for fever and chills.  Respiratory: Negative for shortness of breath.   Cardiovascular: Negative for chest pain.  Neurological: Negative for dizziness and headaches.  Psychiatric/Behavioral: Negative for suicidal ideas. The patient is not nervous/anxious.   All other systems reviewed and are negative.     Allergies  Codeine; Lipitor; Lithium carbonate; Pollen extract; and Talwin  Home Medications   Prior to Admission medications   Medication Sig Start Date End Date Taking? Authorizing Provider  albuterol (PROVENTIL HFA;VENTOLIN HFA) 108 (90  BASE) MCG/ACT inhaler Inhale 1-2 puffs into the lungs 4 (four) times daily as needed for wheezing or shortness of breath (wheezing).    Yes Historical Provider, MD  budesonide-formoterol (SYMBICORT) 160-4.5 MCG/ACT inhaler Inhale 1-2 puffs into the lungs every 12 (twelve) hours.    Yes Historical Provider, MD  clonazePAM (KLONOPIN) 1 MG tablet Take 1 tablet by mouth 3 (three) times daily. 02/24/15  Yes Historical Provider, MD  esomeprazole (NEXIUM) 40 MG capsule Take 40 mg by mouth daily at 12 noon.   Yes Historical  Provider, MD  fluticasone (FLONASE) 50 MCG/ACT nasal spray Place 2 sprays into both nostrils daily. 02/03/15  Yes Historical Provider, MD  HYDROcodone-acetaminophen (NORCO/VICODIN) 5-325 MG tablet Take 1 tablet by mouth every 6 (six) hours as needed for moderate pain.   Yes Historical Provider, MD  levothyroxine (SYNTHROID, LEVOTHROID) 150 MCG tablet Take 150 mcg by mouth every morning.    Yes Historical Provider, MD  meloxicam (MOBIC) 15 MG tablet Take 1 tablet by mouth daily. 02/21/15  Yes Historical Provider, MD  risperiDONE (RISPERDAL) 2 MG tablet Take 2 mg by mouth at bedtime.    Yes Historical Provider, MD  sildenafil (REVATIO) 20 MG tablet Take 20-40 mg by mouth. One hour before sex as needed foe ED   Yes Historical Provider, MD  traZODone (DESYREL) 100 MG tablet Take 200-300 mg by mouth at bedtime as needed for sleep.    Yes Historical Provider, MD  azithromycin (ZITHROMAX) 250 MG tablet Take 1 tablet (250 mg total) by mouth daily. Patient not taking: Reported on 02/19/2015 02/04/15   Everlene Balls, MD  predniSONE (DELTASONE) 20 MG tablet Take 3 tablets (60 mg total) by mouth daily. Patient not taking: Reported on 02/19/2015 02/04/15   Everlene Balls, MD  traMADol (ULTRAM) 50 MG tablet Take 1 tablet (50 mg total) by mouth every 6 (six) hours as needed. Patient not taking: Reported on 03/07/2015 02/19/15   Charlann Lange, PA-C   BP 127/80 mmHg  Pulse 76  Temp(Src) 97.9 F (36.6 C) (Oral)  Resp 18  SpO2 93% Physical Exam  Constitutional: He is oriented to person, place, and time. He appears well-developed and well-nourished.  HENT:  Head: Normocephalic.  Eyes: Pupils are equal, round, and reactive to light.  Neck: Normal range of motion.  Cardiovascular: Normal rate and regular rhythm.   Pulmonary/Chest: Effort normal and breath sounds normal.  Abdominal: Soft.  Musculoskeletal: Normal range of motion.  Neurological: He is alert and oriented to person, place, and time.  Skin: Skin is  warm and dry.  Nursing note and vitals reviewed.   ED Course  Procedures (including critical care time) Labs Review Labs Reviewed  ETHANOL - Abnormal; Notable for the following:    Alcohol, Ethyl (B) 99 (*)    All other components within normal limits  ACETAMINOPHEN LEVEL - Abnormal; Notable for the following:    Acetaminophen (Tylenol), Serum <10 (*)    All other components within normal limits  URINE RAPID DRUG SCREEN, HOSP PERFORMED - Abnormal; Notable for the following:    Opiates POSITIVE (*)    All other components within normal limits  COMPREHENSIVE METABOLIC PANEL  SALICYLATE LEVEL  CBC    Imaging Review No results found. I have personally reviewed and evaluated these images and lab results as part of my medical decision-making.   EKG Interpretation   Date/Time:  Friday March 07 2015 00:36:08 EDT Ventricular Rate:  69 PR Interval:    QRS Duration: 101 QT Interval:  422 QTC Calculation: 452 R Axis:   -17 Text Interpretation:  Atrial flutter Borderline left axis deviation no  acute ischemia No significant change since last tracing Confirmed by  Gerald Leitz ([id number]) on 03/07/2015 12:49:23 AM     Patient will be observed through the night and reevaluated for suicidality in the morning Patient has been observed through the night, been ambulatory as it passes.  Fluid challenge.  He again has been question about his laterality.  Denies being suicidal or homicidal.  States that the overdose was accidental, I will not refill his prescriptions.  This will have to be done through his primary care physician [facility]   Final diagnoses:  Intoxication  Accidental overdose, initial encounter         Junius Creamer, NP 03/07/15 [place], MD 03/07/15 1324

## 2015-03-07 NOTE — ED Notes (Signed)
Pt. Made aware for the need of urine, unable to give at this time.

## 2015-03-07 NOTE — ED Notes (Signed)
Pt states he accidentally took Trazodone 100mg  tablets, Meloxicam 15mg  tablet, Percocets 5-325mg  tablets, Klonopin 1mg  tablets, all unknown quantity after drinking 80 oz alcohol. Denies SI or thoughts of self harm. AOx3, mildly lethargic.

## 2015-03-07 NOTE — ED Notes (Signed)
Bed: WA03 Expected date:  Expected time:  Means of arrival:  Comments: EMS overdose

## 2015-03-07 NOTE — ED Notes (Signed)
Notified RN,[name] [name] pt. Oxygen level decreased 89%, pt. Placed on 2 liters oxygen.

## 2015-03-07 NOTE — ED Notes (Signed)
Pt up, ambulatory to the bathroom with no assistance, and with steady gait.

## 2015-03-07 NOTE — Discharge Instructions (Signed)
Accidental Overdose °A drug overdose occurs when a chemical substance (drug or medication) is used in amounts large enough to overcome a person. This may result in severe illness or death. This is a type of poisoning. Accidental overdoses of medications or other substances come from a variety of reasons. When this happens accidentally, it is often because the person taking the substance does not know enough about what they have taken. Drugs which commonly cause overdose deaths are alcohol, psychotropic medications (medications which affect the mind), pain medications, illegal drugs (street drugs) such as cocaine and heroin, and multiple drugs taken at the same time. It may result from careless behavior (such as over-indulging at a party). Other causes of overdose may include multiple drug use, a lapse in memory, or drug use after a period of no drug use.  °Sometimes overdosing occurs because a person cannot remember if they have taken their medication.  °A common unintentional overdose in young children involves multi-vitamins containing iron. Iron is a part of the hemoglobin molecule in blood. It is used to transport oxygen to living cells. When taken in small amounts, iron allows the body to restock hemoglobin. In large amounts, it causes problems in the body. If this overdose is not treated, it can lead to death. °Never take medicines that show signs of tampering or do not seem quite right. Never take medicines in the dark or in poor lighting. Read the label and check each dose of medicine before you take it. When adults are poisoned, it happens most often through carelessness or lack of information. Taking medicines in the dark or taking medicine prescribed for someone else to treat the same type of problem is a dangerous practice. °SYMPTOMS  °Symptoms of overdose depend on the medication and amount taken. They can vary from over-activity with stimulant over-dosage, to sleepiness from depressants such as  alcohol, narcotics and tranquilizers. Confusion, dizziness, nausea and vomiting may be present. If problems are severe enough coma and death may result. °DIAGNOSIS  °Diagnosis and management are generally straightforward if the drug is known. Otherwise it is more difficult. At times, certain symptoms and signs exhibited by the patient, or blood tests, can reveal the drug in question.  °TREATMENT  °In an emergency department, most patients can be treated with supportive measures. Antidotes may be available if there has been an overdose of opioids or benzodiazepines. A rapid improvement will often occur if this is the cause of overdose. °At home or away from medical care: °· There may be no immediate problems or warning signs in children. °· Not everything works well in all cases of poisoning. °· Take immediate action. Poisons may act quickly. °· If you think someone has swallowed medicine or a household product, and the person is unconscious, having seizures (convulsions), or is not breathing, immediately call for an ambulance. °IF a person is conscious and appears to be doing OK but has swallowed a poison: °· Do not wait to see what effect the poison will have. Immediately call a poison control center (listed in the white pages of your telephone book under "Poison Control" or inside the front cover with other emergency numbers). Some poison control centers have TTY capability for the deaf. Check with your local center if you or someone in your family requires this service. °· Keep the container so you can read the label on the product for ingredients. °· Describe what, when, and how much was taken and the age and condition of the person poisoned.   Inform them if the person is vomiting, choking, drowsy, shows a change in color or temperature of skin, is conscious or unconscious, or is convulsing.  Do not cause vomiting unless instructed by medical personnel. Do not induce vomiting or force liquids into a person who  is convulsing, unconscious, or very drowsy. Stay calm and in control.   Activated charcoal also is sometimes used in certain types of poisoning and you may wish to add a supply to your emergency medicines. It is available without a prescription. Call a poison control center before using this medication. PREVENTION  Thousands of children die every year from unintentional poisoning. This may be from household chemicals, poisoning from carbon monoxide in a car, taking their parent's medications, or simply taking a few iron pills or vitamins with iron. Poisoning comes from unexpected sources.  Store medicines out of the sight and reach of children, preferably in a locked cabinet. Do not keep medications in a food cabinet. Always store your medicines in a secure place. Get rid of expired medications.  If you have children living with you or have them as occasional guests, you should have child-resistant caps on your medicine containers. Keep everything out of reach. Child proof your home.  If you are called to the telephone or to answer the door while you are taking a medicine, take the container with you or put the medicine out of the reach of small children.  Do not take your medication in front of children. Do not tell your child how good a medication is and how good it is for them. They may get the idea it is more of a treat.  If you are an adult and have accidentally taken an overdose, you need to consider how this happened and what can be done to prevent it from happening again. If this was from a street drug or alcohol, determine if there is a problem that needs addressing. If you are not sure a problems exists, it is easy to talk to a professional and ask them if they think you have a problem. It is better to handle this problem in this way before it happens again and has a much worse consequence.   This information is not intended to replace advice given to you by your health care provider. Make  sure you discuss any questions you have with your health care provider.   Document Released: 07/10/2004 Document Revised: 05/17/2014 Document Reviewed: 10/14/2014 Elsevier Interactive Patient Education 2016 [employer]. Please make appointment with your primary care physician Try to avoid becoming intoxicated Make sure to take your medications as prescribed

## 2015-03-09 ENCOUNTER — Emergency Department (HOSPITAL_COMMUNITY): Payer: Medicaid Other

## 2015-03-09 ENCOUNTER — Encounter (HOSPITAL_COMMUNITY): Payer: Self-pay | Admitting: Emergency Medicine

## 2015-03-09 ENCOUNTER — Emergency Department (HOSPITAL_COMMUNITY)
Admission: EM | Admit: 2015-03-09 | Discharge: 2015-03-10 | Disposition: A | Discharge status: Home / Self Care | Payer: Medicaid Other | Attending: Emergency Medicine | Admitting: Emergency Medicine

## 2015-03-09 DIAGNOSIS — E785 Hyperlipidemia, unspecified: Secondary | ICD-10-CM | POA: Diagnosis not present

## 2015-03-09 DIAGNOSIS — F319 Bipolar disorder, unspecified: Secondary | ICD-10-CM | POA: Insufficient documentation

## 2015-03-09 DIAGNOSIS — Z7951 Long term (current) use of inhaled steroids: Secondary | ICD-10-CM | POA: Diagnosis not present

## 2015-03-09 DIAGNOSIS — S82042A Displaced comminuted fracture of left patella, initial encounter for closed fracture: Secondary | ICD-10-CM | POA: Insufficient documentation

## 2015-03-09 DIAGNOSIS — Z79899 Other long term (current) drug therapy: Secondary | ICD-10-CM | POA: Insufficient documentation

## 2015-03-09 DIAGNOSIS — Z720 Tobacco use: Secondary | ICD-10-CM | POA: Diagnosis not present

## 2015-03-09 DIAGNOSIS — F419 Anxiety disorder, unspecified: Secondary | ICD-10-CM | POA: Insufficient documentation

## 2015-03-09 DIAGNOSIS — Y92410 Unspecified street and highway as the place of occurrence of the external cause: Secondary | ICD-10-CM | POA: Insufficient documentation

## 2015-03-09 DIAGNOSIS — K219 Gastro-esophageal reflux disease without esophagitis: Secondary | ICD-10-CM | POA: Diagnosis not present

## 2015-03-09 DIAGNOSIS — S82892A Other fracture of left lower leg, initial encounter for closed fracture: Secondary | ICD-10-CM

## 2015-03-09 DIAGNOSIS — M1990 Unspecified osteoarthritis, unspecified site: Secondary | ICD-10-CM | POA: Insufficient documentation

## 2015-03-09 DIAGNOSIS — Y998 Other external cause status: Secondary | ICD-10-CM | POA: Insufficient documentation

## 2015-03-09 DIAGNOSIS — J449 Chronic obstructive pulmonary disease, unspecified: Secondary | ICD-10-CM | POA: Insufficient documentation

## 2015-03-09 DIAGNOSIS — Y9389 Activity, other specified: Secondary | ICD-10-CM | POA: Insufficient documentation

## 2015-03-09 DIAGNOSIS — S8991XA Unspecified injury of right lower leg, initial encounter: Secondary | ICD-10-CM | POA: Diagnosis present

## 2015-03-09 DIAGNOSIS — R4182 Altered mental status, unspecified: Secondary | ICD-10-CM | POA: Insufficient documentation

## 2015-03-09 DIAGNOSIS — E039 Hypothyroidism, unspecified: Secondary | ICD-10-CM | POA: Diagnosis not present

## 2015-03-09 LAB — CBC WITH DIFFERENTIAL/PLATELET
BASOS ABS: 0.1 10*3/uL (ref 0.0–0.1)
BASOS PCT: 1 %
Eosinophils Absolute: 0.4 10*3/uL (ref 0.0–0.7)
Eosinophils Relative: 4 %
HEMATOCRIT: 43.5 % (ref 39.0–52.0)
HEMOGLOBIN: 14.9 g/dL (ref 13.0–17.0)
LYMPHS PCT: 38 %
Lymphs Abs: 3.8 10*3/uL (ref 0.7–4.0)
MCH: 31.2 pg (ref 26.0–34.0)
MCHC: 34.3 g/dL (ref 30.0–36.0)
MCV: 91 fL (ref 78.0–100.0)
MONO ABS: 1 10*3/uL (ref 0.1–1.0)
Monocytes Relative: 10 %
NEUTROS ABS: 4.8 10*3/uL (ref 1.7–7.7)
NEUTROS PCT: 47 %
Platelets: 311 10*3/uL (ref 150–400)
RBC: 4.78 MIL/uL (ref 4.22–5.81)
RDW: 13.9 % (ref 11.5–15.5)
WBC: 10 10*3/uL (ref 4.0–10.5)

## 2015-03-09 LAB — ETHANOL

## 2015-03-09 LAB — COMPREHENSIVE METABOLIC PANEL
ALT: 31 U/L (ref 17–63)
ANION GAP: 8 (ref 5–15)
AST: 28 U/L (ref 15–41)
Albumin: 3.6 g/dL (ref 3.5–5.0)
Alkaline Phosphatase: 44 U/L (ref 38–126)
BILIRUBIN TOTAL: 0.6 mg/dL (ref 0.3–1.2)
BUN: 15 mg/dL (ref 6–20)
CHLORIDE: 105 mmol/L (ref 101–111)
CO2: 21 mmol/L — ABNORMAL LOW (ref 22–32)
Calcium: 8.6 mg/dL — ABNORMAL LOW (ref 8.9–10.3)
Creatinine, Ser: 1.03 mg/dL (ref 0.61–1.24)
Glucose, Bld: 123 mg/dL — ABNORMAL HIGH (ref 65–99)
POTASSIUM: 4.3 mmol/L (ref 3.5–5.1)
Sodium: 134 mmol/L — ABNORMAL LOW (ref 135–145)
TOTAL PROTEIN: 6.3 g/dL — AB (ref 6.5–8.1)

## 2015-03-09 MED ORDER — HYDROMORPHONE HCL 1 MG/ML IJ SOLN
1.0000 mg | Freq: Once | INTRAMUSCULAR | Status: AC
  Administered 2015-03-09: 1 mg via INTRAVENOUS
  Filled 2015-03-09: qty 1
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
## 2015-03-10 ENCOUNTER — Encounter (HOSPITAL_COMMUNITY): Payer: Self-pay | Admitting: Radiology

## 2015-03-10 ENCOUNTER — Emergency Department (HOSPITAL_COMMUNITY): Payer: Medicaid Other

## 2015-03-10 MED ORDER — OXYCODONE-ACETAMINOPHEN 5-325 MG PO TABS
1.0000 | ORAL_TABLET | Freq: Once | ORAL | Status: AC
  Administered 2015-03-10: 1 via ORAL
  Filled 2015-03-10: qty 1

## 2015-03-10 MED ORDER — HYDROCODONE-ACETAMINOPHEN 5-325 MG PO TABS: 1.0000 | ORAL_TABLET | Freq: Four times a day (QID) | ORAL | Status: DC | PRN
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
## 2015-03-10 NOTE — ED Notes (Signed)
Xray came to get pt but pt is currently in CT

## 2015-03-10 NOTE — Discharge Instructions (Signed)
As discussed, with your knee fracture, it is very important that you contact your orthopedist tomorrow.  Return here for concerning changes in your condition.
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
## 2015-03-10 NOTE — ED Notes (Signed)
This RN demonstrated how to use crutches for the pt, and asked the pt to do a return demonstration. Pt took 1 step and  cried out in pain, and refused to take another step with the crutches. Pt states that his toes hurt too bad. However, pt transferred easily into a wheel chair without any help.

## 2015-03-11 ENCOUNTER — Encounter (HOSPITAL_COMMUNITY): Payer: Self-pay

## 2015-03-11 ENCOUNTER — Emergency Department (HOSPITAL_COMMUNITY): Payer: Medicaid Other

## 2015-03-11 ENCOUNTER — Emergency Department (HOSPITAL_COMMUNITY)
Admission: EM | Admit: 2015-03-11 | Discharge: 2015-03-11 | Disposition: A | Discharge status: Home / Self Care | Payer: Medicaid Other | Attending: Emergency Medicine | Admitting: Emergency Medicine

## 2015-03-11 DIAGNOSIS — J449 Chronic obstructive pulmonary disease, unspecified: Secondary | ICD-10-CM | POA: Insufficient documentation

## 2015-03-11 DIAGNOSIS — S82202D Unspecified fracture of shaft of left tibia, subsequent encounter for closed fracture with routine healing: Secondary | ICD-10-CM | POA: Diagnosis not present

## 2015-03-11 DIAGNOSIS — Z79899 Other long term (current) drug therapy: Secondary | ICD-10-CM | POA: Diagnosis not present

## 2015-03-11 DIAGNOSIS — E039 Hypothyroidism, unspecified: Secondary | ICD-10-CM | POA: Insufficient documentation

## 2015-03-11 DIAGNOSIS — Z8669 Personal history of other diseases of the nervous system and sense organs: Secondary | ICD-10-CM | POA: Diagnosis not present

## 2015-03-11 DIAGNOSIS — Z8739 Personal history of other diseases of the musculoskeletal system and connective tissue: Secondary | ICD-10-CM | POA: Insufficient documentation

## 2015-03-11 DIAGNOSIS — K219 Gastro-esophageal reflux disease without esophagitis: Secondary | ICD-10-CM | POA: Insufficient documentation

## 2015-03-11 DIAGNOSIS — Z7951 Long term (current) use of inhaled steroids: Secondary | ICD-10-CM | POA: Diagnosis not present

## 2015-03-11 DIAGNOSIS — F319 Bipolar disorder, unspecified: Secondary | ICD-10-CM | POA: Insufficient documentation

## 2015-03-11 DIAGNOSIS — S8992XD Unspecified injury of left lower leg, subsequent encounter: Secondary | ICD-10-CM | POA: Diagnosis present

## 2015-03-11 DIAGNOSIS — Z720 Tobacco use: Secondary | ICD-10-CM | POA: Insufficient documentation

## 2015-03-11 DIAGNOSIS — W06XXXD Fall from bed, subsequent encounter: Secondary | ICD-10-CM | POA: Insufficient documentation

## 2015-03-11 DIAGNOSIS — N529 Male erectile dysfunction, unspecified: Secondary | ICD-10-CM | POA: Insufficient documentation

## 2015-03-11 DIAGNOSIS — F419 Anxiety disorder, unspecified: Secondary | ICD-10-CM | POA: Insufficient documentation

## 2015-03-11 DIAGNOSIS — S82142D Displaced bicondylar fracture of left tibia, subsequent encounter for closed fracture with routine healing: Secondary | ICD-10-CM

## 2015-03-11 MED ORDER — OXYCODONE HCL 5 MG PO TABS: 5.0000 mg | ORAL_TABLET | Freq: Four times a day (QID) | ORAL | Status: DC | PRN

## 2015-03-11 MED ORDER — OXYCODONE HCL 5 MG PO TABS
5.0000 mg | ORAL_TABLET | Freq: Once | ORAL | Status: AC
  Administered 2015-03-11: 5 mg via ORAL
  Filled 2015-03-11: qty 1
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
## 2015-03-11 NOTE — Discharge Instructions (Signed)
° °Displaced Tibial Plateau Fracture °A tibial plateau fracture is a break in the bone that forms the bottom of your knee joint (tibia or shin bone). The lower end of your thigh bone (femur) forms the upper surface of your knee joint. The top of the tibia has a flat, smooth surface (tibial plateau). This part of the shin bone is made of softer bone than the shaft of the shin bone. If a strong force shoves your femur down into your tibial plateau, the tibial plateau can collapse or break away at the edges. This is also called an intra-articular fracture. °A displaced tibial plateau fracture means that one or more pieces of your tibial plateau have been moved out of normal position. Without treatment, this fracture can make your knee unstable. It can also lead to arthritis or difficulty walking. °CAUSES °Common causes of this type of fracture include: °· Car accidents. °· Jumps or falls from a significant height. °· Injuries from high-energy sports or contact sports. °RISK FACTORS °You may be at higher risk for this type of fracture if: °· You play high-energy sports or contact sports. °· You have a history of bone infections. °· You are an older person with a condition that causes weak bones (osteoporosis). °SIGNS AND SYMPTOMS °Signs and symptoms of a displaced tibial plateau fracture begin immediately after the injury. They may include: °· Pain that is worse when you use your knee to support your body weight. °· Swelling of your knee. °· Bruising around your knee. °Other symptoms may include: °· Your knee looking out of place (deformity). °· Your foot looking pale and feeling cool to the touch. °· Having a feeling of pins and needles around your foot. °DIAGNOSIS °A displaced tibial plateau fracture can be diagnosed with X-rays. Sometimes, a CT scan is done to help your health care provider: °· To identify how much the bone has moved out of place. °· To find any broken-off pieces of bone. °TREATMENT °Treatment for a  displaced tibial plateau fracture is surgery to put the pieces of broken bone back into place (open reduction). This surgery is usually done as soon as possible.  °While you are waiting for surgery, your health care provider may prescribe pain medicines. °HOME CARE INSTRUCTIONS °If you are sent home before surgery, follow these instructions: °Managing Pain, Stiffness, and Swelling °· If directed, apply ice to the injured area: °¨ Put ice in a plastic bag. °¨ Place a towel between your skin and the bag. °¨ Leave the ice on for 20 minutes, 2-3 times a day. °· Raise the injured area above the level of your heart while you are sitting or lying down. °Driving °· Do not drive or operate heavy machinery while you are taking pain medicine. °Activity °· Return to your normal activities as directed by your health care provider. Ask your health care provider what activities are safe for you. °· Perform range-of-motion exercises only as directed by your health care provider. °Safety °· Do not use your injured limb to support your body weight until your health care provider says that you can. Use crutches or a walker as directed by your health care provider. °General Instructions °· Do not use any tobacco products, including cigarettes, chewing tobacco, or electronic cigarettes. Tobacco can delay bone healing. If you need help quitting, ask your health care provider. °· Take medicines only as directed by your health care provider. °· Keep all follow-up visits as directed by your health care provider. This is   important. °SEEK MEDICAL CARE IF: °Any of these things happen while you are waiting for surgery: °· Your pain medicine is not helping. °· You develop chills or a fever. °SEEK IMMEDIATE MEDICAL CARE IF:  °Any of these things happen while you are waiting for surgery: °· You have severe pain or swelling. °· Your calf feels warm and is swollen and painful. °· You have chest pain. °· You have trouble breathing. °  °This  information is not intended to replace advice given to you by your health care provider. Make sure you discuss any questions you have with your health care provider. °  °Document Released: 01/16/2002 Document Revised: 05/17/2014 Document Reviewed: 12/12/2013 °Elsevier Interactive Patient Education ©2016 [employer] ° °
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
## 2015-03-11 NOTE — ED Notes (Signed)
Patient transported to X-ray 

## 2015-06-20 ENCOUNTER — Emergency Department (HOSPITAL_COMMUNITY)
Admission: EM | Admit: 2015-06-20 | Discharge: 2015-06-20 | Disposition: A | Discharge status: Home / Self Care | Payer: Medicaid Other | Attending: Emergency Medicine | Admitting: Emergency Medicine

## 2015-06-20 ENCOUNTER — Emergency Department (HOSPITAL_COMMUNITY): Payer: Medicaid Other

## 2015-06-20 DIAGNOSIS — Y92480 Sidewalk as the place of occurrence of the external cause: Secondary | ICD-10-CM | POA: Insufficient documentation

## 2015-06-20 DIAGNOSIS — W108XXA Fall (on) (from) other stairs and steps, initial encounter: Secondary | ICD-10-CM | POA: Insufficient documentation

## 2015-06-20 DIAGNOSIS — F17210 Nicotine dependence, cigarettes, uncomplicated: Secondary | ICD-10-CM | POA: Diagnosis not present

## 2015-06-20 DIAGNOSIS — F319 Bipolar disorder, unspecified: Secondary | ICD-10-CM | POA: Insufficient documentation

## 2015-06-20 DIAGNOSIS — K219 Gastro-esophageal reflux disease without esophagitis: Secondary | ICD-10-CM | POA: Diagnosis not present

## 2015-06-20 DIAGNOSIS — Z791 Long term (current) use of non-steroidal anti-inflammatories (NSAID): Secondary | ICD-10-CM | POA: Diagnosis not present

## 2015-06-20 DIAGNOSIS — F419 Anxiety disorder, unspecified: Secondary | ICD-10-CM | POA: Insufficient documentation

## 2015-06-20 DIAGNOSIS — J449 Chronic obstructive pulmonary disease, unspecified: Secondary | ICD-10-CM | POA: Insufficient documentation

## 2015-06-20 DIAGNOSIS — Z7951 Long term (current) use of inhaled steroids: Secondary | ICD-10-CM | POA: Diagnosis not present

## 2015-06-20 DIAGNOSIS — Z8781 Personal history of (healed) traumatic fracture: Secondary | ICD-10-CM | POA: Insufficient documentation

## 2015-06-20 DIAGNOSIS — G4700 Insomnia, unspecified: Secondary | ICD-10-CM | POA: Insufficient documentation

## 2015-06-20 DIAGNOSIS — W19XXXA Unspecified fall, initial encounter: Secondary | ICD-10-CM

## 2015-06-20 DIAGNOSIS — Z87438 Personal history of other diseases of male genital organs: Secondary | ICD-10-CM | POA: Diagnosis not present

## 2015-06-20 DIAGNOSIS — E039 Hypothyroidism, unspecified: Secondary | ICD-10-CM | POA: Insufficient documentation

## 2015-06-20 DIAGNOSIS — Y998 Other external cause status: Secondary | ICD-10-CM | POA: Diagnosis not present

## 2015-06-20 DIAGNOSIS — S8992XA Unspecified injury of left lower leg, initial encounter: Secondary | ICD-10-CM | POA: Diagnosis not present

## 2015-06-20 DIAGNOSIS — M1990 Unspecified osteoarthritis, unspecified site: Secondary | ICD-10-CM | POA: Diagnosis not present

## 2015-06-20 DIAGNOSIS — Y9389 Activity, other specified: Secondary | ICD-10-CM | POA: Diagnosis not present

## 2015-06-20 DIAGNOSIS — Z79899 Other long term (current) drug therapy: Secondary | ICD-10-CM | POA: Diagnosis not present

## 2015-06-20 DIAGNOSIS — M25562 Pain in left knee: Secondary | ICD-10-CM
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
## 2015-06-20 NOTE — Discharge Instructions (Signed)
Cryotherapy °Cryotherapy means treatment with cold. Ice or gel packs can be used to reduce both pain and swelling. Ice is the most helpful within the first 24 to 48 hours after an injury or flare-up from overusing a muscle or joint. Sprains, strains, spasms, burning pain, shooting pain, and aches can all be eased with ice. Ice can also be used when recovering from surgery. Ice is effective, has very few side effects, and is safe for most people to use. °PRECAUTIONS  °Ice is not a safe treatment option for people with: °· Raynaud phenomenon. This is a condition affecting small blood vessels in the extremities. Exposure to cold may cause your problems to return. °· Cold hypersensitivity. There are many forms of cold hypersensitivity, including: °¨ Cold urticaria. Red, itchy hives appear on the skin when the tissues begin to warm after being iced. °¨ Cold erythema. This is a red, itchy rash caused by exposure to cold. °¨ Cold hemoglobinuria. Red blood cells break down when the tissues begin to warm after being iced. The hemoglobin that carry oxygen are passed into the urine because they cannot combine with blood proteins fast enough. °· Numbness or altered sensitivity in the area being iced. °If you have any of the following conditions, do not use ice until you have discussed cryotherapy with your caregiver: °· Heart conditions, such as arrhythmia, angina, or chronic heart disease. °· High blood pressure. °· Healing wounds or open skin in the area being iced. °· Current infections. °· Rheumatoid arthritis. °· Poor circulation. °· Diabetes. °Ice slows the blood flow in the region it is applied. This is beneficial when trying to stop inflamed tissues from spreading irritating chemicals to surrounding tissues. However, if you expose your skin to cold temperatures for too long or without the proper protection, you can damage your skin or nerves. Watch for signs of skin damage due to cold. °HOME CARE INSTRUCTIONS °Follow  these tips to use ice and cold packs safely. °· Place a dry or damp towel between the ice and skin. A damp towel will cool the skin more quickly, so you may need to shorten the time that the ice is used. °· For a more rapid response, add gentle compression to the ice. °· Ice for no more than 10 to 20 minutes at a time. The bonier the area you are icing, the less time it will take to get the benefits of ice. °· Check your skin after 5 minutes to make sure there are no signs of a poor response to cold or skin damage. °· Rest 20 minutes or more between uses. °· Once your skin is numb, you can end your treatment. You can test numbness by very lightly touching your skin. The touch should be so light that you do not see the skin dimple from the pressure of your fingertip. When using ice, most people will feel these normal sensations in this order: cold, burning, aching, and numbness. °· Do not use ice on someone who cannot communicate their responses to pain, such as small children or people with dementia. °HOW TO MAKE AN ICE PACK °Ice packs are the most common way to use ice therapy. Other methods include ice massage, ice baths, and cryosprays. Muscle creams that cause a cold, tingly feeling do not offer the same benefits that ice offers and should not be used as a substitute unless recommended by your caregiver. °To make an ice pack, do one of the following: °· Place crushed ice or a   bag of frozen vegetables in a sealable plastic bag. Squeeze out the excess air. Place this bag inside another plastic bag. Slide the bag into a pillowcase or place a damp towel between your skin and the bag.  Mix 3 parts water with 1 part rubbing alcohol. Freeze the mixture in a sealable plastic bag. When you remove the mixture from the freezer, it will be slushy. Squeeze out the excess air. Place this bag inside another plastic bag. Slide the bag into a pillowcase or place a damp towel between your skin and the bag. SEEK MEDICAL CARE  IF:  You develop white spots on your skin. This may give the skin a blotchy (mottled) appearance.  Your skin turns blue or pale.  Your skin becomes waxy or hard.  Your swelling gets worse. MAKE SURE YOU:   Understand these instructions.  Will watch your condition.  Will get help right away if you are not doing well or get worse.   This information is not intended to replace advice given to you by your health care provider. Make sure you discuss any questions you have with your health care provider.   Document Released: 12/21/2010 Document Revised: 05/17/2014 Document Reviewed: 12/21/2010 Elsevier Interactive Patient Education 2016 [employer]  Knee Pain Knee pain is a common problem. It can have many causes. The pain often goes away by following your doctor's home care instructions. Treatment for ongoing pain will depend on the cause of your pain. If your knee pain continues, more tests may be needed to diagnose your condition. Tests may include X-rays or other imaging studies of your knee. HOME CARE  Take medicines only as told by your doctor.  Rest your knee and keep it raised (elevated) while you are resting.  Do not do things that cause pain or make your pain worse.  Avoid activities where both feet leave the ground at the same time, such as running, jumping rope, or doing jumping jacks.  Apply ice to the knee area:  Put ice in a plastic bag.  Place a towel between your skin and the bag.  Leave the ice on for 20 minutes, 2-3 times a day.  Ask your doctor if you should wear an elastic knee support.  Sleep with a pillow under your knee.  Lose weight if you are overweight. Being overweight can make your knee hurt more.  Do not use any tobacco products, including cigarettes, chewing tobacco, or electronic cigarettes. If you need help quitting, ask your doctor. Smoking may slow the healing of any bone and joint problems that you may have. GET HELP IF:  Your knee  pain does not stop, it changes, or it gets worse.  You have a fever along with knee pain.  Your knee gives out or locks up.  Your knee becomes more swollen. GET HELP RIGHT AWAY IF:   Your knee feels hot to the touch.  You have chest pain or trouble breathing.   This information is not intended to replace advice given to you by your health care provider. Make sure you discuss any questions you have with your health care provider.   Follow-up with orthopedic provider for reevaluation. Keep knee brace on as much as possible. Keep leg elevated. Apply ice to affected area. Take ibuprofen as needed for pain. Return to the emergency department if you expands worsening of her symptoms, increased swelling, chest pain, shortness of breath, redness or warmth of your knee, numbness or tingling in your extremity.
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
## 2015-06-29 ENCOUNTER — Emergency Department (HOSPITAL_COMMUNITY)
Admission: EM | Admit: 2015-06-29 | Discharge: 2015-06-30 | Disposition: A | Discharge status: Home / Self Care | Payer: Medicaid Other | Attending: Emergency Medicine | Admitting: Emergency Medicine

## 2015-06-29 ENCOUNTER — Encounter (HOSPITAL_COMMUNITY): Payer: Self-pay | Admitting: Family Medicine

## 2015-06-29 DIAGNOSIS — F17210 Nicotine dependence, cigarettes, uncomplicated: Secondary | ICD-10-CM | POA: Diagnosis not present

## 2015-06-29 DIAGNOSIS — Z87438 Personal history of other diseases of male genital organs: Secondary | ICD-10-CM | POA: Diagnosis not present

## 2015-06-29 DIAGNOSIS — J449 Chronic obstructive pulmonary disease, unspecified: Secondary | ICD-10-CM | POA: Insufficient documentation

## 2015-06-29 DIAGNOSIS — E039 Hypothyroidism, unspecified: Secondary | ICD-10-CM | POA: Diagnosis not present

## 2015-06-29 DIAGNOSIS — M1990 Unspecified osteoarthritis, unspecified site: Secondary | ICD-10-CM | POA: Diagnosis not present

## 2015-06-29 DIAGNOSIS — F439 Reaction to severe stress, unspecified: Secondary | ICD-10-CM

## 2015-06-29 DIAGNOSIS — Z7951 Long term (current) use of inhaled steroids: Secondary | ICD-10-CM | POA: Insufficient documentation

## 2015-06-29 DIAGNOSIS — K219 Gastro-esophageal reflux disease without esophagitis: Secondary | ICD-10-CM | POA: Diagnosis not present

## 2015-06-29 DIAGNOSIS — Z79899 Other long term (current) drug therapy: Secondary | ICD-10-CM | POA: Diagnosis not present

## 2015-06-29 DIAGNOSIS — G4700 Insomnia, unspecified: Secondary | ICD-10-CM | POA: Insufficient documentation

## 2015-06-29 DIAGNOSIS — F430 Acute stress reaction: Secondary | ICD-10-CM | POA: Diagnosis not present

## 2015-06-29 DIAGNOSIS — F419 Anxiety disorder, unspecified: Secondary | ICD-10-CM | POA: Diagnosis present

## 2015-06-29 DIAGNOSIS — F319 Bipolar disorder, unspecified: Secondary | ICD-10-CM | POA: Insufficient documentation

## 2015-06-29 DIAGNOSIS — Z791 Long term (current) use of non-steroidal anti-inflammatories (NSAID): Secondary | ICD-10-CM | POA: Insufficient documentation

## 2015-06-29 NOTE — ED Notes (Signed)
Pt reports is having depression and anxiety. Pt reports has not took his Klonopin in 3 days and can not get his medication filled for another 2 days. Pt reports he is unsure of when he takes his medication and give this as the reason for not being on schedule with is Klonopin. Pt reports he is under stress with caring for his mother. Also, reports he started Depakote 3 days for a mood stabilizer. [facility] prescribed the Depakote. Pt is shaking and appears anxious about his stress with his mother.

## 2015-06-30 ENCOUNTER — Emergency Department (HOSPITAL_COMMUNITY)
Admission: EM | Admit: 2015-06-30 | Discharge: 2015-06-30 | Disposition: A | Discharge status: Home / Self Care | Payer: Medicaid Other | Source: Home / Self Care | Attending: Emergency Medicine | Admitting: Emergency Medicine

## 2015-06-30 ENCOUNTER — Encounter (HOSPITAL_COMMUNITY): Payer: Self-pay | Admitting: Emergency Medicine

## 2015-06-30 DIAGNOSIS — R109 Unspecified abdominal pain: Secondary | ICD-10-CM | POA: Insufficient documentation

## 2015-06-30 DIAGNOSIS — G8929 Other chronic pain: Secondary | ICD-10-CM

## 2015-06-30 DIAGNOSIS — F439 Reaction to severe stress, unspecified: Secondary | ICD-10-CM | POA: Insufficient documentation

## 2015-06-30 DIAGNOSIS — J449 Chronic obstructive pulmonary disease, unspecified: Secondary | ICD-10-CM

## 2015-06-30 DIAGNOSIS — F17210 Nicotine dependence, cigarettes, uncomplicated: Secondary | ICD-10-CM | POA: Insufficient documentation

## 2015-06-30 DIAGNOSIS — F410 Panic disorder [episodic paroxysmal anxiety] without agoraphobia: Secondary | ICD-10-CM | POA: Insufficient documentation

## 2015-06-30 DIAGNOSIS — M542 Cervicalgia: Secondary | ICD-10-CM

## 2015-06-30 MED ORDER — HYDROCODONE-ACETAMINOPHEN 5-325 MG PO TABS
1.0000 | ORAL_TABLET | Freq: Once | ORAL | Status: AC
  Administered 2015-06-30: 1 via ORAL
  Filled 2015-06-30: qty 1

## 2015-06-30 NOTE — ED Provider Notes (Signed)
CSN: [id number]     Arrival date & time 06/29/15  2311 History   First MD Initiated Contact with Patient 06/30/15 0111     Chief Complaint  Patient presents with  . Depression  . Anxiety     (Consider location/radiation/quality/duration/timing/severity/associated sxs/prior Treatment) HPI Comments: 60 year old male with history of anxiety, COPD, bipolar 1 disorder, depression, esophageal reflux, and dyslipidemia presents to the ED for evaluation of anxiety and agitation. Patient states that he has had difficulty controlling his anxiety at home secondary to his brother acting out. He states that this has been playing a lot of stress on his mother and, in turn, placing a lot of stress on himself. Patient states, "I just can't take it anymore" when referring to his brother's actions and the fact that it causes the patient stress. He states that he has had a difficult time sleeping lately because of arguments with his brother and the stress within his household. Patient has been out of his Klonopin for 2 days. He has a refill due to be picked up tomorrow for his Klonopin. The patient has also been started on Depakote as a mood stabilizer. He has been on Depakote for 3 days and is unable to state whether he feels as though this medication is helping him yet. Patient denies suicidal and homicidal thoughts. He states that his anxiety and stress level had greatly improved since he arrived in the emergency department because he was able to "get away from" all of the stress at home.  Patient is a 60 y.o. male presenting with depression and anxiety. The history is provided by the patient. No language interpreter was used.  Depression  Anxiety    Past Medical History  Diagnosis Date  . SOB (shortness of breath)   . Dizziness   . Anxiety   . Arthritis   . Asthma   . COPD (chronic obstructive pulmonary disease) ([place])   . Hypothyroidism   . Bipolar 1 disorder ([place])   . Chronic insomnia   . Depression    . Diaphragmatic hernia   . ED (erectile dysfunction)   . GERD (gastroesophageal reflux disease)   . HLD (hyperlipidemia)   . Umbilical hernia without obstruction and without gangrene   . Tobacco use    Past Surgical History  Procedure Laterality Date  . Colonoscopy    . Upper gi endoscopy    . Umbilical hernia repair N/A 01/01/2013    Procedure: HERNIA REPAIR UMBILICAL ADULT;  Surgeon: Merrie Roof, MD;  Location: [facility];  Service: General;  Laterality: N/A;  . Insertion of mesh N/A 01/01/2013    Procedure: INSERTION OF MESH;  Surgeon: Merrie Roof, MD;  Location: [facility];  Service: General;  Laterality: N/A;   Family History  Problem Relation Age of Onset  . Arrhythmia    . ALS    . Prostate cancer Maternal Grandfather   . Colon cancer Paternal Grandfather   . Heart disease Mother    Social History  Substance Use Topics  . Smoking status: Current Every Day Smoker -- 2.00 packs/day for 40 years    Types: Cigarettes  . Smokeless tobacco: Never Used  . Alcohol Use: Yes     Comment: "Never, hardly one at all"    Review of Systems  Psychiatric/Behavioral: Positive for depression, sleep disturbance and agitation. Negative for suicidal ideas.  All other systems reviewed and are negative.   Allergies  Codeine; Lipitor; Lithium carbonate; Pollen extract; and Talwin  Home Medications   Prior to Admission medications   Medication Sig Start Date End Date Taking? Authorizing Provider  albuterol (PROVENTIL HFA;VENTOLIN HFA) 108 (90 BASE) MCG/ACT inhaler Inhale 1-2 puffs into the lungs 4 (four) times daily as needed for wheezing or shortness of breath (wheezing).    Yes Historical Provider, MD  divalproex (DEPAKOTE) 250 MG DR tablet Take 750 mg by mouth at bedtime. 06/27/15  Yes Historical Provider, MD  esomeprazole (NEXIUM) 40 MG capsule Take 40 mg by mouth daily at 12 noon.   Yes Historical Provider, MD  fluticasone (FLONASE) 50 MCG/ACT nasal spray Place 2 sprays into both  nostrils daily. 02/03/15  Yes Historical Provider, MD  levothyroxine (SYNTHROID, LEVOTHROID) 150 MCG tablet Take 150 mcg by mouth every morning.    Yes Historical Provider, MD  meloxicam (MOBIC) 15 MG tablet Take 15 mg by mouth daily. 06/03/15  Yes Historical Provider, MD  risperiDONE (RISPERDAL) 2 MG tablet Take 2 mg by mouth at bedtime.    Yes Historical Provider, MD  clonazePAM (KLONOPIN) 1 MG tablet Take 1 mg by mouth 3 (three) times daily as needed for anxiety.  02/24/15   Historical Provider, MD  HYDROcodone-acetaminophen (NORCO/VICODIN) 5-325 MG tablet Take 1 tablet by mouth every 6 (six) hours as needed for severe pain. Patient not taking: Reported on 06/30/2015 03/10/15   Carmin Muskrat, MD  oxyCODONE (OXY IR/ROXICODONE) 5 MG immediate release tablet Take 1 tablet (5 mg total) by mouth every 6 (six) hours as needed for breakthrough pain. Patient not taking: Reported on 06/30/2015 03/11/15   Leo Grosser, MD   BP 121/72 mmHg  Pulse 87  Temp(Src) 98 F (36.7 C) (Oral)  Resp 20  Ht 5\' 7"  (1.702 m)  Wt 73.483 kg  BMI 25.37 kg/m2  SpO2 98%   Physical Exam  Constitutional: He is oriented to person, place, and time. He appears well-developed and well-nourished. No distress.  Nontoxic/nonseptic appearing  HENT:  Head: Normocephalic and atraumatic.  Eyes: Conjunctivae and EOM are normal. No scleral icterus.  Neck: Normal range of motion.  Cardiovascular: Normal rate, regular rhythm and intact distal pulses.   Pulmonary/Chest: Effort normal. No respiratory distress. He has no wheezes.  Respirations even and unlabored  Musculoskeletal: Normal range of motion.  Neurological: He is alert and oriented to person, place, and time. He exhibits normal muscle tone. Coordination normal.  GCS 15. Speech is goal oriented. Patient moving all extremities.  Skin: Skin is warm and dry. No rash noted. He is not diaphoretic. No erythema. No pallor.  Psychiatric: He has a normal mood and affect. His speech  is normal and behavior is normal. He expresses no homicidal and no suicidal ideation.  Patient denies SI/HI Patient is not anxious appearing. Was sleeping initially.  Nursing note and vitals reviewed.   ED Course  Procedures (including critical care time) Labs Review Labs Reviewed - No data to display  Imaging Review No results found.   I have personally reviewed and evaluated these images and lab results as part of my medical decision-making.   EKG Interpretation None      MDM   Final diagnoses:  Stress at home    60 year old male presents to the ED for evaluation of worsening stress and anxiety. His symptoms have improved since ED arrival as, he states, he was able to "get away from" his stressful home environment. Patient complains mostly of verbal disagreements with his brother and his brother "acting out". Patient denies suicidal or homicidal thoughts. He  is due to pick up a refill of his Klonopin tomorrow. No other complaints for this visit. Patient resting comfortably in the emergency department, in no distress. No indication for psychiatric evaluation. Will discharge with instructions for PCP follow-up.    Antonietta Breach, PA-C 06/30/15 [id number]  Varney Biles, MD 07/01/15 2130

## 2015-06-30 NOTE — ED Notes (Addendum)
Pt complaint of chronic abdominal pain and neck pain related to "panic attack and stress;" pt seen here yesterday for same.

## 2015-06-30 NOTE — Discharge Instructions (Signed)
Stress and Stress Management °Stress is a normal reaction to life events. It is what you feel when life demands more than you are used to or more than you can handle. Some stress can be useful. For example, the stress reaction can help you catch the last bus of the day, study for a test, or meet a deadline at work. But stress that occurs too often or for too long can cause problems. It can affect your emotional health and interfere with relationships and normal daily activities. Too much stress can weaken your immune system and increase your risk for physical illness. If you already have a medical problem, stress can make it worse. °CAUSES  °All sorts of life events may cause stress. An event that causes stress for one person may not be stressful for another person. Major life events commonly cause stress. These may be positive or negative. Examples include losing your job, moving into a new home, getting married, having a baby, or losing a loved one. Less obvious life events may also cause stress, especially if they occur day after day or in combination. Examples include working long hours, driving in traffic, caring for children, being in debt, or being in a difficult relationship. °SIGNS AND SYMPTOMS °Stress may cause emotional symptoms including, the following: °· Anxiety. This is feeling worried, afraid, on edge, overwhelmed, or out of control. °· Anger. This is feeling irritated or impatient. °· Depression. This is feeling sad, down, helpless, or guilty. °· Difficulty focusing, remembering, or making decisions. °Stress may cause physical symptoms, including the following:  °· Aches and pains. These may affect your head, neck, back, stomach, or other areas of your body. °· Tight muscles or clenched jaw. °· Low energy or trouble sleeping.  °Stress may cause unhealthy behaviors, including the following:  °· Eating to feel better (overeating) or skipping meals. °· Sleeping too little, too much, or both. °· Working  too much or putting off tasks (procrastination). °· Smoking, drinking alcohol, or using drugs to feel better. °DIAGNOSIS  °Stress is diagnosed through an assessment by your health care provider. Your health care provider will ask questions about your symptoms and any stressful life events. Your health care provider will also ask about your medical history and may order blood tests or other tests. Certain medical conditions and medicine can cause physical symptoms similar to stress.  Mental illness can cause emotional symptoms and unhealthy behaviors similar to stress. Your health care provider may refer you to a mental health professional for further evaluation.  °TREATMENT  °Stress management is the recommended treatment for stress. The goals of stress management are reducing stressful life events and coping with stress in healthy ways.  °Techniques for reducing stressful life events include the following: °· Stress identification. Self-monitor for stress and identify what causes stress for you. These skills may help you to avoid some stressful events. °· Time management. Set your priorities, keep a calendar of events, and learn to say "no." These tools can help you avoid making too many commitments. °Techniques for coping with stress include the following: °· Rethinking the problem. Try to think realistically about stressful events rather than ignoring them or overreacting. Try to find the positives in a stressful situation rather than focusing on the negatives. °· Exercise. Physical exercise can release both physical and emotional tension. The key is to find a form of exercise you enjoy and do it regularly. °· Relaxation techniques. These relax the body and mind. Examples include yoga, meditation, tai chi, biofeedback, deep   breathing, progressive muscle relaxation, listening to music, being out in nature, journaling, and other hobbies. Again, the key is to find one or more that you enjoy and can do  regularly.  Healthy lifestyle. Eat a balanced diet, get plenty of sleep, and do not smoke. Avoid using alcohol or drugs to relax.  Strong support network. Spend time with family, friends, or other people you enjoy being around.Express your feelings and talk things over with someone you trust. Counseling or talktherapy with a mental health professional may be helpful if you are having difficulty managing stress on your own. Medicine is typically not recommended for the treatment of stress.Talk to your health care provider if you think you need medicine for symptoms of stress. HOME CARE INSTRUCTIONS  Keep all follow-up visits as directed by your health care provider.  Take all medicines as directed by your health care provider. SEEK MEDICAL CARE IF:  Your symptoms get worse or you start having new symptoms.  You feel overwhelmed by your problems and can no longer manage them on your own. SEEK IMMEDIATE MEDICAL CARE IF:  You feel like hurting yourself or someone else.   This information is not intended to replace advice given to you by your health care provider. Make sure you discuss any questions you have with your health care provider.   Document Released: 10/20/2000 Document Revised: 05/17/2014 Document Reviewed: 12/19/2012 Elsevier Interactive Patient Education [employer].  Emergency Department Resource Guide 1) Find a Doctor and Pay Out of Pocket Although you won't have to find out who is covered by your insurance plan, it is a good idea to ask around and get recommendations. You will then need to call the office and see if the doctor you have chosen will accept you as a new patient and what types of options they offer for patients who are self-pay. Some doctors offer discounts or will set up payment plans for their patients who do not have insurance, but you will need to ask so you aren't surprised when you get to your appointment.  2) Contact Your Local Health  Department Not all health departments have doctors that can see patients for sick visits, but many do, so it is worth a call to see if yours does. If you don't know where your local health department is, you can check in your phone book. The CDC also has a tool to help you locate your state's health department, and many state websites also have listings of all of their local health departments.  3) Find a [facility] If your illness is not likely to be very severe or complicated, you may want to try a walk in clinic. These are popping up all over the country in pharmacies, drugstores, and shopping centers. They're usually staffed by nurse practitioners or physician assistants that have been trained to treat common illnesses and complaints. They're usually fairly quick and inexpensive. However, if you have serious medical issues or chronic medical problems, these are probably not your best option.  No Primary Care Doctor: - Call Health Connect at  [phone] - they can help you locate a primary care doctor that  accepts your insurance, provides certain services, etc. - Physician Referral Service- [phone]  Chronic Pain Problems: Organization         Address  Phone   Notes  [facility]  [phone] Patients need to be referred by their primary care doctor.   Medication Assistance: Organization  Address  Phone   Notes  [facility] Medication [facility] [address] [place], [place] [address] [phone] --Must be a resident of [facility] -- Must have NO insurance coverage whatsoever (no Medicaid/ Medicare, etc.) -- The pt. MUST have a primary care doctor that directs their care regularly and follows them in the community   Med[facility]ist  [phone]   [employer]  [phone]    Agencies that provide inexpensive medical care: Organization         Address  Phone   Notes  [place]  [phone]   [facility]    [phone]   [facility] [place], [place] [address] [phone]   [place] [address] [place] [phone]   Planned Parenthood    [phone]   [facility]    [phone]   [place] and [place] [address], [place] Phone:  [phone], Fax:  [phone] Hours of Operation:  9 am - 6 pm, M-F.  Also accepts Medicaid/Medicare and self-pay.  [facility] for [place], [address], [place] Phone: [phone], Fax: [phone]. Hours of Operation:  8:30 am - 5:30 pm, M-F.  Also accepts Medicaid and self-pay.  [facility] [place] [address] [place] Phone: [phone]   [place], [place], [place] [phone], Ext. [phone] Mondays & Thursdays: 7-9 AM.  First 15 patients are seen on a first come, first serve basis.    [place] Providers:  Organization         Address  Phone   Notes  [facility] [address], [place] [phone] Also accepts self-pay patients.  [facility] [address], [place]  [phone]   [place], [address], [place] [phone]   [facility] [address] [place] [phone]   Lucianne Lei [address], [place]   [phone] Only accepts [place] Access [place] patients after they have their name applied to their card.   Self-Pay (no insurance) in [facility]:  Organization         Address  Phone   Notes  Sickle Cell Patients, [facility] [place] [phone]   [facility] [place] [phone]   [facility] [place]  [place], [place],  [place] [phone]   [facility]/Dr. Osei-Bonsu  [address] [place] [address], [place] [address], [place] [phone] Phone number for both [place] and [place] locations is the same.  [facility] [address], [place] [phone]   [facility] [address] [place] or [address] [phone]   [facility] [address] [place] [phone], phone; [phone], fax Sees patients 1st and 3rd Saturday of every month.  Must not qualify for public or private insurance (i.e. Medicaid, Medicare, [place] Health Choice, Veterans' Benefits)  Household income should be no more than 200% of the poverty level The clinic cannot treat you if you are pregnant or think you  are pregnant  Sexually transmitted diseases are not treated at the clinic.    Dental Care: Organization         Address  Phone  Notes  [facility] [place] [phone] Accepts children up to age 78 who are enrolled in [place] or [place]; pregnant women with a Medicaid card; and children who have applied for Medicaid or [place] Health Choice, but were declined, whose parents can pay a reduced fee at time of service.  [facility] Department of [facility]  [address], [place] [phone] Accepts children up to age 62 who are enrolled in [place] or [place]; pregnant women with a Medicaid card; and children who have applied for Medicaid or [place] Health Choice, but were declined, whose parents can pay a reduced fee at time of service.  [place] Adult Dental Access PROGRAM  [place] [phone] Patients are seen by appointment only. Walk-ins are not accepted. [place] will see patients 69 years of age and older. Monday - Tuesday (8am-5pm) Most Wednesdays  (8:30-5pm) $30 per visit, cash only  [facility] Adult Dental Access PROGRAM  [address], [facility] [phone] Patients are seen by appointment only. Walk-ins are not accepted. [place] will see patients 16 years of age and older. One Wednesday Evening (Monthly: Volunteer Based).  $30 per visit, cash only  [place]  [phone] for adults; Children under age 11, call Graduate Pediatric Dentistry at [phone]. Children aged 47-14, please call [phone] to request a pediatric application.  Dental services are provided in all areas of dental care including fillings, crowns and bridges, complete and partial dentures, implants, gum treatment, root canals, and extractions. Preventive care is also provided. Treatment is provided to both adults and children. Patients are selected via a lottery and there is often a waiting list.   [facility] [address] [place]  [phone] [email or web address]   [facility] [address] [place], [place] [phone], Ext. [phone] Second and Fourth Thursday of each month, opens at 6:30 AM; Clinic ends at 9 AM.  Patients are seen on a first-come first-served basis, and a limited number are seen during each clinic.   [facility]  [address] [place], [place] [phone]   Eligibility Requirements You must have lived in [place], [place], or [place] counties for at least the last three months.   You cannot be eligible for state or federal sponsored [employer], including [employer], [place], or [employer].   You generally cannot be eligible for healthcare insurance through your employer.    How to apply: Eligibility screenings are held every Tuesday and Wednesday afternoon from 1:00 pm until 4:00 pm. You do not need an appointment for the interview!  [facility] [address] [place], [place]   [place]  [place]  [place]  [phone]    Behavioral Health Resources in the Community: Intensive Outpatient Programs Organization         Address  Phone  Notes  [place]. [address], [place], [place] [phone]   [facility] [address] [place], [place]   ADS: [facility] [address]  Dr, [place], [place] ° [phone]   °[facility] [address],  °[place], [place] 1-800-853-5163 or 336-641-4981   °Substance Abuse Resources °Organization         Address  Phone  Notes  °[facility]  336-882-2125   °[facility]  336-784-9470   °[facility]  336-285-9073   °[facility]  336-845-3988   °Residential & Outpatient Substance Abuse Program  1-800-659-3381   °Psychological Services °Organization         Address  Phone  Notes  °[facility]  336- [phone]   °[facility]  336- 378-7881   °[facility] [address], [place] 1-800-853-5163 or 336-641-4981   ° °Mobile Crisis Teams °Organization         Address  Phone  Notes  °[facility], Mobile Crisis Care Unit  1-877-626-1772   °Assertive °Psychotherapeutic Services ° [address] [place], [place] 336-834-9664   °Sharon DeEsch [address] °[place] [phone]   ° °Self-Help/Support Groups °Organization         Address  Phone             Notes  °[facility] [place] - variety of support groups  336- 373-1402 Call for more information  °Narcotics Anonymous (NA), [facility] [address], °[place]  2 meetings at this location  ° °Residential Treatment Programs °Organization         Address  Phone  Notes  °[facility] [address],    °[place]  1-866-801-8205   °[facility] ° [address], [place], [place]  [phone]   °[facility] [address], [place] [phone] Admissions: 8am-3pm M-F  °[facility] [address]    °[place], [place] 336-841-1104   °[facility] [address], [place], [place] [phone]   °[facility] [address]  °[place], [place] 336-285-9073   °[facility] - Intensive Outpatient [address], , [place] 336-852-3033   °[facility] ([facility] [address]  °[place], [place] 1-877-615-2722 or 336-784-9470   °[facility] ([facility]) [address] [place], [place] 336-227-7417 Accepts Medicaid  °[facility] [address]  ° [place] 1-800-659-3381 Substance Abuse/Addiction Treatment  ° °[place] Behavioral Health Resources °Organization         Address  Phone  Notes  °[facility]  (888) 581-9988   °Julie Brannon, PhD [address] [place], [place]   (336) 349-5553 or (336) 951-0000   ° Behavioral   [address] °[place], [place] (336) 349-4454   °[facility] [address], [place], [place] (336) 342-8316 Insurance/Medicaid/sponsorship through [facility]  °[facility] [address]                                    [place], [place] (336) 342-8316 Therapy/tele-psych/case  °[facility] [address]  ° [place], [place] (336) 349-2233    °Dr. Arfeen  (336) 349-4544   °[facility] [place]  [employer] [place] [facility] 1) [address], [place] °2) [address], [place] °3)  [address], [place] (336) 349-3220 °(336) 342-7768 ° °(336) 342-8140   °[place] Child Abuse Hotline (336)   146-0479 or 423-185-6900 (After Hours)

## 2015-06-30 NOTE — ED Notes (Signed)
[place] aware of pt complaint; reports do not place standing orders at present time.

## 2015-06-30 NOTE — ED Provider Notes (Signed)
Checked room three times at this point and no patient in room. Presume he eloped. Nursing notified.   Virgel Manifold, MD 06/30/15 [phone]

## 2015-07-16 ENCOUNTER — Emergency Department (HOSPITAL_COMMUNITY)
Admission: EM | Admit: 2015-07-16 | Discharge: 2015-07-16 | Disposition: A | Discharge status: Home / Self Care | Payer: Medicaid Other | Attending: Emergency Medicine | Admitting: Emergency Medicine

## 2015-07-16 ENCOUNTER — Encounter (HOSPITAL_COMMUNITY): Payer: Self-pay | Admitting: *Deleted

## 2015-07-16 DIAGNOSIS — F17210 Nicotine dependence, cigarettes, uncomplicated: Secondary | ICD-10-CM | POA: Insufficient documentation

## 2015-07-16 DIAGNOSIS — G4700 Insomnia, unspecified: Secondary | ICD-10-CM | POA: Insufficient documentation

## 2015-07-16 DIAGNOSIS — Z79899 Other long term (current) drug therapy: Secondary | ICD-10-CM | POA: Insufficient documentation

## 2015-07-16 DIAGNOSIS — J40 Bronchitis, not specified as acute or chronic: Secondary | ICD-10-CM | POA: Diagnosis not present

## 2015-07-16 DIAGNOSIS — R05 Cough: Secondary | ICD-10-CM | POA: Diagnosis present

## 2015-07-16 DIAGNOSIS — J441 Chronic obstructive pulmonary disease with (acute) exacerbation: Secondary | ICD-10-CM | POA: Insufficient documentation

## 2015-07-16 DIAGNOSIS — F319 Bipolar disorder, unspecified: Secondary | ICD-10-CM | POA: Insufficient documentation

## 2015-07-16 DIAGNOSIS — K219 Gastro-esophageal reflux disease without esophagitis: Secondary | ICD-10-CM | POA: Insufficient documentation

## 2015-07-16 DIAGNOSIS — E039 Hypothyroidism, unspecified: Secondary | ICD-10-CM | POA: Diagnosis not present

## 2015-07-16 DIAGNOSIS — F419 Anxiety disorder, unspecified: Secondary | ICD-10-CM | POA: Diagnosis not present

## 2015-07-16 DIAGNOSIS — E785 Hyperlipidemia, unspecified: Secondary | ICD-10-CM | POA: Insufficient documentation

## 2015-07-16 DIAGNOSIS — Z8739 Personal history of other diseases of the musculoskeletal system and connective tissue: Secondary | ICD-10-CM | POA: Diagnosis not present

## 2015-07-16 DIAGNOSIS — Z7951 Long term (current) use of inhaled steroids: Secondary | ICD-10-CM | POA: Insufficient documentation

## 2015-07-16 MED ORDER — ALBUTEROL SULFATE (2.5 MG/3ML) 0.083% IN NEBU
2.5000 mg | INHALATION_SOLUTION | Freq: Once | RESPIRATORY_TRACT | Status: AC
  Administered 2015-07-16: 2.5 mg via RESPIRATORY_TRACT
  Filled 2015-07-16: qty 3

## 2015-07-16 MED ORDER — DEXAMETHASONE 4 MG PO TABS: 12.0000 mg | ORAL_TABLET | Freq: Once | ORAL | Status: DC

## 2015-07-16 MED ORDER — ALBUTEROL SULFATE HFA 108 (90 BASE) MCG/ACT IN AERS: 1.0000 | INHALATION_SPRAY | RESPIRATORY_TRACT | Status: DC | PRN

## 2015-07-16 MED ORDER — DEXAMETHASONE 4 MG PO TABS
12.0000 mg | ORAL_TABLET | Freq: Once | ORAL | Status: AC
  Administered 2015-07-16: 12 mg via ORAL
  Filled 2015-07-16: qty 3

## 2015-07-16 NOTE — ED Provider Notes (Signed)
CSN: [id number]     Arrival date & time 07/16/15  0736 History   First MD Initiated Contact with Patient 07/16/15 [phone]     Chief Complaint  Patient presents with  . Cough     (Consider location/radiation/quality/duration/timing/severity/associated sxs/prior Treatment) HPI   60 year old male with cough. Worsening over the last several weeks.Patient reports that a section but smoking more in the past day.He uses inhaler multiple times with temporary relief. Cough is nonproductive. Denies any chest pain. Has felt like he has been wheezing. No unusual leg pain or swelling. No fevers or chills.  Past Medical History  Diagnosis Date  . SOB (shortness of breath)   . Dizziness   . Anxiety   . Arthritis   . Asthma   . COPD (chronic obstructive pulmonary disease) ([place])   . Hypothyroidism   . Bipolar 1 disorder ([place])   . Chronic insomnia   . Depression   . Diaphragmatic hernia   . ED (erectile dysfunction)   . GERD (gastroesophageal reflux disease)   . HLD (hyperlipidemia)   . Umbilical hernia without obstruction and without gangrene   . Tobacco use    Past Surgical History  Procedure Laterality Date  . Colonoscopy    . Upper gi endoscopy    . Umbilical hernia repair N/A 01/01/2013    Procedure: HERNIA REPAIR UMBILICAL ADULT;  Surgeon: Merrie Roof, MD;  Location: [facility];  Service: General;  Laterality: N/A;  . Insertion of mesh N/A 01/01/2013    Procedure: INSERTION OF MESH;  Surgeon: Merrie Roof, MD;  Location: [facility];  Service: General;  Laterality: N/A;   Family History  Problem Relation Age of Onset  . Arrhythmia    . ALS    . Prostate cancer Maternal Grandfather   . Colon cancer Paternal Grandfather   . Heart disease Mother    Social History  Substance Use Topics  . Smoking status: Current Every Day Smoker -- 2.00 packs/day for 40 years    Types: Cigarettes  . Smokeless tobacco: Never Used  . Alcohol Use: Yes     Comment: "Never, hardly one at all"    Review of  Systems  All systems reviewed and negative, other than as noted in HPI.   Allergies  Codeine; Lipitor; Lithium carbonate; Pollen extract; and [place]  Home Medications   Prior to Admission medications   Medication Sig Start Date End Date Taking? Authorizing Provider  albuterol (PROVENTIL HFA;VENTOLIN HFA) 108 (90 BASE) MCG/ACT inhaler Inhale 1-2 puffs into the lungs 4 (four) times daily as needed for wheezing or shortness of breath (wheezing).     Historical Provider, MD  albuterol (PROVENTIL HFA;VENTOLIN HFA) 108 (90 Base) MCG/ACT inhaler Inhale 1-2 puffs into the lungs every 4 (four) hours as needed for wheezing or shortness of breath. 07/16/15   Virgel Manifold, MD  clonazePAM (KLONOPIN) 1 MG tablet Take 1 mg by mouth 3 (three) times daily as needed for anxiety.  02/24/15   Historical Provider, MD  dexamethasone (DECADRON) 4 MG tablet Take 3 tablets (12 mg total) by mouth once. 12mg  PO on 07/17/15. 07/16/15   Virgel Manifold, MD  divalproex (DEPAKOTE) 250 MG DR tablet Take 750 mg by mouth at bedtime. 06/27/15   Historical Provider, MD  esomeprazole (NEXIUM) 40 MG capsule Take 40 mg by mouth daily at 12 noon.    Historical Provider, MD  fluticasone (FLONASE) 50 MCG/ACT nasal spray Place 2 sprays into both nostrils daily. 02/03/15   Historical  Provider, MD  HYDROcodone-acetaminophen (NORCO/VICODIN) 5-325 MG tablet Take 1 tablet by mouth every 6 (six) hours as needed for severe pain. Patient not taking: Reported on 06/30/2015 03/10/15   Carmin Muskrat, MD  levothyroxine (SYNTHROID, LEVOTHROID) 150 MCG tablet Take 150 mcg by mouth every morning.     Historical Provider, MD  meloxicam (MOBIC) 15 MG tablet Take 15 mg by mouth daily. 06/03/15   Historical Provider, MD  oxyCODONE (OXY IR/ROXICODONE) 5 MG immediate release tablet Take 1 tablet (5 mg total) by mouth every 6 (six) hours as needed for breakthrough pain. Patient not taking: Reported on 06/30/2015 03/11/15   Leo Grosser, MD  risperiDONE (RISPERDAL) 2  MG tablet Take 2 mg by mouth at bedtime.     Historical Provider, MD   BP 108/63 mmHg  Pulse 78  Temp(Src) 97.8 F (36.6 C) (Oral)  Resp 20  SpO2 97% Physical Exam  Constitutional: He appears well-developed and well-nourished. No distress.  HENT:  Head: Normocephalic and atraumatic.  Eyes: Conjunctivae are normal. Right eye exhibits no discharge. Left eye exhibits no discharge.  Neck: Neck supple.  Cardiovascular: Normal rate, regular rhythm and normal heart sounds.  Exam reveals no gallop and no friction rub.   No murmur heard. Pulmonary/Chest: Effort normal. No respiratory distress. He has wheezes.  Abdominal: Soft. He exhibits no distension. There is no tenderness.  Musculoskeletal: He exhibits no edema or tenderness.  Lower extremities symmetric as compared to each other. No calf tenderness. Negative Homan's. No palpable cords.   Neurological: He is alert.  Skin: Skin is warm and dry.  Psychiatric: He has a normal mood and affect. His behavior is normal. Thought content normal.  Nursing note and vitals reviewed.   ED Course  Procedures (including critical care time) Labs Review Labs Reviewed - No data to display  Imaging Review No results found. I have personally reviewed and evaluated these images and lab results as part of my medical decision-making.   EKG Interpretation None      MDM   Final diagnoses:  Bronchitis    59yM with cough/wheezing. Only mild wheezing on exam. No increased WOB. o2 sats normal on RA. Mild COPD exacerbation versus viral bronchitis or other respiratory illness. Recent increase in smoking likely nor helping. Given a neb in ED. A couple days of decadron. Afebrile. Nonproductive cough.Pt refusing any testing. I think this is reasonable. No respiratory distress. I do not feel imaging is necessary at this time. Doubt PE, atypical ACS or other emergent process. Encouraged to cut back or quit smoking. Return precautions discussed. Outpt FU  otherwise.     Virgel Manifold, MD 07/20/15 2222

## 2015-07-16 NOTE — ED Notes (Signed)
Bed: WA13 Expected date:  Expected time:  Means of arrival:  Comments: EMS-SOB 

## 2015-07-16 NOTE — ED Notes (Signed)
Pt refuses to get undressed into gown, and would not like any x-rays

## 2015-07-16 NOTE — ED Notes (Signed)
MD at bedside. 

## 2015-07-16 NOTE — ED Notes (Signed)
Per EMS, pt from home, reports he has been coughing and has been going on for a long time so he's been smoking more today.  Has used his inhaler 8 times this am.  EMS reports pt is ambulatory without difficulty.  Is in NAD.  Appears to be breathing normal.

## 2015-12-09 ENCOUNTER — Encounter: Payer: Medicaid Other | Admitting: Neurology

## 2015-12-09 ENCOUNTER — Telehealth: Payer: Self-pay

## 2015-12-09 NOTE — Telephone Encounter (Signed)
Pt called to cancel EMG on the same day as scheduled.

## 2015-12-10 ENCOUNTER — Encounter: Payer: Self-pay | Admitting: Neurology

## 2015-12-18 ENCOUNTER — Emergency Department (HOSPITAL_COMMUNITY)
Admission: EM | Admit: 2015-12-18 | Discharge: 2015-12-18 | Discharge status: Against Medical Advice | Payer: No Typology Code available for payment source

## 2015-12-18 ENCOUNTER — Emergency Department (HOSPITAL_COMMUNITY): Payer: No Typology Code available for payment source

## 2015-12-18 ENCOUNTER — Ambulatory Visit (HOSPITAL_COMMUNITY): Admission: RE | Admit: 2015-12-18 | Payer: No Typology Code available for payment source | Source: Ambulatory Visit

## 2015-12-18 MED ORDER — ALBUTEROL SULFATE (2.5 MG/3ML) 0.083% IN NEBU
5.0000 mg | INHALATION_SOLUTION | Freq: Once | RESPIRATORY_TRACT | Status: DC
  Filled 2015-12-18: qty 6

## 2015-12-18 NOTE — ED Notes (Signed)
Bed: WLPT1 Expected date:  Expected time:  Means of arrival:  Comments: EMS 

## 2015-12-18 NOTE — ED Notes (Signed)
Upon going to triage pt; pt came out of room and went out to the lobby and went outside; pt was walking briskly and had no signs of shortness of breath upon leaving

## 2016-02-04 ENCOUNTER — Ambulatory Visit (INDEPENDENT_AMBULATORY_CARE_PROVIDER_SITE_OTHER): Payer: Self-pay | Admitting: Neurology

## 2016-02-04 ENCOUNTER — Ambulatory Visit (INDEPENDENT_AMBULATORY_CARE_PROVIDER_SITE_OTHER): Payer: Medicaid Other | Admitting: Neurology

## 2016-02-04 ENCOUNTER — Encounter: Payer: Self-pay | Admitting: Neurology

## 2016-02-04 DIAGNOSIS — G5601 Carpal tunnel syndrome, right upper limb: Secondary | ICD-10-CM

## 2016-02-04 HISTORY — DX: Carpal tunnel syndrome, right upper limb: G56.01
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
## 2016-02-04 NOTE — Progress Notes (Signed)
Please refer to EMG and nerve conduction study procedure note. 

## 2016-07-28 ENCOUNTER — Emergency Department (HOSPITAL_COMMUNITY)
Admission: EM | Admit: 2016-07-28 | Discharge: 2016-07-28 | Disposition: A | Discharge status: Home / Self Care | Payer: Medicaid Other | Attending: Emergency Medicine | Admitting: Emergency Medicine

## 2016-07-28 DIAGNOSIS — K0889 Other specified disorders of teeth and supporting structures: Secondary | ICD-10-CM | POA: Diagnosis not present

## 2016-07-28 DIAGNOSIS — Z79899 Other long term (current) drug therapy: Secondary | ICD-10-CM | POA: Insufficient documentation

## 2016-07-28 DIAGNOSIS — J449 Chronic obstructive pulmonary disease, unspecified: Secondary | ICD-10-CM | POA: Insufficient documentation

## 2016-07-28 DIAGNOSIS — E039 Hypothyroidism, unspecified: Secondary | ICD-10-CM | POA: Diagnosis not present

## 2016-07-28 DIAGNOSIS — F17210 Nicotine dependence, cigarettes, uncomplicated: Secondary | ICD-10-CM | POA: Diagnosis not present

## 2016-07-28 MED ORDER — OXYCODONE HCL 5 MG PO TABS
10.0000 mg | ORAL_TABLET | Freq: Once | ORAL | Status: AC
  Administered 2016-07-28: 10 mg via ORAL
  Filled 2016-07-28: qty 2

## 2016-07-28 NOTE — ED Provider Notes (Signed)
[facility] DEPT Provider Note   CSN: [id number] Arrival date & time: 07/28/16  2024  By signing my name below, I, Reola Mosher, attest that this documentation has been prepared under the direction and in the presence of Domenic Moras, PA-C.  Electronically Signed: Reola Mosher, ED Scribe. 07/28/16. 9:05 PM.  History   Chief Complaint Chief Complaint  Patient presents with  . Dental Pain   The history is provided by the patient. No language interpreter was used.    HPI Comments: [name] [name] is a 61 y.o. male who is ~1 week s/p dental extraction, who presents to the Emergency Department complaining of persistent diffuse dental pain beginning one week ago, worsening over the past day.  Per pt, one week ago he underwent oral surgery to have his bones "grinded down" for future denture placement. He was prescribed Oxycodone following surgery which was filled on 03/14 with a quantity of 18 tablets which he has finished. He is now requesting a refill of this for pain control. He is also currently on a course of antibiotics which he has been compliant with. Pt's pain is exacerbated with chewing. Per pt, he called his oral surgeon prior to coming into the ED who advised him to come in. Pt denies facial swelling, fever, chills, or any other associated symptoms.   Past Medical History:  Diagnosis Date  . Anxiety   . Arthritis   . Asthma   . Bipolar 1 disorder ([place])   . Carpal tunnel syndrome on right 02/04/2016  . Chronic insomnia   . COPD (chronic obstructive pulmonary disease) ([place])   . Depression   . Diaphragmatic hernia   . Dizziness   . ED (erectile dysfunction)   . GERD (gastroesophageal reflux disease)   . HLD (hyperlipidemia)   . Hypothyroidism   . SOB (shortness of breath)   . Tobacco use   . Umbilical hernia without obstruction and without gangrene    Patient Active Problem List   Diagnosis Date Noted  . Carpal tunnel syndrome on right 02/04/2016  .  Umbilical hernia 25/09/3974  . Chest pain 03/31/2012  . GERD (gastroesophageal reflux disease) 03/06/2012  . Hypothyroidism 03/06/2012  . COPD (chronic obstructive pulmonary disease) ([place]) 03/06/2012   Past Surgical History:  Procedure Laterality Date  . COLONOSCOPY    . INSERTION OF MESH N/A 01/01/2013   Procedure: INSERTION OF MESH;  Surgeon: Merrie Roof, MD;  Location: [facility];  Service: General;  Laterality: N/A;  . UMBILICAL HERNIA REPAIR N/A 01/01/2013   Procedure: HERNIA REPAIR UMBILICAL ADULT;  Surgeon: Merrie Roof, MD;  Location: [facility];  Service: General;  Laterality: N/A;  . UPPER GI ENDOSCOPY      Home Medications    Prior to Admission medications   Medication Sig Start Date End Date Taking? Authorizing Provider  albuterol (PROVENTIL HFA;VENTOLIN HFA) 108 (90 BASE) MCG/ACT inhaler Inhale 1-2 puffs into the lungs 4 (four) times daily as needed for wheezing or shortness of breath (wheezing).     Historical Provider, MD  albuterol (PROVENTIL HFA;VENTOLIN HFA) 108 (90 Base) MCG/ACT inhaler Inhale 1-2 puffs into the lungs every 4 (four) hours as needed for wheezing or shortness of breath. 07/16/15   Virgel Manifold, MD  clonazePAM (KLONOPIN) 1 MG tablet Take 1 mg by mouth 3 (three) times daily as needed for anxiety.  02/24/15   Historical Provider, MD  dexamethasone (DECADRON) 4 MG tablet Take 3 tablets (12 mg total) by mouth once.  12mg  PO on 07/17/15. 07/16/15   Virgel Manifold, MD  divalproex (DEPAKOTE) 250 MG DR tablet Take 750 mg by mouth at bedtime. 06/27/15   Historical Provider, MD  esomeprazole (NEXIUM) 40 MG capsule Take 40 mg by mouth daily at 12 noon.    Historical Provider, MD  fluticasone (FLONASE) 50 MCG/ACT nasal spray Place 2 sprays into both nostrils daily. 02/03/15   Historical Provider, MD  HYDROcodone-acetaminophen (NORCO/VICODIN) 5-325 MG tablet Take 1 tablet by mouth every 6 (six) hours as needed for severe pain. Patient not taking: Reported on 06/30/2015 03/10/15    Carmin Muskrat, MD  levothyroxine (SYNTHROID, LEVOTHROID) 150 MCG tablet Take 150 mcg by mouth every morning.     Historical Provider, MD  meloxicam (MOBIC) 15 MG tablet Take 15 mg by mouth daily. 06/03/15   Historical Provider, MD  oxyCODONE (OXY IR/ROXICODONE) 5 MG immediate release tablet Take 1 tablet (5 mg total) by mouth every 6 (six) hours as needed for breakthrough pain. Patient not taking: Reported on 06/30/2015 03/11/15   Leo Grosser, MD  risperiDONE (RISPERDAL) 2 MG tablet Take 2 mg by mouth at bedtime.     Historical Provider, MD   Family History Family History  Problem Relation Age of Onset  . Arrhythmia    . ALS    . Prostate cancer Maternal Grandfather   . Colon cancer Paternal Grandfather   . Heart disease Mother    Social History Social History  Substance Use Topics  . Smoking status: Current Every Day Smoker    Packs/day: 2.00    Years: 40.00    Types: Cigarettes  . Smokeless tobacco: Never Used  . Alcohol use Yes     Comment: "Never, hardly one at all"   Allergies   Codeine; Lipitor [atorvastatin]; Lithium carbonate [lithium]; Pollen extract; and Talwin [pentazocine]  Review of Systems Review of Systems  Constitutional: Negative for chills and fever.  HENT: Positive for dental problem. Negative for facial swelling.   Skin: Positive for wound (dental surgery).   Physical Exam Updated Vital Signs BP (!) 147/95 (BP Location: Right Arm)   Pulse 77   Temp 97.7 F (36.5 C) (Oral)   SpO2 95%   Physical Exam  Constitutional: He appears well-developed and well-nourished. No distress.  HENT:  Head: Normocephalic and atraumatic.  Stitches are in place on the right upper gum without any signs of infection.   Eyes: Conjunctivae are normal.  Neck: Normal range of motion.  Cardiovascular: Normal rate.   Pulmonary/Chest: Effort normal.  Abdominal: He exhibits no distension.  Musculoskeletal: Normal range of motion.  Neurological: He is alert.  Skin: No  pallor.  Psychiatric: He has a normal mood and affect. His behavior is normal.  Nursing note and vitals reviewed.  ED Treatments / Results  DIAGNOSTIC STUDIES: Oxygen Saturation is 95% on RA, adequate by my interpretation.   COORDINATION OF CARE: 9:05 PM-Discussed next steps with pt. Pt verbalized understanding and is agreeable with the plan.   Labs (all labs ordered are listed, but only abnormal results are displayed) Labs Reviewed - No data to display  EKG  EKG Interpretation None      Radiology No results found.  Procedures Procedures   Medications Ordered in ED Medications - No data to display  Initial Impression / Assessment and Plan / ED Course  I have reviewed the triage vital signs and the nursing notes.  Pertinent labs & imaging results that were available during my care of the patient were reviewed  by me and considered in my medical decision making (see chart for details).    BP (!) 147/95 (BP Location: Right Arm)   Pulse 77   Temp 97.7 F (36.5 C) (Oral)   SpO2 95%    Final Clinical Impressions(s) / ED Diagnoses   Final diagnoses:  Pain, dental   New Prescriptions New Prescriptions   No medications on file   I personally performed the services described in this documentation, which was scribed in my presence. The recorded information has been reviewed and is accurate.   Pt report pain to R upper gum after dental surgery a week ago. No evidence of infection on exam. Pt was given pain medication but has ran out. I did provide pain medication while pt in the ER but encourage pt to f/u with his oral surgeon for further care.     Domenic Moras, PA-C 07/29/16 [place], MD 07/29/16 7035

## 2016-07-28 NOTE — Discharge Instructions (Signed)
Please follow up closely with your oral surgeon for further management of your pain

## 2016-07-28 NOTE — ED Triage Notes (Addendum)
Pt states that he had dental surgery 1 week ago and is requesting pain management. Took oxycodone at home but ran out. Alert and oriented.

## 2016-07-28 NOTE — ED Notes (Signed)
Patient was alert, oriented and stable upon discharge. RN went over AVS and patient had no further questions.  Patient was instructed not to drive or operate heavy machinery on narcotic pain medication.   

## 2016-07-29 ENCOUNTER — Encounter (HOSPITAL_COMMUNITY): Payer: Self-pay | Admitting: *Deleted

## 2016-07-29 ENCOUNTER — Emergency Department (HOSPITAL_COMMUNITY)
Admission: EM | Admit: 2016-07-29 | Discharge: 2016-07-29 | Disposition: A | Discharge status: Home / Self Care | Payer: Medicaid Other | Attending: Emergency Medicine | Admitting: Emergency Medicine

## 2016-07-29 DIAGNOSIS — R45851 Suicidal ideations: Secondary | ICD-10-CM | POA: Diagnosis present

## 2016-07-29 DIAGNOSIS — F1910 Other psychoactive substance abuse, uncomplicated: Secondary | ICD-10-CM | POA: Diagnosis not present

## 2016-07-29 DIAGNOSIS — J449 Chronic obstructive pulmonary disease, unspecified: Secondary | ICD-10-CM | POA: Diagnosis not present

## 2016-07-29 DIAGNOSIS — Z79899 Other long term (current) drug therapy: Secondary | ICD-10-CM | POA: Insufficient documentation

## 2016-07-29 DIAGNOSIS — F17210 Nicotine dependence, cigarettes, uncomplicated: Secondary | ICD-10-CM | POA: Diagnosis not present

## 2016-07-29 DIAGNOSIS — E039 Hypothyroidism, unspecified: Secondary | ICD-10-CM | POA: Diagnosis not present

## 2016-07-29 LAB — CBC
HCT: 40.1 % (ref 39.0–52.0)
Hemoglobin: 13.7 g/dL (ref 13.0–17.0)
MCH: 29.8 pg (ref 26.0–34.0)
MCHC: 34.2 g/dL (ref 30.0–36.0)
MCV: 87.2 fL (ref 78.0–100.0)
PLATELETS: 242 10*3/uL (ref 150–400)
RBC: 4.6 MIL/uL (ref 4.22–5.81)
RDW: 14.2 % (ref 11.5–15.5)
WBC: 10.5 10*3/uL (ref 4.0–10.5)

## 2016-07-29 LAB — ETHANOL: ALCOHOL ETHYL (B): 56 mg/dL — AB (ref <5)

## 2016-07-29 LAB — COMPREHENSIVE METABOLIC PANEL
ALK PHOS: 42 U/L (ref 38–126)
ALT: 19 U/L (ref 17–63)
AST: 18 U/L (ref 15–41)
Albumin: 4 g/dL (ref 3.5–5.0)
Anion gap: 8 (ref 5–15)
BILIRUBIN TOTAL: 0.4 mg/dL (ref 0.3–1.2)
BUN: 14 mg/dL (ref 6–20)
CALCIUM: 9.1 mg/dL (ref 8.9–10.3)
CO2: 23 mmol/L (ref 22–32)
CREATININE: 1.33 mg/dL — AB (ref 0.61–1.24)
Chloride: 106 mmol/L (ref 101–111)
GFR, EST NON AFRICAN AMERICAN: 57 mL/min — AB (ref >60)
Glucose, Bld: 81 mg/dL (ref 65–99)
Potassium: 3.5 mmol/L (ref 3.5–5.1)
Sodium: 137 mmol/L (ref 135–145)
Total Protein: 6.4 g/dL — ABNORMAL LOW (ref 6.5–8.1)

## 2016-07-29 LAB — ACETAMINOPHEN LEVEL: Acetaminophen (Tylenol), Serum: <10 ug/mL — ABNORMAL LOW (ref 10–30)

## 2016-07-29 LAB — SALICYLATE LEVEL

## 2016-07-29 NOTE — ED Notes (Signed)
Bed: FG90 Expected date:  Expected time:  Means of arrival:  Comments: SI from triage

## 2016-07-29 NOTE — ED Provider Notes (Signed)
61 yo M with a chief complaint of suicidal ideation. Patient came in last night and was intoxicated. He states that he was looking for a place to sleep and now he has gotten some sleep you like to go home and do some more sleeping. He denies suicidal or homicidal ideation. Is able to state to me that he has no plan to hurt himself. He does have things look forward to home. I'll discharge him home at this time.   Deno Etienne, DO 07/29/16 [phone]

## 2016-07-29 NOTE — ED Notes (Signed)
Patient became angry when nurse asked him for a urine sample.  Patient said he wanted to leave.  Dr. Tyrone Nine notified.  Patient says he is tired or being awakened.to give urine samples.  Dr. Tyrone Nine at bedside.  Patient denies suicidal or homicidal ideation.  MD to discharge patient.

## 2016-07-29 NOTE — ED Provider Notes (Signed)
[facility] DEPT Provider Note   CSN: [id number] Arrival date & time: 07/29/16  0415     History   Chief Complaint Chief Complaint  Patient presents with  . Suicidal    HPI [name] [name] is a 60 y.o. male.  HPI  61 year old male presents with SI. States he has felt this way since being discharged last night from this ED. Blames interactions with his mother in law. States he feels quite tired and hasn't slept well recently. Was here for post op dental pain last night and states he was given a "placebo" pill instead of oxycodone. Denies ETOH or drug use. History is limited and patient often gets agitated during certain questions. Does not specify a plan to kill himself.   Past Medical History:  Diagnosis Date  . Anxiety   . Arthritis   . Asthma   . Bipolar 1 disorder ([place])   . Carpal tunnel syndrome on right 02/04/2016  . Chronic insomnia   . COPD (chronic obstructive pulmonary disease) ([place])   . Depression   . Diaphragmatic hernia   . Dizziness   . ED (erectile dysfunction)   . GERD (gastroesophageal reflux disease)   . HLD (hyperlipidemia)   . Hypothyroidism   . SOB (shortness of breath)   . Tobacco use   . Umbilical hernia without obstruction and without gangrene     Patient Active Problem List   Diagnosis Date Noted  . Carpal tunnel syndrome on right 02/04/2016  . Umbilical hernia 36/46/8032  . Chest pain 03/31/2012  . GERD (gastroesophageal reflux disease) 03/06/2012  . Hypothyroidism 03/06/2012  . COPD (chronic obstructive pulmonary disease) ([place]) 03/06/2012    Past Surgical History:  Procedure Laterality Date  . COLONOSCOPY    . INSERTION OF MESH N/A 01/01/2013   Procedure: INSERTION OF MESH;  Surgeon: Merrie Roof, MD;  Location: [facility];  Service: General;  Laterality: N/A;  . UMBILICAL HERNIA REPAIR N/A 01/01/2013   Procedure: HERNIA REPAIR UMBILICAL ADULT;  Surgeon: Merrie Roof, MD;  Location: [facility];  Service: General;  Laterality: N/A;  .  UPPER GI ENDOSCOPY         Home Medications    Prior to Admission medications   Medication Sig Start Date End Date Taking? Authorizing Provider  albuterol (PROVENTIL HFA;VENTOLIN HFA) 108 (90 BASE) MCG/ACT inhaler Inhale 1-2 puffs into the lungs 4 (four) times daily as needed for wheezing or shortness of breath (wheezing).     Historical Provider, MD  albuterol (PROVENTIL HFA;VENTOLIN HFA) 108 (90 Base) MCG/ACT inhaler Inhale 1-2 puffs into the lungs every 4 (four) hours as needed for wheezing or shortness of breath. 07/16/15   Virgel Manifold, MD  clonazePAM (KLONOPIN) 1 MG tablet Take 1 mg by mouth 3 (three) times daily as needed for anxiety.  02/24/15   Historical Provider, MD  dexamethasone (DECADRON) 4 MG tablet Take 3 tablets (12 mg total) by mouth once. 12mg  PO on 07/17/15. 07/16/15   Virgel Manifold, MD  divalproex (DEPAKOTE) 250 MG DR tablet Take 750 mg by mouth at bedtime. 06/27/15   Historical Provider, MD  esomeprazole (NEXIUM) 40 MG capsule Take 40 mg by mouth daily at 12 noon.    Historical Provider, MD  fluticasone (FLONASE) 50 MCG/ACT nasal spray Place 2 sprays into both nostrils daily. 02/03/15   Historical Provider, MD  HYDROcodone-acetaminophen (NORCO/VICODIN) 5-325 MG tablet Take 1 tablet by mouth every 6 (six) hours as needed for severe pain. Patient not  taking: Reported on 06/30/2015 03/10/15   Carmin Muskrat, MD  levothyroxine (SYNTHROID, LEVOTHROID) 150 MCG tablet Take 150 mcg by mouth every morning.     Historical Provider, MD  meloxicam (MOBIC) 15 MG tablet Take 15 mg by mouth daily. 06/03/15   Historical Provider, MD  oxyCODONE (OXY IR/ROXICODONE) 5 MG immediate release tablet Take 1 tablet (5 mg total) by mouth every 6 (six) hours as needed for breakthrough pain. Patient not taking: Reported on 06/30/2015 03/11/15   Leo Grosser, MD  risperiDONE (RISPERDAL) 2 MG tablet Take 2 mg by mouth at bedtime.     Historical Provider, MD    Family History Family History  Problem  Relation Age of Onset  . Arrhythmia    . ALS    . Prostate cancer Maternal Grandfather   . Colon cancer Paternal Grandfather   . Heart disease Mother     Social History Social History  Substance Use Topics  . Smoking status: Current Every Day Smoker    Packs/day: 2.00    Years: 40.00    Types: Cigarettes  . Smokeless tobacco: Never Used  . Alcohol use Yes     Comment: "Never, hardly one at all"     Allergies   Codeine; Lipitor [atorvastatin]; Lithium carbonate [lithium]; Pollen extract; and Talwin [pentazocine]   Review of Systems Review of Systems  Constitutional: Negative for fever.  HENT: Positive for dental problem.   Respiratory: Negative for shortness of breath.   Cardiovascular: Negative for chest pain.  Psychiatric/Behavioral: Positive for suicidal ideas.  All other systems reviewed and are negative.    Physical Exam Updated Vital Signs BP 104/73 (BP Location: Left Arm)   Pulse (!) 58   Temp 97.5 F (36.4 C) (Oral)   Resp 20   Ht 5\' 5"  (1.651 m)   Wt 163 lb (73.9 kg)   SpO2 96%   BMI 27.12 kg/m   Physical Exam  Constitutional: He is oriented to person, place, and time. He appears well-developed and well-nourished.  Mild slurred speech, appears mildly intoxicated  HENT:  Head: Normocephalic and atraumatic.  Right Ear: External ear normal.  Left Ear: External ear normal.  Nose: Nose normal.  Eyes: EOM are normal. Pupils are equal, round, and reactive to light. Right eye exhibits no discharge. Left eye exhibits no discharge.  Neck: Neck supple.  Cardiovascular: Normal rate, regular rhythm and normal heart sounds.   Pulmonary/Chest: Effort normal and breath sounds normal.  Abdominal: Soft. There is no tenderness.  Musculoskeletal: He exhibits no edema.  Neurological: He is alert and oriented to person, place, and time.  CN 3-12 grossly intact. No facial droop. 5/5 strength in all 4 extremities. Grossly normal sensation. Normal finger to nose.     Skin: Skin is warm and dry.  Nursing note and vitals reviewed.    ED Treatments / Results  Labs (all labs ordered are listed, but only abnormal results are displayed) Labs Reviewed  COMPREHENSIVE METABOLIC PANEL - Abnormal; Notable for the following:       Result Value   Creatinine, Ser 1.33 (*)    Total Protein 6.4 (*)    GFR calc non Af Amer 57 (*)    All other components within normal limits  ETHANOL - Abnormal; Notable for the following:    Alcohol, Ethyl (B) 56 (*)    All other components within normal limits  ACETAMINOPHEN LEVEL - Abnormal; Notable for the following:    Acetaminophen (Tylenol), Serum <10 (*)  All other components within normal limits  SALICYLATE LEVEL  CBC  RAPID URINE DRUG SCREEN, HOSP PERFORMED    EKG  EKG Interpretation None       Radiology No results found.  Procedures Procedures (including critical care time)  Medications Ordered in ED Medications - No data to display   Initial Impression / Assessment and Plan / ED Course  I have reviewed the triage vital signs and the nursing notes.  Pertinent labs & imaging results that were available during my care of the patient were reviewed by me and considered in my medical decision making (see chart for details).  Clinical Course as of Jul 29 998  Thu Jul 29, 2016  0517 Slurring could be from lack of sleep or the oxycodone he was given a couple hours ago. Will eval for ETOH as well.   [SG]    Clinical Course User Index [SG] Sherwood Gambler, MD    Patient appears medically stable. Appears mildly intoxicated, likely from oxycodone and ETOH. Given SI will consult TTS for eval when more sober. Care transferred.  Final Clinical Impressions(s) / ED Diagnoses   Final diagnoses:  Polysubstance abuse    New Prescriptions Discharge Medication List as of 07/29/2016  8:18 AM       Sherwood Gambler, MD 07/29/16 1001

## 2016-07-29 NOTE — ED Triage Notes (Signed)
Patient is alert and oriented x4.  Patient is being brought in by [facility] for suicidal thoughts.  On arrival to the ED Patient has an altered gait and slurred speech.  Patient denies any alcohol or drugs.  He adds that he has not slept since 11am yesterday.

## 2016-08-12 ENCOUNTER — Encounter (HOSPITAL_COMMUNITY): Payer: Self-pay

## 2016-08-12 ENCOUNTER — Emergency Department (HOSPITAL_COMMUNITY)
Admission: EM | Admit: 2016-08-12 | Discharge: 2016-08-12 | Disposition: A | Discharge status: Home / Self Care | Payer: Medicaid Other | Attending: Emergency Medicine | Admitting: Emergency Medicine

## 2016-08-12 DIAGNOSIS — F17210 Nicotine dependence, cigarettes, uncomplicated: Secondary | ICD-10-CM | POA: Insufficient documentation

## 2016-08-12 DIAGNOSIS — L240 Irritant contact dermatitis due to detergents: Secondary | ICD-10-CM | POA: Diagnosis not present

## 2016-08-12 DIAGNOSIS — J449 Chronic obstructive pulmonary disease, unspecified: Secondary | ICD-10-CM | POA: Insufficient documentation

## 2016-08-12 DIAGNOSIS — Z79899 Other long term (current) drug therapy: Secondary | ICD-10-CM | POA: Insufficient documentation

## 2016-08-12 DIAGNOSIS — E039 Hypothyroidism, unspecified: Secondary | ICD-10-CM | POA: Diagnosis not present

## 2016-08-12 DIAGNOSIS — L299 Pruritus, unspecified: Secondary | ICD-10-CM | POA: Diagnosis present

## 2016-08-12 DIAGNOSIS — L509 Urticaria, unspecified: Secondary | ICD-10-CM

## 2016-08-12 MED ORDER — DIPHENHYDRAMINE HCL 25 MG PO TABS: 50.0000 mg | ORAL_TABLET | ORAL | 0 refills | Status: DC | PRN

## 2016-08-12 MED ORDER — PREDNISONE 20 MG PO TABS: 40.0000 mg | ORAL_TABLET | Freq: Every day | ORAL | 0 refills | Status: DC

## 2016-08-12 MED ORDER — FAMOTIDINE 20 MG PO TABS: 20.0000 mg | ORAL_TABLET | Freq: Two times a day (BID) | ORAL | 0 refills | Status: DC

## 2016-08-12 MED ORDER — DIPHENHYDRAMINE HCL 50 MG/ML IJ SOLN
25.0000 mg | Freq: Once | INTRAMUSCULAR | Status: AC
Start: 2016-08-12 — End: 2016-08-12
  Administered 2016-08-12: 25 mg via INTRAMUSCULAR
  Filled 2016-08-12: qty 1

## 2016-08-12 MED ORDER — HYDROCORTISONE 2.5 % EX LOTN: TOPICAL_LOTION | Freq: Two times a day (BID) | CUTANEOUS | 0 refills | Status: DC

## 2016-08-12 MED ORDER — FAMOTIDINE 20 MG PO TABS
20.0000 mg | ORAL_TABLET | Freq: Once | ORAL | Status: AC
  Administered 2016-08-12: 20 mg via ORAL
  Filled 2016-08-12: qty 1

## 2016-08-12 MED ORDER — DEXAMETHASONE SODIUM PHOSPHATE 10 MG/ML IJ SOLN
10.0000 mg | Freq: Once | INTRAMUSCULAR | Status: AC
  Administered 2016-08-12: 10 mg via INTRAMUSCULAR
  Filled 2016-08-12: qty 1

## 2016-08-12 NOTE — Discharge Instructions (Signed)
1. Medications: Prednisone, Benadryl, Pepcid, hydrocortisone lotion, usual home medications 2. Treatment: rest, drink plenty of fluids, take medications as prescribed 3. Follow Up: Please followup with your primary doctor in 3 days for discussion of your diagnoses and further evaluation after today's visit; if you do not have a primary care doctor use the resource guide provided to find one; followup with dermatology as needed; Return to the ER for difficulty breathing, return of allergic reaction or other concerning symptoms

## 2016-08-12 NOTE — ED Provider Notes (Signed)
[facility] DEPT Provider Note   CSN: [id number] Arrival date & time: 08/12/16  0017  By signing my name below, I, Evelene Croon, attest that this documentation has been prepared under the direction and in the presence of [employer], PA-C. Electronically Signed: Evelene Croon, Scribe. 08/12/2016. 2:03 AM   History   Chief Complaint Chief Complaint  Patient presents with  . Rash    The history is provided by the patient. No language interpreter was used.     HPI Comments:  [name] [name] is a 61 y.o. male who presents to the Emergency Department complaining of a pruritic rash to his chest and abdomen that he noticed today. Pt believes the rash may have been caused by cheap detergent that he washed his clothes in. He states he had a similar rash ~1 month ago that resolved with treatment. He was discharged from Urgent Care with prednisone that he completed on 08/04/2016. No alleviating factors noted for today's episode; no treatments PTA. Pt has no other acute complaints or associated symptoms at this time. Patient denies difficulty breathing, nausea or vomiting.  Past Medical History:  Diagnosis Date  . Anxiety   . Arthritis   . Asthma   . Bipolar 1 disorder ([place])   . Carpal tunnel syndrome on right 02/04/2016  . Chronic insomnia   . COPD (chronic obstructive pulmonary disease) ([place])   . Depression   . Diaphragmatic hernia   . Dizziness   . ED (erectile dysfunction)   . GERD (gastroesophageal reflux disease)   . HLD (hyperlipidemia)   . Hypothyroidism   . SOB (shortness of breath)   . Tobacco use   . Umbilical hernia without obstruction and without gangrene     Patient Active Problem List   Diagnosis Date Noted  . Carpal tunnel syndrome on right 02/04/2016  . Umbilical hernia 42/70/6237  . Chest pain 03/31/2012  . GERD (gastroesophageal reflux disease) 03/06/2012  . Hypothyroidism 03/06/2012  . COPD (chronic obstructive pulmonary disease) ([place]) 03/06/2012     Past Surgical History:  Procedure Laterality Date  . COLONOSCOPY    . INSERTION OF MESH N/A 01/01/2013   Procedure: INSERTION OF MESH;  Surgeon: Merrie Roof, MD;  Location: [facility];  Service: General;  Laterality: N/A;  . UMBILICAL HERNIA REPAIR N/A 01/01/2013   Procedure: HERNIA REPAIR UMBILICAL ADULT;  Surgeon: Merrie Roof, MD;  Location: [facility];  Service: General;  Laterality: N/A;  . UPPER GI ENDOSCOPY         Home Medications    Prior to Admission medications   Medication Sig Start Date End Date Taking? Authorizing Provider  albuterol (PROVENTIL HFA;VENTOLIN HFA) 108 (90 BASE) MCG/ACT inhaler Inhale 1-2 puffs into the lungs 4 (four) times daily as needed for wheezing or shortness of breath (wheezing).     Historical Provider, MD  albuterol (PROVENTIL HFA;VENTOLIN HFA) 108 (90 Base) MCG/ACT inhaler Inhale 1-2 puffs into the lungs every 4 (four) hours as needed for wheezing or shortness of breath. 07/16/15   Virgel Manifold, MD  clonazePAM (KLONOPIN) 1 MG tablet Take 1 mg by mouth 3 (three) times daily as needed for anxiety.  02/24/15   Historical Provider, MD  dexamethasone (DECADRON) 4 MG tablet Take 3 tablets (12 mg total) by mouth once. 12mg  PO on 07/17/15. 07/16/15   Virgel Manifold, MD  diphenhydrAMINE (BENADRYL) 25 MG tablet Take 2 tablets (50 mg total) by mouth every 4 (four) hours as needed for itching. 08/12/16  Maxtyn Nuzum, PA-C  divalproex (DEPAKOTE) 250 MG DR tablet Take 750 mg by mouth at bedtime. 06/27/15   Historical Provider, MD  esomeprazole (NEXIUM) 40 MG capsule Take 40 mg by mouth daily at 12 noon.    Historical Provider, MD  famotidine (PEPCID) 20 MG tablet Take 1 tablet (20 mg total) by mouth 2 (two) times daily. 08/12/16   Tiziana Cislo, PA-C  fluticasone (FLONASE) 50 MCG/ACT nasal spray Place 2 sprays into both nostrils daily. 02/03/15   Historical Provider, MD  HYDROcodone-acetaminophen (NORCO/VICODIN) 5-325 MG tablet Take 1 tablet by mouth every 6 (six)  hours as needed for severe pain. Patient not taking: Reported on 06/30/2015 03/10/15   Carmin Muskrat, MD  hydrocortisone 2.5 % lotion Apply topically 2 (two) times daily. 08/12/16   Weslee Fogg, PA-C  levothyroxine (SYNTHROID, LEVOTHROID) 150 MCG tablet Take 150 mcg by mouth every morning.     Historical Provider, MD  meloxicam (MOBIC) 15 MG tablet Take 15 mg by mouth daily. 06/03/15   Historical Provider, MD  oxyCODONE (OXY IR/ROXICODONE) 5 MG immediate release tablet Take 1 tablet (5 mg total) by mouth every 6 (six) hours as needed for breakthrough pain. Patient not taking: Reported on 06/30/2015 03/11/15   Leo Grosser, MD  predniSONE (DELTASONE) 20 MG tablet Take 2 tablets (40 mg total) by mouth daily. 08/12/16   Ryoma Nofziger, PA-C  risperiDONE (RISPERDAL) 2 MG tablet Take 2 mg by mouth at bedtime.     Historical Provider, MD    Family History Family History  Problem Relation Age of Onset  . Arrhythmia    . ALS    . Prostate cancer Maternal Grandfather   . Colon cancer Paternal Grandfather   . Heart disease Mother     Social History Social History  Substance Use Topics  . Smoking status: Current Every Day Smoker    Packs/day: 2.00    Years: 40.00    Types: Cigarettes  . Smokeless tobacco: Never Used  . Alcohol use Yes     Comment: "Never, hardly one at all"     Allergies   Codeine; Lipitor [atorvastatin]; Lithium carbonate [lithium]; Pollen extract; and Talwin [pentazocine]   Review of Systems Review of Systems  Constitutional: Negative for fever.  Respiratory: Negative for shortness of breath.   Skin: Positive for rash.  All other systems reviewed and are negative.    Physical Exam Updated Vital Signs BP (!) 130/96 (BP Location: Left Arm)   Pulse 85   Temp 98.6 F (37 C) (Oral)   Resp 16   SpO2 100%   Physical Exam  Constitutional: He is oriented to person, place, and time. He appears well-developed and well-nourished. No distress.  HENT:    Head: Normocephalic and atraumatic.  Right Ear: Tympanic membrane, external ear and ear canal normal.  Left Ear: Tympanic membrane, external ear and ear canal normal.  Nose: Nose normal. No mucosal edema or rhinorrhea.  Mouth/Throat: Uvula is midline. No uvula swelling. No oropharyngeal exudate, posterior oropharyngeal edema, posterior oropharyngeal erythema or tonsillar abscesses.  No swelling of the uvula or oropharynx  Eyes: Conjunctivae are normal.  Neck: Normal range of motion.  Patent airway No stridor; normal phonation Handling secretions without difficulty  Cardiovascular: Normal rate, normal heart sounds and intact distal pulses.   No murmur heard. Pulmonary/Chest: Effort normal and breath sounds normal. No stridor. No respiratory distress. He has no wheezes.  No wheezes or rhonchi  Abdominal: Soft. Bowel sounds are normal. There is no tenderness.  Musculoskeletal: Normal range of motion. He exhibits no edema.  Neurological: He is alert and oriented to person, place, and time.  Skin: Skin is warm and dry. Rash noted. He is not diaphoretic.  Urticaria noted in patches across the extremities and trunk  Mild excoriations - no induration or fluctuance to indicate secondary infection  Psychiatric: He has a normal mood and affect.  Nursing note and vitals reviewed.    ED Treatments / Results  DIAGNOSTIC STUDIES:  Oxygen Saturation is 100% on RA, normal by my interpretation.    COORDINATION OF CARE:  1:55 AM Discussed treatment plan with pt at bedside and pt agreed to plan.   Procedures Procedures (including critical care time)  Medications Ordered in ED Medications  diphenhydrAMINE (BENADRYL) injection 25 mg (not administered)  dexamethasone (DECADRON) injection 10 mg (not administered)  famotidine (PEPCID) tablet 20 mg (not administered)     Initial Impression / Assessment and Plan / ED Course  I have reviewed the triage vital signs and the nursing  notes.  Pertinent labs & imaging results that were available during my care of the patient were reviewed by me and considered in my medical decision making (see chart for details).     Patient is hemodynamically stable, in no respiratory distress, and denies the feeling of throat closing. NO clinical evidence of anaphylaxis. Contact dermatitis due to history and physical with hives. Pt has been advised to take OTC benadryl, Pepcid, prednisone, hydrocortisone & return to the ED if they have a mod-severe allergic rxn (s/s including throat closing, difficulty breathing, swelling of lips face or tongue). Pt is to follow up with their PCP. Pt is agreeable with plan & verbalizes understanding.   Final Clinical Impressions(s) / ED Diagnoses   Final diagnoses:  Irritant contact dermatitis due to detergent  Hives    New Prescriptions New Prescriptions   DIPHENHYDRAMINE (BENADRYL) 25 MG TABLET    Take 2 tablets (50 mg total) by mouth every 4 (four) hours as needed for itching.   FAMOTIDINE (PEPCID) 20 MG TABLET    Take 1 tablet (20 mg total) by mouth 2 (two) times daily.   HYDROCORTISONE 2.5 % LOTION    Apply topically 2 (two) times daily.   PREDNISONE (DELTASONE) 20 MG TABLET    Take 2 tablets (40 mg total) by mouth daily.   I personally performed the services described in this documentation, which was scribed in my presence. The recorded information has been reviewed and is accurate.     Jarrett Soho Heiley Shaikh, PA-C 08/12/16 [place], MD 08/12/16 0700

## 2016-08-12 NOTE — ED Triage Notes (Signed)
Pt complains of whelps on his trunk and arms, he was seen and treated for a rash last month and now it's back worse

## 2016-08-15 ENCOUNTER — Encounter (HOSPITAL_COMMUNITY): Payer: Self-pay | Admitting: Emergency Medicine

## 2016-08-15 ENCOUNTER — Emergency Department (HOSPITAL_COMMUNITY)
Admission: EM | Admit: 2016-08-15 | Discharge: 2016-08-17 | Disposition: A | Discharge status: Other Acute Inpatient Hospital | Payer: Medicaid Other | Attending: Emergency Medicine | Admitting: Emergency Medicine

## 2016-08-15 DIAGNOSIS — J449 Chronic obstructive pulmonary disease, unspecified: Secondary | ICD-10-CM | POA: Diagnosis not present

## 2016-08-15 DIAGNOSIS — Z046 Encounter for general psychiatric examination, requested by authority: Secondary | ICD-10-CM | POA: Diagnosis present

## 2016-08-15 DIAGNOSIS — F17210 Nicotine dependence, cigarettes, uncomplicated: Secondary | ICD-10-CM | POA: Diagnosis not present

## 2016-08-15 DIAGNOSIS — Z79899 Other long term (current) drug therapy: Secondary | ICD-10-CM | POA: Insufficient documentation

## 2016-08-15 DIAGNOSIS — E039 Hypothyroidism, unspecified: Secondary | ICD-10-CM | POA: Insufficient documentation

## 2016-08-15 DIAGNOSIS — F312 Bipolar disorder, current episode manic severe with psychotic features: Secondary | ICD-10-CM | POA: Diagnosis not present

## 2016-08-15 DIAGNOSIS — F319 Bipolar disorder, unspecified: Secondary | ICD-10-CM | POA: Diagnosis present

## 2016-08-15 LAB — COMPREHENSIVE METABOLIC PANEL
ALT: 32 U/L (ref 17–63)
ANION GAP: 11 (ref 5–15)
AST: 26 U/L (ref 15–41)
Albumin: 4.1 g/dL (ref 3.5–5.0)
Alkaline Phosphatase: 53 U/L (ref 38–126)
BUN: 15 mg/dL (ref 6–20)
CHLORIDE: 99 mmol/L — AB (ref 101–111)
CO2: 20 mmol/L — AB (ref 22–32)
CREATININE: 1.12 mg/dL (ref 0.61–1.24)
Calcium: 8.6 mg/dL — ABNORMAL LOW (ref 8.9–10.3)
Glucose, Bld: 136 mg/dL — ABNORMAL HIGH (ref 65–99)
Potassium: 4 mmol/L (ref 3.5–5.1)
SODIUM: 130 mmol/L — AB (ref 135–145)
Total Bilirubin: 0.7 mg/dL (ref 0.3–1.2)
Total Protein: 7 g/dL (ref 6.5–8.1)

## 2016-08-15 LAB — URINALYSIS, ROUTINE W REFLEX MICROSCOPIC
Bilirubin Urine: NEGATIVE
GLUCOSE, UA: NEGATIVE mg/dL
Hgb urine dipstick: NEGATIVE
KETONES UR: NEGATIVE mg/dL
Leukocytes, UA: NEGATIVE
Nitrite: NEGATIVE
PH: 6 (ref 5.0–8.0)
Protein, ur: NEGATIVE mg/dL
SPECIFIC GRAVITY, URINE: 1.003 — AB (ref 1.005–1.030)

## 2016-08-15 LAB — CBC
HEMATOCRIT: 44.5 % (ref 39.0–52.0)
Hemoglobin: 15.5 g/dL (ref 13.0–17.0)
MCH: 30.4 pg (ref 26.0–34.0)
MCHC: 34.8 g/dL (ref 30.0–36.0)
MCV: 87.3 fL (ref 78.0–100.0)
PLATELETS: 290 10*3/uL (ref 150–400)
RBC: 5.1 MIL/uL (ref 4.22–5.81)
RDW: 14.2 % (ref 11.5–15.5)
WBC: 15.2 10*3/uL — AB (ref 4.0–10.5)

## 2016-08-15 LAB — RAPID URINE DRUG SCREEN, HOSP PERFORMED
Amphetamines: NOT DETECTED
Barbiturates: NOT DETECTED
Benzodiazepines: NOT DETECTED
Cocaine: NOT DETECTED
Opiates: NOT DETECTED
Tetrahydrocannabinol: NOT DETECTED

## 2016-08-15 LAB — ETHANOL: Alcohol, Ethyl (B): <5 mg/dL

## 2016-08-15 MED ORDER — NICOTINE 21 MG/24HR TD PT24
21.0000 mg | MEDICATED_PATCH | Freq: Every day | TRANSDERMAL | Status: DC
  Administered 2016-08-16: 21 mg via TRANSDERMAL
  Filled 2016-08-15 (×2): qty 1

## 2016-08-15 MED ORDER — QUETIAPINE FUMARATE 100 MG PO TABS
100.0000 mg | ORAL_TABLET | Freq: Every day | ORAL | Status: DC
  Administered 2016-08-15: 100 mg via ORAL
  Filled 2016-08-15: qty 1

## 2016-08-15 MED ORDER — ZIPRASIDONE MESYLATE 20 MG IM SOLR
10.0000 mg | Freq: Four times a day (QID) | INTRAMUSCULAR | Status: DC | PRN
  Administered 2016-08-16 (×2): 10 mg via INTRAMUSCULAR
  Filled 2016-08-15 (×2): qty 20

## 2016-08-15 NOTE — ED Triage Notes (Signed)
Pt BB [employer], IVC . Per parents pt has hallucinations and possible SI. Pt reported "going to heaven",  per IVC per parents. Pt is loud and appears intoxicated. Hx alcoholism per IVC papers.

## 2016-08-15 NOTE — ED Notes (Signed)
Pt oriented to room and unit.  Pt is very loud and manic.  He his hyper religious.  He states "My brain goes faster than anyones"  He is very delusional stating he is a MD one minute and a Preacher the next.  15 minute checks and video monitoring in place.

## 2016-08-15 NOTE — ED Notes (Signed)
Hourly rounding reveals patient sleeping in room. No complaints, stable, in no acute distress. Q15 minute rounds and monitoring via Security Cameras to continue. 

## 2016-08-15 NOTE — ED Notes (Signed)
Pt refuses to let this RN obtain blood or urine. Pt is verbally aggressive to staff .

## 2016-08-15 NOTE — ED Notes (Signed)
Report to include Situation, Background, Assessment, and Recommendations received from Edie RN. Patient alert and oriented, warm and dry, in no acute distress. Patient denies SI, HI, AVH and pain. Patient made aware of Q15 minute rounds and security cameras for their safety. Patient instructed to come to me with needs or concerns. 

## 2016-08-15 NOTE — ED Notes (Signed)
Hourly rounding reveals patient in room. No complaints, stable, in no acute distress. Q15 minute rounds and monitoring via Security Cameras to continue. 

## 2016-08-15 NOTE — ED Notes (Signed)
Hourly rounding reveals patient in room. No complaints, stable, in no acute distress. Q15 minute rounds and monitoring via Security Cameras to continue.Snack and beverage given. 
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
## 2016-08-15 NOTE — ED Provider Notes (Addendum)
[facility] DEPT Provider Note   CSN: [id number] Arrival date & time: 08/15/16  1443     History   Chief Complaint Chief Complaint  Patient presents with  . IVC  Level V caveat psychiatric complaint. Patient under involuntary psychiatric commitment patient reportedly stated parents he is going to heaven will see later. Talking to himself abuses alcohol daily. Felt to be a danger to himself by his mother who filed IVC affidavit. Patient brought by police. No treatment prior to coming here. Patient denies drinking alcohol recently. He does admit to cigarettes denies illegal drug use  HPI [name] [name] is a 61 y.o. male.  HPI  Past Medical History:  Diagnosis Date  . Anxiety   . Arthritis   . Asthma   . Bipolar 1 disorder ([place])   . Carpal tunnel syndrome on right 02/04/2016  . Chronic insomnia   . COPD (chronic obstructive pulmonary disease) ([place])   . Depression   . Diaphragmatic hernia   . Dizziness   . ED (erectile dysfunction)   . GERD (gastroesophageal reflux disease)   . HLD (hyperlipidemia)   . Hypothyroidism   . SOB (shortness of breath)   . Tobacco use   . Umbilical hernia without obstruction and without gangrene     Patient Active Problem List   Diagnosis Date Noted  . Carpal tunnel syndrome on right 02/04/2016  . Umbilical hernia 78/46/9629  . Chest pain 03/31/2012  . GERD (gastroesophageal reflux disease) 03/06/2012  . Hypothyroidism 03/06/2012  . COPD (chronic obstructive pulmonary disease) ([place]) 03/06/2012    Past Surgical History:  Procedure Laterality Date  . COLONOSCOPY    . INSERTION OF MESH N/A 01/01/2013   Procedure: INSERTION OF MESH;  Surgeon: Merrie Roof, MD;  Location: [facility];  Service: General;  Laterality: N/A;  . UMBILICAL HERNIA REPAIR N/A 01/01/2013   Procedure: HERNIA REPAIR UMBILICAL ADULT;  Surgeon: Merrie Roof, MD;  Location: [facility];  Service: General;  Laterality: N/A;  . UPPER GI ENDOSCOPY         Home Medications      Prior to Admission medications   Medication Sig Start Date End Date Taking? Authorizing Provider  esomeprazole (NEXIUM) 40 MG capsule Take 40 mg by mouth daily at 12 noon.   Yes Historical Provider, MD  levothyroxine (SYNTHROID, LEVOTHROID) 150 MCG tablet Take 150 mcg by mouth every morning.    Yes Historical Provider, MD  QUEtiapine (SEROQUEL) 100 MG tablet Take 100 mg by mouth at bedtime. 08/12/16  Yes Historical Provider, MD  albuterol (PROVENTIL HFA;VENTOLIN HFA) 108 (90 Base) MCG/ACT inhaler Inhale 1-2 puffs into the lungs every 4 (four) hours as needed for wheezing or shortness of breath. Patient not taking: Reported on 08/15/2016 07/16/15   Virgel Manifold, MD  clonazePAM (KLONOPIN) 1 MG tablet Take 1 mg by mouth 3 (three) times daily as needed for anxiety.  02/24/15   Historical Provider, MD  dexamethasone (DECADRON) 4 MG tablet Take 3 tablets (12 mg total) by mouth once. 12mg  PO on 07/17/15. Patient not taking: Reported on 08/15/2016 07/16/15   Virgel Manifold, MD  diphenhydrAMINE (BENADRYL) 25 MG tablet Take 2 tablets (50 mg total) by mouth every 4 (four) hours as needed for itching. Patient not taking: Reported on 08/15/2016 08/12/16   Jarrett Soho Muthersbaugh, PA-C  divalproex (DEPAKOTE) 250 MG DR tablet Take 750 mg by mouth at bedtime. 06/27/15   Historical Provider, MD  famotidine (PEPCID) 20 MG tablet Take 1 tablet (20  mg total) by mouth 2 (two) times daily. Patient not taking: Reported on 08/15/2016 08/12/16   Jarrett Soho Muthersbaugh, PA-C  fluticasone (FLONASE) 50 MCG/ACT nasal spray Place 2 sprays into both nostrils daily. 02/03/15   Historical Provider, MD  HYDROcodone-acetaminophen (NORCO/VICODIN) 5-325 MG tablet Take 1 tablet by mouth every 6 (six) hours as needed for severe pain. Patient not taking: Reported on 06/30/2015 03/10/15   Carmin Muskrat, MD  hydrocortisone 2.5 % lotion Apply topically 2 (two) times daily. Patient not taking: Reported on 08/15/2016 08/12/16   Jarrett Soho Muthersbaugh, PA-C   ipratropium-albuterol (DUONEB) 0.5-2.5 (3) MG/3ML SOLN Take 3 mLs by nebulization every 4 (four) hours as needed for wheezing. 07/22/16   Historical Provider, MD  oxyCODONE (OXY IR/ROXICODONE) 5 MG immediate release tablet Take 1 tablet (5 mg total) by mouth every 6 (six) hours as needed for breakthrough pain. Patient not taking: Reported on 06/30/2015 03/11/15   Leo Grosser, MD  predniSONE (DELTASONE) 20 MG tablet Take 2 tablets (40 mg total) by mouth daily. Patient not taking: Reported on 08/15/2016 08/12/16   Jarrett Soho Muthersbaugh, PA-C  risperiDONE (RISPERDAL) 2 MG tablet Take 2 mg by mouth at bedtime.     Historical Provider, MD  traZODone (DESYREL) 100 MG tablet Take 100 mg by mouth at bedtime. 05/28/16   Historical Provider, MD    Family History Family History  Problem Relation Age of Onset  . Arrhythmia    . ALS    . Prostate cancer Maternal Grandfather   . Colon cancer Paternal Grandfather   . Heart disease Mother     Social History Social History  Substance Use Topics  . Smoking status: Current Every Day Smoker    Packs/day: 2.00    Years: 40.00    Types: Cigarettes  . Smokeless tobacco: Never Used  . Alcohol use Yes     Comment: "Never, hardly one at all"     Allergies   Codeine; Lipitor [atorvastatin]; Lithium carbonate [lithium]; Pollen extract; and Talwin [pentazocine]   Review of Systems Review of Systems  Unable to perform ROS: Psychiatric disorder     Physical Exam Updated Vital Signs BP 137/88 (BP Location: Left Arm)   Pulse 93   Temp 98.2 F (36.8 C) (Oral)   Resp 19   Ht 5\' 5"  (1.651 m)   Wt 169 lb (76.7 kg)   SpO2 97%   BMI 28.12 kg/m   Physical Exam  Constitutional: He appears well-developed and well-nourished.  Alert awake palma, yelling mildly agitated cooperative with exam  HENT:  Head: Normocephalic and atraumatic.  Eyes: Conjunctivae are normal. Pupils are equal, round, and reactive to light.  Neck: Neck supple. No tracheal deviation  present. No thyromegaly present.  Cardiovascular: Normal rate and regular rhythm.   No murmur heard. Pulmonary/Chest: Effort normal and breath sounds normal.  Abdominal: Soft. Bowel sounds are normal. He exhibits no distension. There is no tenderness.  Musculoskeletal: Normal range of motion. He exhibits no edema or tenderness.  Neurological: He is alert. No cranial nerve deficit. Coordination normal.  Gait normal moves all extremities. Commands.  Skin: Skin is warm and dry. No rash noted.  Psychiatric: He has a normal mood and affect.  Nursing note and vitals reviewed.    ED Treatments / Results  Labs (all labs ordered are listed, but only abnormal results are displayed) Labs Reviewed  COMPREHENSIVE METABOLIC PANEL  ETHANOL  CBC  RAPID URINE DRUG SCREEN, HOSP PERFORMED    EKG  EKG Interpretation None  Date: 08/15/2016  Rate: 70  Rhythm: normal sinus rhythm  QRS Axis: normal  Intervals: normal  ST/T Wave abnormalities: normal  Conduction Disutrbances: none  Narrative Interpretation: unremarkable     Radiology No results found.  Procedures Procedures (including critical care time)  Medications Ordered in ED Medications  ziprasidone (GEODON) injection 10 mg (not administered)   Results for orders placed or performed during the hospital encounter of 08/15/16  Comprehensive metabolic panel  Result Value Ref Range   Sodium 130 (L) 135 - 145 mmol/L   Potassium 4.0 3.5 - 5.1 mmol/L   Chloride 99 (L) 101 - 111 mmol/L   CO2 20 (L) 22 - 32 mmol/L   Glucose, Bld 136 (H) 65 - 99 mg/dL   BUN 15 6 - 20 mg/dL   Creatinine, Ser 1.12 0.61 - 1.24 mg/dL   Calcium 8.6 (L) 8.9 - 10.3 mg/dL   Total Protein 7.0 6.5 - 8.1 g/dL   Albumin 4.1 3.5 - 5.0 g/dL   AST 26 15 - 41 U/L   ALT 32 17 - 63 U/L   Alkaline Phosphatase 53 38 - 126 U/L   Total Bilirubin 0.7 0.3 - 1.2 mg/dL   GFR calc non Af Amer >60 >60 mL/min   GFR calc Af Amer >60 >60 mL/min   Anion gap 11 5 - 15    Ethanol  Result Value Ref Range   Alcohol, Ethyl (B) <5 <5 mg/dL  cbc  Result Value Ref Range   WBC 15.2 (H) 4.0 - 10.5 K/uL   RBC 5.10 4.22 - 5.81 MIL/uL   Hemoglobin 15.5 13.0 - 17.0 g/dL   HCT 44.5 39.0 - 52.0 %   MCV 87.3 78.0 - 100.0 fL   MCH 30.4 26.0 - 34.0 pg   MCHC 34.8 30.0 - 36.0 g/dL   RDW 14.2 11.5 - 15.5 %   Platelets 290 150 - 400 K/uL  Rapid urine drug screen (hospital performed)  Result Value Ref Range   Opiates NONE DETECTED NONE DETECTED   Cocaine NONE DETECTED NONE DETECTED   Benzodiazepines NONE DETECTED NONE DETECTED   Amphetamines NONE DETECTED NONE DETECTED   Tetrahydrocannabinol NONE DETECTED NONE DETECTED   Barbiturates NONE DETECTED NONE DETECTED   No results found.  Initial Impression / Assessment and Plan / ED Course  I have reviewed the triage vital signs and the nursing notes.  Pertinent labs & imaging results that were available during my care of the patient were reviewed by me and considered in my medical decision making (see chart for details).     5:40 PM patient is alert ambulatory. He is medically cleared for psychiatric evaluation  Final Clinical Impressions(s) / ED Diagnoses  dXBipolar disorder manic phase, psychotic features Final diagnoses:  None    New Prescriptions New Prescriptions   No medications on file     Orlie Dakin, MD 08/15/16 [place], MD 08/16/16 [phone]

## 2016-08-15 NOTE — ED Notes (Signed)
Bed: WA27 Expected date:  Expected time:  Means of arrival:  Comments: [facility]-IVC

## 2016-08-16 ENCOUNTER — Encounter (HOSPITAL_COMMUNITY): Payer: Self-pay | Admitting: *Deleted

## 2016-08-16 DIAGNOSIS — F319 Bipolar disorder, unspecified: Secondary | ICD-10-CM | POA: Diagnosis present

## 2016-08-16 MED ORDER — CARBAMAZEPINE 200 MG PO TABS
200.0000 mg | ORAL_TABLET | Freq: Two times a day (BID) | ORAL | Status: DC
  Filled 2016-08-16: qty 1

## 2016-08-16 MED ORDER — ASENAPINE MALEATE 5 MG SL SUBL
5.0000 mg | SUBLINGUAL_TABLET | Freq: Two times a day (BID) | SUBLINGUAL | Status: DC
  Administered 2016-08-16 – 2016-08-17 (×2): 5 mg via SUBLINGUAL
  Filled 2016-08-16 (×2): qty 1

## 2016-08-16 MED ORDER — STERILE WATER FOR INJECTION IJ SOLN
INTRAMUSCULAR | Status: AC
  Administered 2016-08-16: 10 mL
  Filled 2016-08-16: qty 10

## 2016-08-16 MED ORDER — TRAZODONE HCL 50 MG PO TABS
50.0000 mg | ORAL_TABLET | Freq: Every evening | ORAL | Status: DC | PRN
  Administered 2016-08-16: 50 mg via ORAL
  Filled 2016-08-16: qty 1

## 2016-08-16 MED ORDER — OLANZAPINE 10 MG PO TBDP
10.0000 mg | ORAL_TABLET | Freq: Once | ORAL | Status: AC
  Administered 2016-08-16: 10 mg via ORAL
  Filled 2016-08-16: qty 1

## 2016-08-16 MED ORDER — QUETIAPINE FUMARATE 100 MG PO TABS: 200.0000 mg | ORAL_TABLET | Freq: Every day | ORAL | Status: DC

## 2016-08-16 MED ORDER — STERILE WATER FOR INJECTION IJ SOLN
INTRAMUSCULAR | Status: AC
Start: 2016-08-16 — End: 2016-08-16
  Administered 2016-08-16: 1 mL
  Filled 2016-08-16: qty 10

## 2016-08-16 MED ORDER — LORAZEPAM 1 MG PO TABS
1.0000 mg | ORAL_TABLET | Freq: Once | ORAL | Status: AC
  Administered 2016-08-16: 1 mg via ORAL
  Filled 2016-08-16: qty 1

## 2016-08-16 MED ORDER — DIPHENHYDRAMINE HCL 25 MG PO CAPS
50.0000 mg | ORAL_CAPSULE | Freq: Once | ORAL | Status: AC
  Administered 2016-08-16: 50 mg via ORAL
  Filled 2016-08-16: qty 2

## 2016-08-16 MED ORDER — LEVOTHYROXINE SODIUM 150 MCG PO TABS
150.0000 ug | ORAL_TABLET | Freq: Every day | ORAL | Status: DC
  Administered 2016-08-16 – 2016-08-17 (×2): 150 ug via ORAL
  Filled 2016-08-16 (×2): qty 1

## 2016-08-16 MED ORDER — HYDROXYZINE HCL 25 MG PO TABS
25.0000 mg | ORAL_TABLET | Freq: Four times a day (QID) | ORAL | Status: DC | PRN
  Administered 2016-08-16 – 2016-08-17 (×2): 25 mg via ORAL
  Filled 2016-08-16 (×2): qty 1

## 2016-08-16 NOTE — ED Notes (Signed)
Hourly rounding reveals patient sleeping in room. No complaints, stable, in no acute distress. Q15 minute rounds and monitoring via Security Cameras to continue. 

## 2016-08-16 NOTE — ED Notes (Addendum)
Pt's relative, Loney Laurence, called, saying that he was calling on behalf of Janai Belmontes's mother. Explained that information could not be given about pt without pt's consent. [facility] said that they would like to be notified when pt is released and said that they are taking out 50Bs now on the pt. Pt's mom's number is [phone].
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
## 2016-08-16 NOTE — Progress Notes (Signed)
08/16/16 1256:  LRT introduced self to pt and offered activities.  LRT informed pt of the activities available.  Pt began talking about how he didn't know where he was going to and that he may fly to [facility].  Pt also talked about how he is a good gambler and how he would win a lot of money at the casinos.  Pt came to the dayroom and watched tv while socializing with peers and staff.  Pt can be loud when talking but is easily redirectable.   [name], LRT/CTRS

## 2016-08-16 NOTE — ED Notes (Signed)
Hourly rounding reveals patient in room. No complaints, stable, in no acute distress. Q15 minute rounds and monitoring via Security Cameras to continue. 

## 2016-08-16 NOTE — ED Notes (Signed)
Patient is rambling with disorganized speech; flight of ideas.  Patient refers to his "mother's insurance and his inheritance."  Patient believe he has "SBI clearance."  He denies any thoughts of self harm.  He denies AVH/HI.  Patient is cooperative.  Needs redirection at times due to talking in a loud voice.

## 2016-08-16 NOTE — ED Notes (Signed)
Hourly rounding reveals patient in room. Stable, in no acute distress. Q15 minute rounds and monitoring via Security Cam[employer] to continue. 

## 2016-08-16 NOTE — ED Notes (Signed)
Pt. c/o anxiety and insomnia.

## 2016-08-16 NOTE — BH Assessment (Addendum)
[facility] Assessment Progress Note  Per Corena Pilgrim, MD, this pt requires psychiatric hospitalization at this time.  Pt presents under IVC initiated by her sister, and upheld by Dr Darleene Cleaver.  The following facilities have been contacted to seek placement for this pt, with results as noted:  Beds available, information sent, decision pending:  [facility]   At capacity:  [name], [place] Triage Specialist [phone]

## 2016-08-16 NOTE — ED Notes (Signed)
Pt. Back up to the nurses station agitated and loudly accusing nurse for treating him for alcohol detox. Redirection not effective.
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
## 2016-08-17 ENCOUNTER — Encounter (HOSPITAL_COMMUNITY): Payer: Self-pay | Admitting: *Deleted

## 2016-08-17 ENCOUNTER — Inpatient Hospital Stay (HOSPITAL_COMMUNITY)
Admission: AD | Admit: 2016-08-17 | Discharge: 2016-08-27 | DRG: 885 | Disposition: A | Discharge status: Home / Self Care | Payer: Medicaid Other | Attending: Physician Assistant | Admitting: Physician Assistant

## 2016-08-17 DIAGNOSIS — M1990 Unspecified osteoarthritis, unspecified site: Secondary | ICD-10-CM | POA: Diagnosis present

## 2016-08-17 DIAGNOSIS — G4700 Insomnia, unspecified: Secondary | ICD-10-CM | POA: Diagnosis present

## 2016-08-17 DIAGNOSIS — Z9114 Patient's other noncompliance with medication regimen: Secondary | ICD-10-CM

## 2016-08-17 DIAGNOSIS — J449 Chronic obstructive pulmonary disease, unspecified: Secondary | ICD-10-CM | POA: Diagnosis present

## 2016-08-17 DIAGNOSIS — E039 Hypothyroidism, unspecified: Secondary | ICD-10-CM | POA: Diagnosis present

## 2016-08-17 DIAGNOSIS — F1020 Alcohol dependence, uncomplicated: Secondary | ICD-10-CM | POA: Diagnosis present

## 2016-08-17 DIAGNOSIS — Z888 Allergy status to other drugs, medicaments and biological substances status: Secondary | ICD-10-CM | POA: Diagnosis not present

## 2016-08-17 DIAGNOSIS — K219 Gastro-esophageal reflux disease without esophagitis: Secondary | ICD-10-CM | POA: Diagnosis present

## 2016-08-17 DIAGNOSIS — Z91048 Other nonmedicinal substance allergy status: Secondary | ICD-10-CM | POA: Diagnosis not present

## 2016-08-17 DIAGNOSIS — Z885 Allergy status to narcotic agent status: Secondary | ICD-10-CM

## 2016-08-17 DIAGNOSIS — F411 Generalized anxiety disorder: Secondary | ICD-10-CM | POA: Diagnosis present

## 2016-08-17 DIAGNOSIS — F3113 Bipolar disorder, current episode manic without psychotic features, severe: Secondary | ICD-10-CM | POA: Diagnosis present

## 2016-08-17 DIAGNOSIS — F17210 Nicotine dependence, cigarettes, uncomplicated: Secondary | ICD-10-CM | POA: Diagnosis present

## 2016-08-17 DIAGNOSIS — E871 Hypo-osmolality and hyponatremia: Secondary | ICD-10-CM | POA: Diagnosis present

## 2016-08-17 DIAGNOSIS — Z79899 Other long term (current) drug therapy: Secondary | ICD-10-CM | POA: Diagnosis not present

## 2016-08-17 DIAGNOSIS — R569 Unspecified convulsions: Secondary | ICD-10-CM | POA: Diagnosis present

## 2016-08-17 DIAGNOSIS — F312 Bipolar disorder, current episode manic severe with psychotic features: Secondary | ICD-10-CM | POA: Diagnosis present

## 2016-08-17 DIAGNOSIS — Z818 Family history of other mental and behavioral disorders: Secondary | ICD-10-CM | POA: Diagnosis not present

## 2016-08-17 MED ORDER — CARBAMAZEPINE ER 200 MG PO TB12
200.0000 mg | ORAL_TABLET | Freq: Two times a day (BID) | ORAL | Status: DC
  Administered 2016-08-17: 200 mg via ORAL
  Filled 2016-08-17 (×5): qty 1

## 2016-08-17 MED ORDER — MAGNESIUM HYDROXIDE 400 MG/5ML PO SUSP: 30.0000 mL | Freq: Every day | ORAL | Status: DC | PRN

## 2016-08-17 MED ORDER — CARBAMAZEPINE ER 200 MG PO TB12: 200.0000 mg | ORAL_TABLET | Freq: Two times a day (BID) | ORAL | Status: DC

## 2016-08-17 MED ORDER — DIPHENHYDRAMINE HCL 50 MG/ML IJ SOLN
50.0000 mg | Freq: Once | INTRAMUSCULAR | Status: AC
  Administered 2016-08-17: 50 mg via INTRAMUSCULAR
  Filled 2016-08-17: qty 1

## 2016-08-17 MED ORDER — HALOPERIDOL 5 MG PO TABS
5.0000 mg | ORAL_TABLET | Freq: Once | ORAL | Status: AC
  Administered 2016-08-17: 5 mg via ORAL
  Filled 2016-08-17: qty 1

## 2016-08-17 MED ORDER — RISPERIDONE 1 MG PO TABS
1.0000 mg | ORAL_TABLET | Freq: Two times a day (BID) | ORAL | Status: DC
  Administered 2016-08-17: 1 mg via ORAL
  Filled 2016-08-17 (×5): qty 1

## 2016-08-17 MED ORDER — LEVOTHYROXINE SODIUM 150 MCG PO TABS
150.0000 ug | ORAL_TABLET | Freq: Every day | ORAL | Status: DC
  Administered 2016-08-18 – 2016-08-27 (×10): 150 ug via ORAL
  Filled 2016-08-17 (×8): qty 1
  Filled 2016-08-17: qty 2
  Filled 2016-08-17 (×5): qty 1

## 2016-08-17 MED ORDER — LORAZEPAM 1 MG PO TABS
2.0000 mg | ORAL_TABLET | Freq: Once | ORAL | Status: AC
  Administered 2016-08-17: 2 mg via ORAL
  Filled 2016-08-17: qty 2

## 2016-08-17 MED ORDER — NICOTINE 21 MG/24HR TD PT24
21.0000 mg | MEDICATED_PATCH | Freq: Every day | TRANSDERMAL | Status: DC
  Filled 2016-08-17 (×10): qty 1

## 2016-08-17 MED ORDER — ACETAMINOPHEN 325 MG PO TABS: 650.0000 mg | ORAL_TABLET | Freq: Four times a day (QID) | ORAL | Status: DC | PRN

## 2016-08-17 MED ORDER — RISPERIDONE 1 MG PO TABS: 1.0000 mg | ORAL_TABLET | Freq: Two times a day (BID) | ORAL | Status: DC

## 2016-08-17 MED ORDER — HYDROXYZINE HCL 25 MG PO TABS
25.0000 mg | ORAL_TABLET | Freq: Four times a day (QID) | ORAL | Status: DC | PRN
  Administered 2016-08-17 – 2016-08-25 (×4): 25 mg via ORAL
  Filled 2016-08-17 (×4): qty 1

## 2016-08-17 MED ORDER — TRAZODONE HCL 50 MG PO TABS
50.0000 mg | ORAL_TABLET | Freq: Every evening | ORAL | Status: DC | PRN
  Administered 2016-08-17 – 2016-08-19 (×2): 50 mg via ORAL
  Filled 2016-08-17 (×2): qty 1

## 2016-08-17 NOTE — BH Assessment (Signed)
[facility] Assessment Progress Note  Per Corena Pilgrim, MD, this pt requires psychiatric hospitalization.  Letitia Libra, RN, [facility] has assigned pt to [facility] Rm [id number]; they will be ready to receive pt at 14:00.  Pt presents under IVC initiated by her mother, and upheld by Dr Darleene Cleaver, and IVC documents have been faxed to [facility].  Pt's nurse, Nena Jordan, has been notified, and agrees to call report to [phone].  Pt is to be transported via Event organiser.   Jalene Mullet, [place] Triage Specialist [phone]

## 2016-08-17 NOTE — Progress Notes (Signed)
Pt currently presents with a anxious affect and hyperactive, restless behavior. Pt speech is rapid and aggressive. Pt reports to writer that their goal is to "get my seroquel and klonopin." Pt blames brother for current family conflict and anxiety. Reports delusions of grandeur tonight. Pt reports intermittent sleep with current medication regimen. Patient main concern is  About financial situation once discharged since he will not be able to live with his family anymore.  Pt provided with medications per providers orders. Pt's labs and vitals were monitored throughout the night. Pt given a 1:1 about emotional and mental status. Pt supported and encouraged to express concerns and questions. Pt educated on medications.  Pt currently denies SI/HI and A/V hallucinations. Pt verbally agrees to seek staff if SI/HI or A/VH occurs and to consult with staff before acting on any harmful thoughts. Will continue POC.

## 2016-08-17 NOTE — ED Notes (Signed)
Pt states that since his saphris this morning he has been increasingly anxious and shaky.  He is pleasant and cooperative.  He remains manic but his voice level is normal today.  He denies S/I, H/I, and AVH. 15 minute checks and video monitoring continue.

## 2016-08-17 NOTE — Tx Team (Signed)
Initial Treatment Plan 08/17/2016 7:35 PM [name] [name] [id number]:[id number]    PATIENT STRESSORS: Financial difficulties Marital or family conflict Occupational concerns   PATIENT STRENGTHS: Ability for insight Active sense of humor Average or above average intelligence Communication skills Motivation for treatment/growth Physical Health Religious Affiliation   PATIENT IDENTIFIED PROBLEMS: Alteration in mood (Depression, Anxiety) "I just worried, my mother don't want me to comeback home, I need housing".  Ineffective coping skills                   DISCHARGE CRITERIA:  Adequate post-discharge living arrangements Improved stabilization in mood, thinking, and/or behavior Motivation to continue treatment in a less acute level of care Verbal commitment to aftercare and medication compliance  PRELIMINARY DISCHARGE PLAN: Outpatient therapy Placement in alternative living arrangements  PATIENT/FAMILY INVOLVEMENT: This treatment plan has been presented to and reviewed with the patient, [name] [name].  The patient have been given the opportunity to ask questions and make suggestions.  Keane Police, RN 08/17/2016, 7:35 PM
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
## 2016-08-17 NOTE — Progress Notes (Signed)
08/17/16 1355:  LRT went to pt room to offer activities, pt declined.  [name], LRT/CTRS

## 2016-08-17 NOTE — ED Notes (Signed)
Pt discharged safely with [employer].  Pt was calm and cooperative.  All belongings were sent with pt.

## 2016-08-18 ENCOUNTER — Encounter (HOSPITAL_COMMUNITY): Payer: Self-pay | Admitting: Psychiatry

## 2016-08-18 DIAGNOSIS — E871 Hypo-osmolality and hyponatremia: Secondary | ICD-10-CM

## 2016-08-18 DIAGNOSIS — Z818 Family history of other mental and behavioral disorders: Secondary | ICD-10-CM

## 2016-08-18 DIAGNOSIS — F1020 Alcohol dependence, uncomplicated: Secondary | ICD-10-CM

## 2016-08-18 DIAGNOSIS — F312 Bipolar disorder, current episode manic severe with psychotic features: Principal | ICD-10-CM

## 2016-08-18 DIAGNOSIS — E039 Hypothyroidism, unspecified: Secondary | ICD-10-CM

## 2016-08-18 DIAGNOSIS — F3113 Bipolar disorder, current episode manic without psychotic features, severe: Secondary | ICD-10-CM | POA: Diagnosis present

## 2016-08-18 DIAGNOSIS — Z79899 Other long term (current) drug therapy: Secondary | ICD-10-CM

## 2016-08-18 MED ORDER — LAMOTRIGINE 25 MG PO TABS
25.0000 mg | ORAL_TABLET | Freq: Every day | ORAL | Status: DC
  Administered 2016-08-18 – 2016-08-23 (×4): 25 mg via ORAL
  Filled 2016-08-18 (×10): qty 1

## 2016-08-18 MED ORDER — OLANZAPINE 10 MG PO TABS
10.0000 mg | ORAL_TABLET | Freq: Three times a day (TID) | ORAL | Status: DC | PRN
Start: 2016-08-18 — End: 2016-08-27
  Administered 2016-08-18 – 2016-08-22 (×4): 10 mg via ORAL
  Filled 2016-08-18 (×6): qty 1

## 2016-08-18 MED ORDER — LORAZEPAM 1 MG PO TABS
1.0000 mg | ORAL_TABLET | Freq: Four times a day (QID) | ORAL | Status: DC | PRN
  Administered 2016-08-18 – 2016-08-26 (×6): 1 mg via ORAL
  Filled 2016-08-18 (×5): qty 1

## 2016-08-18 MED ORDER — OLANZAPINE 10 MG IM SOLR
10.0000 mg | Freq: Three times a day (TID) | INTRAMUSCULAR | Status: DC | PRN
  Filled 2016-08-18: qty 10

## 2016-08-18 MED ORDER — QUETIAPINE 12.5 MG HALF TABLET
12.5000 mg | ORAL_TABLET | Freq: Every day | ORAL | Status: DC
  Administered 2016-08-18 – 2016-08-26 (×7): 12.5 mg via ORAL
  Filled 2016-08-18 (×12): qty 1

## 2016-08-18 MED ORDER — QUETIAPINE FUMARATE 100 MG PO TABS: 100.0000 mg | ORAL_TABLET | Freq: Every day | ORAL | Status: DC

## 2016-08-18 MED ORDER — PANTOPRAZOLE SODIUM 40 MG PO TBEC
40.0000 mg | DELAYED_RELEASE_TABLET | Freq: Two times a day (BID) | ORAL | Status: DC
Start: 2016-08-18 — End: 2016-08-27
  Administered 2016-08-18 – 2016-08-27 (×18): 40 mg via ORAL
  Filled 2016-08-18 (×26): qty 1

## 2016-08-18 MED ORDER — QUETIAPINE 12.5 MG HALF TABLET
12.5000 mg | ORAL_TABLET | Freq: Every day | ORAL | Status: DC
  Administered 2016-08-21 – 2016-08-27 (×7): 12.5 mg via ORAL
  Filled 2016-08-18 (×13): qty 1

## 2016-08-18 MED ORDER — QUETIAPINE FUMARATE 50 MG PO TABS
50.0000 mg | ORAL_TABLET | Freq: Every evening | ORAL | Status: DC | PRN
  Administered 2016-08-18: 50 mg via ORAL
  Filled 2016-08-18 (×4): qty 1

## 2016-08-18 MED ORDER — QUETIAPINE FUMARATE 200 MG PO TABS
200.0000 mg | ORAL_TABLET | Freq: Every day | ORAL | Status: DC
  Administered 2016-08-18 – 2016-08-22 (×5): 200 mg via ORAL
  Filled 2016-08-18 (×9): qty 1

## 2016-08-18 NOTE — BHH Counselor (Signed)
Adult Comprehensive Assessment  Patient ID: [name] [name], male   DOB: [birth year], 61 y.o.   MRN: [id number]  Information Source: Information source: Patient  Current Stressors:  Educational / Learning stressors: N/A  Employment / Job issues: On disabilty  Family Relationships: Patient reports that he is currently experiencing family conflicts with his mother and older brother.  Financial / Lack of resources (include bankruptcy): Fixed income  Housing / Lack of housing: Patient reports he is not returning to his mother's home, and that he has a plan for housing when he discharges.  Physical health (include injuries & life threatening diseases): N/A  Social relationships: N/A  Substance abuse: Patient reports drinking a 32 oz of beer when he is "stressed". Patient denies any other substance use.  Bereavement / Loss: N/A   Living/Environment/Situation:  Living Arrangements: Parent (Patient reports before coming to the hospital that he was living with his mother, however now he can no longer return there. ) Living conditions (as described by patient or guardian): "It's alright, and to be honest I dont know now since I've been in here "  How long has patient lived in current situation?: 5 years  What is atmosphere in current home: Comfortable  Family History:  Marital status: Single Are you sexually active?: No What is your sexual orientation?: Heterosexual  Has your sexual activity been affected by drugs, alcohol, medication, or emotional stress?: N/A  Does patient have children?: No  Childhood History:  By whom was/is the patient raised?: Both parents Additional childhood history information: "My daddy was a great man, taught me everyhting I know"  Description of patient's relationship with caregiver when they were a child: Patient reports that he had a good relationship with both of his parents during his childhood.  Patient's description of current relationship with people who  raised him/her: Patient reports that he currently has a "terrible" relationship with his mother due to conflicts with his brother. Patient reports his father is currently deceased.  How were you disciplined when you got in trouble as a child/adolescent?: N/A  Does patient have siblings?: Yes Number of Siblings: 1 Description of patient's current relationship with siblings: Patient reports not having a relationship with his brother. "I cant trust him for nothing"  Did patient suffer any verbal/emotional/physical/sexual abuse as a child?: No Did patient suffer from severe childhood neglect?: No Has patient ever been sexually abused/assaulted/raped as an adolescent or adult?: No Was the patient ever a victim of a crime or a disaster?: No Witnessed domestic violence?: No Has patient been effected by domestic violence as an adult?: No  Education:  Highest grade of school patient has completed: GED Currently a Ship broker?: No Learning disability?: No  Employment/Work Situation:   Employment situation: On disability Why is patient on disability: Mental illness  How long has patient been on disability: 16 years  Patient's job has been impacted by current illness: No What is the longest time patient has a held a job?: 7 years  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 beer.  If attempted suicide, did drugs/alcohol play a  role in this?: No Alcohol/Substance Abuse Treatment Hx: Denies past history Has alcohol/substance abuse ever caused legal problems?: No  Social Support System:   Heritage manager System: Poor Describe Community Support System: "Everybody trying to tell me what to do with my money, especially my brother and Joselyn Arrow does nothing but listens to him"  Type of faith/religion: N/A  How does patient's faith help to cope with current illness?: N/A   Leisure/Recreation:   Leisure and Hobbies: Building services engineer and listening to music.   Strengths/Needs:   What things does the patient do well?: Smart, hardworking, and talented. "I can play any instrument there is"  In what areas does patient struggle / problems for patient: "Finding my next move, but I have it handled"   Discharge Plan:   Does patient have access to transportation?: No Plan for no access to transportation at discharge: Bus pass Will patient be returning to same living situation after discharge?: No Plan for living situation after discharge: Patient reports he is not returning to his mother's house. He stated that he had a plan for housing at discharge. Denied to describe any further. Currently receiving community mental health services: Yes (From Whom) [facility] ) Does patient have financial barriers related to discharge medications?: No  Summary/Recommendations:   Summary and Recommendations (to be completed by the evaluator): [name] [name] is a 61 year old, Caucasian male who has a history with Bipolar I disorder. He presented to the hospital for treatment for suicidal ideations, aggressive and bizarre behaviors and delusional thoughts. During PSA, [name] [name] presented with rapid/tangential speech and grandios delusions. At times, it was difficult to redirect the patient to complete the assessment process. [name] [name] stated that he came to the hospital because his brother and mother made him upset about financial issues. When asked to go into more  details about the situation, [name] [name] declined. [name] [name] reported that at discharge he does not plan to go back to his mother's house and that he already had plans for housing and work. [name] [name] also stated that is currently being seen at [facility] for medications, however when asked to sign a relase for [name] [name] declined and stated that he wasn't signing anything because he didnt want them "all in his business". CSW intern also attempted to provide [name] [name] with resources for housing, however patient declined, and stated that he did not need the resources. [name] [name] can benefit from crisis stabilization, medication management, therapeutic milieu and referral services.  Radonna Ricker. 08/18/2016

## 2016-08-18 NOTE — Social Work (Signed)
Referred to [facility], is [place] resident.  Tarena Gockley, LCSW Lead Clinical Social Worker Phone:  [phone]  
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
## 2016-08-18 NOTE — Tx Team (Signed)
Interdisciplinary Treatment and Diagnostic Plan Update  08/18/2016 Time of Session: 1:03 PM  [name] [name] MRN: [id number]  Principal Diagnosis: Bipolar disorder, current episode manic severe with psychotic features ([place])  Secondary Diagnoses: Principal Problem:   Bipolar disorder, current episode manic severe with psychotic features ([place]) Active Problems:   Hypothyroidism   Hyponatremia   Alcohol use disorder, moderate, dependence ([place])   Current Medications:  Current Facility-Administered Medications  Medication Dose Route Frequency Provider Last Rate Last Dose  . acetaminophen (TYLENOL) tablet 650 mg  650 mg Oral Q6H PRN Patrecia Pour, NP      . hydrOXYzine (ATARAX/VISTARIL) tablet 25 mg  25 mg Oral Q6H PRN Patrecia Pour, NP   25 mg at 08/17/16 2107  . lamoTRIgine (LAMICTAL) tablet 25 mg  25 mg Oral Daily Ursula Alert, MD   25 mg at 08/18/16 1149  . levothyroxine (SYNTHROID, LEVOTHROID) tablet 150 mcg  150 mcg Oral QAC breakfast Patrecia Pour, NP   150 mcg at 08/18/16 4081  . LORazepam (ATIVAN) tablet 1 mg  1 mg Oral Q6H PRN Ursula Alert, MD   1 mg at 08/18/16 1240  . magnesium hydroxide (MILK OF MAGNESIA) suspension 30 mL  30 mL Oral Daily PRN Patrecia Pour, NP      . nicotine (NICODERM CQ - dosed in mg/24 hours) patch 21 mg  21 mg Transdermal Daily Patrecia Pour, NP      . OLANZapine (ZYPREXA) tablet 10 mg  10 mg Oral TID PRN Ursula Alert, MD       Or  . OLANZapine (ZYPREXA) injection 10 mg  10 mg Intramuscular TID PRN Ursula Alert, MD      . QUEtiapine (SEROQUEL) tablet 12.5 mg  12.5 mg Oral QPC breakfast Saramma Eappen, MD      . QUEtiapine (SEROQUEL) tablet 12.5 mg  12.5 mg Oral QPC lunch Saramma Eappen, MD   12.5 mg at 08/18/16 1201  . QUEtiapine (SEROQUEL) tablet 200 mg  200 mg Oral QHS Saramma Eappen, MD      . traZODone (DESYREL) tablet 50 mg  50 mg Oral QHS PRN Patrecia Pour, NP   50 mg at 08/17/16 2107    PTA Medications: Prescriptions Prior to  Admission  Medication Sig Dispense Refill Last Dose  . albuterol (PROVENTIL HFA;VENTOLIN HFA) 108 (90 Base) MCG/ACT inhaler Inhale 1-2 puffs into the lungs every 4 (four) hours as needed for wheezing or shortness of breath. (Patient not taking: Reported on 08/15/2016) 1 Inhaler 2 Not Taking at Unknown time  . clonazePAM (KLONOPIN) 1 MG tablet TAKE 1 TABLET BY MOUTH 3 TIMES DAILY AS NEEDED FOR ANXIETY  5   . dexamethasone (DECADRON) 4 MG tablet Take 3 tablets (12 mg total) by mouth once. '12mg'$  PO on 07/17/15. (Patient not taking: Reported on 08/15/2016) 3 tablet 0 Not Taking at Unknown time  . diphenhydrAMINE (BENADRYL) 25 MG tablet Take 2 tablets (50 mg total) by mouth every 4 (four) hours as needed for itching. (Patient not taking: Reported on 08/15/2016) 20 tablet 0 Not Taking at Unknown time  . divalproex (DEPAKOTE) 250 MG DR tablet Take 750 mg by mouth at bedtime.  2 Not Taking at Unknown time  . esomeprazole (NEXIUM) 40 MG capsule Take 40 mg by mouth daily at 12 noon.   08/15/2016 at Unknown time  . famotidine (PEPCID) 20 MG tablet Take 1 tablet (20 mg total) by mouth 2 (two) times daily. (Patient not taking: Reported on 08/15/2016) 30  tablet 0 Not Taking at Unknown time  . fluticasone (FLONASE) 50 MCG/ACT nasal spray Place 2 sprays into both nostrils daily.  0 Not Taking at Unknown time  . hydrocortisone 2.5 % lotion Apply topically 2 (two) times daily. (Patient not taking: Reported on 08/15/2016) 59 mL 0 Not Taking at Unknown time  . ipratropium-albuterol (DUONEB) 0.5-2.5 (3) MG/3ML SOLN Take 3 mLs by nebulization every 4 (four) hours as needed for wheezing.  3 Not Taking at Unknown time  . levothyroxine (SYNTHROID, LEVOTHROID) 150 MCG tablet Take 150 mcg by mouth every morning.    unknown  . predniSONE (DELTASONE) 20 MG tablet Take 2 tablets (40 mg total) by mouth daily. (Patient not taking: Reported on 08/15/2016) 10 tablet 0 Not Taking at Unknown time  . QUEtiapine (SEROQUEL) 100 MG tablet Take 100 mg by  mouth at bedtime.  2 08/14/2016 at Unknown time  . risperiDONE (RISPERDAL) 2 MG tablet Take 2 mg by mouth at bedtime.    Not Taking at Unknown time  . traZODone (DESYREL) 100 MG tablet Take 100 mg by mouth at bedtime.   Not Taking at Unknown time    Treatment Modalities: Medication Management, Group therapy, Case management,  1 to 1 session with clinician, Psychoeducation, Recreational therapy.   Physician Treatment Plan for Primary Diagnosis: Bipolar disorder, current episode manic severe with psychotic features ([place]) Long Term Goal(s): Improvement in symptoms so as ready for discharge  Short Term Goals: Ability to demonstrate self-control will improve and Ability to identify triggers associated with substance abuse/mental health issues will improve  Medication Management: Evaluate patient's response, side effects, and tolerance of medication regimen.  Therapeutic Interventions: 1 to 1 sessions, Unit Group sessions and Medication administration.  Evaluation of Outcomes: Not Met  Physician Treatment Plan for Secondary Diagnosis: Principal Problem:   Bipolar disorder, current episode manic severe with psychotic features ([place]) Active Problems:   Hypothyroidism   Hyponatremia   Alcohol use disorder, moderate, dependence ([place])   Long Term Goal(s): Improvement in symptoms so as ready for discharge  Short Term Goals: Compliance with prescribed medications will improve  Medication Management: Evaluate patient's response, side effects, and tolerance of medication regimen.  Therapeutic Interventions: 1 to 1 sessions, Unit Group sessions and Medication administration.  Evaluation of Outcomes: Not Met   RN Treatment Plan for Primary Diagnosis: Bipolar disorder, current episode manic severe with psychotic features ([place]) Long Term Goal(s): Knowledge of disease and therapeutic regimen to maintain health will improve  Short Term Goals: Ability to verbalize frustration and anger appropriately  will improve and Compliance with prescribed medications will improve  Medication Management: RN will administer medications as ordered by provider, will assess and evaluate patient's response and provide education to patient for prescribed medication. RN will report any adverse and/or side effects to prescribing provider.  Therapeutic Interventions: 1 on 1 counseling sessions, Psychoeducation, Medication administration, Evaluate responses to treatment, Monitor vital signs and CBGs as ordered, Perform/monitor CIWA, COWS, AIMS and Fall Risk screenings as ordered, Perform wound care treatments as ordered.  Evaluation of Outcomes: Not Met   LCSW Treatment Plan for Primary Diagnosis: Bipolar disorder, current episode manic severe with psychotic features ([place]) Long Term Goal(s): Safe transition to appropriate next level of care at discharge, Engage patient in therapeutic group addressing interpersonal concerns.  Short Term Goals: Engage patient in aftercare planning with referrals and resources and Increase skills for wellness and recovery  Therapeutic Interventions: Assess for all discharge needs, 1 to 1 time with Social worker,  Explore available resources and support systems, Assess for adequacy in community support network, Educate family and significant other(s) on suicide prevention, Complete Psychosocial Assessment, Interpersonal group therapy.  Evaluation of Outcomes: Not Met   Progress in Treatment: Attending groups: Yes Participating in groups: Yes Taking medication as prescribed: Yes, MD continues to assess for medication changes as needed Toleration medication: Yes, no side effects reported at this time Family/Significant other contact made: No, patient declined.  Patient understands diagnosis: Limited insight  Discussing patient identified problems/goals with staff: Yes Medical problems stabilized or resolved: Yes Denies suicidal/homicidal ideation: Yes  Issues/concerns per patient  self-inventory: None Other: N/A  New problem(s) identified: None identified at this time.   New Short Term/Long Term Goal(s): None identified at this time.   Discharge Plan or Barriers: [facility] with [facility]   Reason for Continuation of Hospitalization: Anxiety Delusions  Mania Medication stabilization Suicidal ideation  Estimated Length of Stay: 3-5 days  Attendees: Patient: 08/18/2016  1:03 PM  Physician: Dr. Shea Evans 08/18/2016  1:03 PM  Nursing: Jan. W , RN  08/18/2016  1:03 PM  RN Care Manager: Lars Pinks 08/18/2016  1:03 PM  Social Worker: Ripley Fraise, [place] 08/18/2016  1:03 PM  Recreational Therapist: Winfield Cunas 08/18/2016  1:03 PM  Other: Radonna Ricker, Social Work Intern  08/18/2016  1:03 PM  Other:  08/18/2016  1:03 PM  Other: 08/18/2016  1:03 PM    Scribe for Treatment Team: Radonna Ricker, Social Work Intern 08/18/2016 1:03 PM

## 2016-08-18 NOTE — Progress Notes (Signed)
Patient cheerful and talkative on unit. Continues to need constant redirection to be on task. Denies pain, SI/HI, AH/VH at this time. Patient awake at this time (12:30 am) pacing back and forth the unit. Patient stated "I can't sleep". Frederico Hamman NP, notified. Ordered Seroquel 50 mg for patient. Patient accepted and in his room at this time. Will continue to monitor patient.

## 2016-08-18 NOTE — Progress Notes (Signed)
D:Pt has been manic talking excessively and tangential in speech. Pt's blood pressure was elevated and reported to NP. Writer was instructed to given prn ativan. CIWA completed and prn medication given. Will continue to monitor.  A:Offered support, encouragement and 15 minute checks. R:Safety maintained on the unit.

## 2016-08-18 NOTE — Progress Notes (Signed)
Recreation Therapy Notes  Date: 08/18/16 Time: 1000 Location: 500 Hall Dayroom  Group Topic: Goal Setting  Goal Area(s) Addresses:  Patient will be able to identify at least 3 life goals.  Patient will be able to identify benefit of investing in life goals.  Patient will be able to identify benefit of setting life goals.   Behavioral Response:  Minimal  Intervention: Setting life goals worksheet  Activity: Goal Setting.  Patients were given a sheet with 6 categories (family, friends, work/school, spirituality, body and mental health).  For each category, patients were to identify what they are doing well, what they need to improve and a goal for making that improvement.  Patients would then identify their top 3 categories for processing.  Education:  Discharge Planning, Radiographer, therapeutic, Leisure Education   Education Outcome: Acknowledges Education/In Group Clarification Provided/Needs Additional Education  Clinical Observations: Pt stated he has set goals a long time ago.  Pt completed his sheet but was rambling on about wanting to take a vacation to [place] or [place].  Pt left early and did not return.   [name], LRT/CTRS     [name] A 08/18/2016 11:49 AM

## 2016-08-18 NOTE — BHH Suicide Risk Assessment (Signed)
[facility] Admission Suicide Risk Assessment   Nursing information obtained from:    Demographic factors:    Current Mental Status:    Loss Factors:    Historical Factors:    Risk Reduction Factors:     Total Time spent with patient: 30 minutes Principal Problem: Bipolar disorder, current episode manic w/o psychotic features, severe ([name] [name]) Diagnosis:   Patient Active Problem List   Diagnosis Date Noted  . Bipolar disorder, current episode manic w/o psychotic features, severe ([name] [name]) [F31.13] 08/18/2016  . Hyponatremia [E87.1] 08/18/2016  . Bipolar affective disorder ([name] [name]) [F31.9] 08/16/2016  . Carpal tunnel syndrome on right [G56.01] 02/04/2016  . Umbilical hernia [B52.4] 12/26/2012  . Chest pain [R07.9] 03/31/2012  . GERD (gastroesophageal reflux disease) [K21.9] 03/06/2012  . Hypothyroidism [E03.9] 03/06/2012  . COPD (chronic obstructive pulmonary disease) ([place]) [J44.9] 03/06/2012   Subjective Data: Please see H&P.   Continued Clinical Symptoms:  Alcohol Use Disorder Identification Test Final Score (AUDIT): 4 The "Alcohol Use Disorders Identification Test", Guidelines for Use in Primary Care, Second Edition.  World Pharmacologist [facility]). Score between 0-7:  no or low risk or alcohol related problems. Score between 8-15:  moderate risk of alcohol related problems. Score between 16-19:  high risk of alcohol related problems. Score 20 or above:  warrants further diagnostic evaluation for alcohol dependence and treatment.   CLINICAL FACTORS:   Alcohol/Substance Abuse/Dependencies Previous Psychiatric Diagnoses and Treatments Medical Diagnoses and Treatments/Surgeries   Musculoskeletal: Strength & Muscle Tone: within normal limits Gait & Station: normal Patient leans: N/A  Psychiatric Specialty Exam: Physical Exam  ROS  Blood pressure (!) 137/97, pulse 79, temperature 98.8 F (37.1 C), temperature source Oral, resp. rate 20, height 5' 5.25" (1.657 m), weight 74.8 kg (165  lb).Body mass index is 27.25 kg/m.  Please see H&P.                                                         COGNITIVE FEATURES THAT CONTRIBUTE TO RISK:  Closed-mindedness, Polarized thinking and Thought constriction (tunnel vision)    SUICIDE RISK:   Mild:  Suicidal ideation of limited frequency, intensity, duration, and specificity.  There are no identifiable plans, no associated intent, mild dysphoria and related symptoms, good self-control (both objective and subjective assessment), few other risk factors, and identifiable protective factors, including available and accessible social support.  PLAN OF CARE: Please see H&P.   I certify that inpatient services furnished can reasonably be expected to improve the patient's condition.   [name] Mcinnis, MD 08/18/2016, 10:46 AM

## 2016-08-18 NOTE — BHH Suicide Risk Assessment (Signed)
[place] INPATIENT:  Family/Significant Other Suicide Prevention Education  Suicide Prevention Education:  Patient Refusal for Family/Significant Other Suicide Prevention Education: The patient [name] [name] has refused to provide written consent for family/significant other to be provided Family/Significant Other Suicide Prevention Education during admission and/or prior to discharge.  Physician notified.  Patient stated "I don't need you calling nobody, I can handle it on my own".   Radonna Ricker 08/18/2016, 10:11 AM
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
## 2016-08-19 DIAGNOSIS — F17210 Nicotine dependence, cigarettes, uncomplicated: Secondary | ICD-10-CM

## 2016-08-19 MED ORDER — TRAZODONE HCL 100 MG PO TABS
100.0000 mg | ORAL_TABLET | Freq: Every evening | ORAL | Status: DC | PRN
  Administered 2016-08-19 – 2016-08-24 (×6): 100 mg via ORAL
  Filled 2016-08-19 (×6): qty 1

## 2016-08-19 MED ORDER — ALUM & MAG HYDROXIDE-SIMETH 200-200-20 MG/5ML PO SUSP
30.0000 mL | Freq: Four times a day (QID) | ORAL | Status: DC | PRN
  Administered 2016-08-19 – 2016-08-23 (×2): 30 mL via ORAL
  Filled 2016-08-19 (×2): qty 30

## 2016-08-19 NOTE — Progress Notes (Signed)
[facility] MD Progress Note  08/19/2016 4:35 PM [place]  MRN:  [id number] Subjective:  Patient is upset that he is not on his Klonopin. Objective:  [place] came in for ETOH abuse.  Has hx of Bipolar D/O, presented with IVC petition for worsening mania.  Collateral information from family states that patient was not sleeping and was paranoid.  He was non compliant with meds and abusing alcohol.  Patient is labile, at times can sit through a group session and can be irritable and walk away from staff when they  Attempting to talk with him.    Principal Problem: Bipolar disorder, current episode manic severe with psychotic features [facility]) Diagnosis:   Patient Active Problem List   Diagnosis Date Noted  . Hyponatremia [E87.1] 08/18/2016  . Alcohol use disorder, moderate, dependence ([place]) [F10.20] 08/18/2016  . Bipolar disorder, current episode manic severe with psychotic features ([place]) [F31.2] 08/18/2016  . Bipolar affective disorder ([place]) [F31.9] 08/16/2016  . Carpal tunnel syndrome on right [G56.01] 02/04/2016  . Umbilical hernia [I50.2] 12/26/2012  . Chest pain [R07.9] 03/31/2012  . GERD (gastroesophageal reflux disease) [K21.9] 03/06/2012  . Hypothyroidism [E03.9] 03/06/2012  . COPD (chronic obstructive pulmonary disease) ([place]) [J44.9] 03/06/2012   Total Time spent with patient: 30 minutes  Past Psychiatric History: see HPI  Past Medical History:  Past Medical History:  Diagnosis Date  . Anxiety   . Arthritis   . Asthma   . Bipolar 1 disorder ([place])   . Carpal tunnel syndrome on right 02/04/2016  . Chronic insomnia   . COPD (chronic obstructive pulmonary disease) ([place])   . Depression   . Diaphragmatic hernia   . Dizziness   . ED (erectile dysfunction)   . GERD (gastroesophageal reflux disease)   . HLD (hyperlipidemia)   . Hypothyroidism   . SOB (shortness of breath)   . Tobacco use   . Umbilical hernia without obstruction and without gangrene     Past Surgical History:   Procedure Laterality Date  . COLONOSCOPY    . INSERTION OF MESH N/A 01/01/2013   Procedure: INSERTION OF MESH;  Surgeon: Merrie Roof, MD;  Location: [place];  Service: General;  Laterality: N/A;  . UMBILICAL HERNIA REPAIR N/A 01/01/2013   Procedure: HERNIA REPAIR UMBILICAL ADULT;  Surgeon: Merrie Roof, MD;  Location: [place];  Service: General;  Laterality: N/A;  . UPPER GI ENDOSCOPY     Family History:  Family History  Problem Relation Age of Onset  . Arrhythmia    . ALS    . Prostate cancer Maternal Grandfather   . Colon cancer Paternal Grandfather   . Heart disease Mother   . Arthritis Mother   . Mental illness Brother    Family Psychiatric  History: see HPI Social History:  History  Alcohol Use  . Yes    Comment: "Never, hardly one at all"     History  Drug Use No    Social History   Social History  . Marital status: Single    Spouse name: N/A  . Number of children: 0  . Years of education: N/A   Occupational History  . disabled    Social History Main Topics  . Smoking status: Current Every Day Smoker    Packs/day: 2.00    Years: 40.00    Types: Cigarettes  . Smokeless tobacco: Never Used  . Alcohol use Yes     Comment: "Never, hardly one at all"  . Drug  use: No  . Sexual activity: Not Asked   Other Topics Concern  . None   Social History Narrative  . None   Additional Social History:                         Sleep: Fair  Appetite:  Fair  Current Medications: Current Facility-Administered Medications  Medication Dose Route Frequency Provider Last Rate Last Dose  . acetaminophen (TYLENOL) tablet 650 mg  650 mg Oral Q6H PRN Patrecia Pour, NP      . alum & mag hydroxide-simeth (MAALOX/MYLANTA) 200-200-20 MG/5ML suspension 30 mL  30 mL Oral Q6H PRN Laverle Hobby, PA-C   30 mL at 08/19/16 0253  . hydrOXYzine (ATARAX/VISTARIL) tablet 25 mg  25 mg Oral Q6H PRN Patrecia Pour, NP   25 mg at 08/17/16 2107  . lamoTRIgine (LAMICTAL)  tablet 25 mg  25 mg Oral Daily Ursula Alert, MD   25 mg at 08/18/16 1149  . levothyroxine (SYNTHROID, LEVOTHROID) tablet 150 mcg  150 mcg Oral QAC breakfast Patrecia Pour, NP   150 mcg at 08/19/16 0929  . LORazepam (ATIVAN) tablet 1 mg  1 mg Oral Q6H PRN Ursula Alert, MD   1 mg at 08/18/16 1240  . magnesium hydroxide (MILK OF MAGNESIA) suspension 30 mL  30 mL Oral Daily PRN Patrecia Pour, NP      . nicotine (NICODERM CQ - dosed in mg/24 hours) patch 21 mg  21 mg Transdermal Daily Patrecia Pour, NP      . OLANZapine (ZYPREXA) tablet 10 mg  10 mg Oral TID PRN Ursula Alert, MD   10 mg at 08/18/16 2312   Or  . OLANZapine (ZYPREXA) injection 10 mg  10 mg Intramuscular TID PRN Ursula Alert, MD      . pantoprazole (PROTONIX) EC tablet 40 mg  40 mg Oral BID AC Saramma Eappen, MD   40 mg at 08/19/16 0930  . QUEtiapine (SEROQUEL) tablet 12.5 mg  12.5 mg Oral QPC breakfast Saramma Eappen, MD      . QUEtiapine (SEROQUEL) tablet 12.5 mg  12.5 mg Oral QPC lunch Saramma Eappen, MD   12.5 mg at 08/18/16 1201  . QUEtiapine (SEROQUEL) tablet 200 mg  200 mg Oral QHS Ursula Alert, MD   200 mg at 08/18/16 2103  . traZODone (DESYREL) tablet 50 mg  50 mg Oral QHS PRN Patrecia Pour, NP   50 mg at 08/19/16 0121    Lab Results: No results found for this or any previous visit (from the past 25 hour(s)).  Blood Alcohol level:  Lab Results  Component Value Date   ETH <5 08/15/2016   ETH 56 (H) 93/57/0177    Metabolic Disorder Labs: No results found for: HGBA1C, MPG No results found for: PROLACTIN No results found for: CHOL, TRIG, HDL, CHOLHDL, VLDL, LDLCALC  Physical Findings: AIMS: Facial and Oral Movements Muscles of Facial Expression: None, normal Lips and Perioral Area: None, normal Jaw: None, normal Tongue: None, normal,Extremity Movements Upper (arms, wrists, hands, fingers): None, normal Lower (legs, knees, ankles, toes): None, normal, Trunk Movements Neck, shoulders, hips: None, normal,  Overall Severity Severity of abnormal movements (highest score from questions above): None, normal Incapacitation due to abnormal movements: None, normal Patient's awareness of abnormal movements (rate only patient's report): No Awareness, Dental Status Current problems with teeth and/or dentures?: Yes Does patient usually wear dentures?: Yes (Not with pt at present.)  CIWA:  CIWA-Ar Total: 9 COWS:     Musculoskeletal: Strength & Muscle Tone: within normal limits Gait & Station: normal Patient leans: N/A  Psychiatric Specialty Exam: Physical Exam  Nursing note and vitals reviewed.   ROS  Blood pressure (!) 143/97, pulse 89, temperature 98.8 F (37.1 C), temperature source Oral, resp. rate 18, height 5' 5.25" (1.657 m), weight 74.8 kg (165 lb), SpO2 98 %.Body mass index is 27.25 kg/m.  General Appearance: Fairly Groomed  Eye Contact:  Fair  Speech:  Clear and Coherent and Pressured  Volume:  Normal  Mood:  Depressed  Affect:  Appropriate  Thought Process:  Linear  Orientation:  Full (Time, Place, and Person)  Thought Content:  Rumination  Suicidal Thoughts:  No  Homicidal Thoughts:  No  Memory:  Immediate;   Fair Recent;   Fair Remote;   Fair  Judgement:  Intact  Insight:  Fair  Psychomotor Activity:  Normal  Concentration:  Concentration: Fair and Attention Span: Fair  Recall:  [employer] of Knowledge:  Fair  Language:  Fair  Akathisia:  Yes  Handed:  Right  AIMS (if indicated):     Assets:  Desire for Improvement Physical Health Resilience Social Support  ADL's:  Intact  Cognition:  WNL  Sleep:  Number of Hours: 0.75   Treatment Plan Summary: Review of chart, vital signs, medications, and notes.  1-Individual and group therapy  2-Medication management for depression and anxiety: Medications reviewed with the patient..  3-Coping skills for depression, anxiety  4-Continue crisis stabilization and management  5-Address health issues--monitoring vital signs,  stable  6-Treatment plan in progress to prevent relapse of depression and anxiety  Janett Labella, NP [facility] 08/19/2016, 4:35 PM   Agree with NP Progress Note

## 2016-08-19 NOTE — [facility] Group Notes (Signed)
Type of Therapy:  Group Therapy   Participation Level:  Engaged  Participation Quality:  Attentive  Affect:  Appropriate   Cognitive:  Alert   Insight:  Engaged  Engagement in Therapy:  Improving   Mode s of Intervention:  Education, Exploration, Socialization   Summary of Progress/Problems: Engaged throughout, stayed entire time.   [name] [name] from the [facility] was here to tell her story of recovery and inform patients about [facility] and their services.

## 2016-08-19 NOTE — Progress Notes (Signed)
Patient denies SI, HI and AVH this shift.  Patient has had no incidents of behavioral dsycontrol.  Patient has been compliant with medications and attended groups.   Assess patient for safety offer medications as prescribed, engage patient in 1:1 staff talks.   Continue to monitor as prescribed.

## 2016-08-19 NOTE — Progress Notes (Signed)
[place] Group Notes:  (Nursing/MHT/Case Management/Adjunct)  Date:  08/19/2016  Time:  8:36 PM  Type of Therapy:  Psychoeducational Skills  Participation Level:  Minimal  Participation Quality:  Inattentive and Monopolizing  Affect:  Excited  Cognitive:  Lacking  Insight:  Lacking  Engagement in Group:  Monopolizing  Modes of Intervention:  Education  Summary of Progress/Problems: Patient attended group but refused to share. He had to be redirected for speaking out of turn and for attempting to use the telephone.  Archie Balboa S 08/19/2016, 8:36 PM

## 2016-08-19 NOTE — BHH Counselor (Signed)
CSW intern provided patient with housing resources. CSW will continue to follow and assess for further discharge planning options.   Radonna Ricker, Student Social Work Intern  [employer]  [phone]

## 2016-08-19 NOTE — Progress Notes (Signed)
Patient ID: [name] [name], male   DOB: 11/11/1955, 61 y.o.   MRN: [id number] D: Client pacing the hall, complains of not being able to sleep "how can you sleep when they opening the door in your room every fifteen minutes checking on you" "I came in here to get some sleep, I been up for sixty hours" "I like the room dark, how am I going to sleep when you keep opening the door" "my momma took out a 50B on me" "my brother is the trouble maker, he's putting all this in her head" "I want to know if they increase the Trazodone" "If you give me the medicine I need, I can sleep"  client continue to ramble most of the shift, until peers went to bed, which left him with no one to listen, it's then when client quieted down. A: Writer provided emotional support noting checks would continue as scheduled. Medications reviewed administered as ordered. Staff will continue to monitor q73min for safety. R: Client is safe on the unit, attended group.

## 2016-08-19 NOTE — Progress Notes (Signed)
Recreation Therapy Notes  Date: 08/19/16 Time: 950 Location: 500 Hall Dayroom  Group Topic: Coping Skills  Goal Area(s) Addresses:  Patient will be able to identity positive coping skills. Patient will be able to identify benefits of coping skills.  Intervention: Mindmap, dry erase board, dry erase marker  Activity: Mindmap.  Patients were given a worksheet of a blank mindmap.  LRT and patients filled in the first eight boxes together with situations that would warrant the use of coping skills.  Patients were to then come up with coping skills for each situation individually.  Once finished, LRT would fill in the rest of the mindmap from the coping skills the patients came up with.  Patients would also be able to fill in any blank spaces they were unable to come up with coping skills for.   Education: Radiographer, therapeutic, Dentist.   Education Outcome: Acknowledges understanding/In group clarification offered/Needs additional education.   Clinical Observations/Feedback: Pt did not attend group.   [name], LRT/CTRS         [name] A 08/19/2016 11:24 AM

## 2016-08-19 NOTE — Progress Notes (Signed)
D: Pt is tangential, manic and delusional states, "I have been dealing with all this for 16 years; I didn't get my regular medications yesterday but when I told the doctor how much I know about pharmacology, within 2 minute they gave me all my medications back." Pt at this time denies SI, HI, depression, anxiety, pain or AVH; "I was put here because I was up for only 60 hrs; "this is normal for me especially when I have important stuff to do; all I need now is to sleep for 2 days and I will be ready to go." A: Medications offered as prescribed. All patient's questions and concerns addressed. Support, encouragement, and safe environment provided. 15-minute safety checks continue.   R: Pt was med compliant. Pt attended wrap-up group. Safety checks continue

## 2016-08-19 NOTE — Progress Notes (Signed)
[place] Group Notes:  (Nursing/MHT/Case Management/Adjunct)  Date:  08/19/2016  Time:  3:15 AM  Type of Therapy:  Psychoeducational Skills  Participation Level:  Active  Participation Quality:  Monopolizing  Affect:  Flat  Cognitive:  Lacking  Insight:  Limited  Engagement in Group:  Engaged  Modes of Intervention:  Education  Summary of Progress/Problems: The patient stated in group that he is in the hospital since he is having difficulty sleeping. He also states that he made some telephone calls earlier in the day to address some of his issues.   Yanelle Sousa S 08/19/2016, 3:15 AM

## 2016-08-20 ENCOUNTER — Inpatient Hospital Stay (HOSPITAL_COMMUNITY): Payer: Medicaid Other

## 2016-08-20 ENCOUNTER — Other Ambulatory Visit: Payer: Self-pay

## 2016-08-20 ENCOUNTER — Encounter (HOSPITAL_COMMUNITY): Payer: Self-pay | Admitting: Emergency Medicine

## 2016-08-20 LAB — CBC WITH DIFFERENTIAL/PLATELET
BASOS PCT: 0 %
Basophils Absolute: 0 10*3/uL (ref 0.0–0.1)
Eosinophils Absolute: 0.2 10*3/uL (ref 0.0–0.7)
Eosinophils Relative: 1 %
HCT: 39.9 % (ref 39.0–52.0)
Hemoglobin: 13.8 g/dL (ref 13.0–17.0)
LYMPHS PCT: 18 %
Lymphs Abs: 2.5 10*3/uL (ref 0.7–4.0)
MCH: 29.9 pg (ref 26.0–34.0)
MCHC: 34.6 g/dL (ref 30.0–36.0)
MCV: 86.6 fL (ref 78.0–100.0)
MONO ABS: 1.4 10*3/uL — AB (ref 0.1–1.0)
MONOS PCT: 10 %
NEUTROS ABS: 9.9 10*3/uL — AB (ref 1.7–7.7)
Neutrophils Relative %: 71 %
Platelets: 212 10*3/uL (ref 150–400)
RBC: 4.61 MIL/uL (ref 4.22–5.81)
RDW: 14 % (ref 11.5–15.5)
WBC: 14 10*3/uL — ABNORMAL HIGH (ref 4.0–10.5)

## 2016-08-20 LAB — ETHANOL: Alcohol, Ethyl (B): <5 mg/dL (ref <5)

## 2016-08-20 LAB — RAPID URINE DRUG SCREEN, HOSP PERFORMED
AMPHETAMINES: NOT DETECTED
Barbiturates: NOT DETECTED
Benzodiazepines: NOT DETECTED
Cocaine: NOT DETECTED
Opiates: NOT DETECTED
Tetrahydrocannabinol: NOT DETECTED

## 2016-08-20 LAB — COMPREHENSIVE METABOLIC PANEL
ALBUMIN: 3.8 g/dL (ref 3.5–5.0)
ALK PHOS: 46 U/L (ref 38–126)
ALT: 31 U/L (ref 17–63)
ANION GAP: 10 (ref 5–15)
AST: 44 U/L — AB (ref 15–41)
BUN: 9 mg/dL (ref 6–20)
CO2: 23 mmol/L (ref 22–32)
Calcium: 8.6 mg/dL — ABNORMAL LOW (ref 8.9–10.3)
Chloride: 95 mmol/L — ABNORMAL LOW (ref 101–111)
Creatinine, Ser: 1.28 mg/dL — ABNORMAL HIGH (ref 0.61–1.24)
GFR calc Af Amer: >60 mL/min (ref >60)
GFR calc non Af Amer: 59 mL/min — ABNORMAL LOW (ref >60)
GLUCOSE: 88 mg/dL (ref 65–99)
Potassium: 3.8 mmol/L (ref 3.5–5.1)
SODIUM: 128 mmol/L — AB (ref 135–145)
Total Bilirubin: 0.7 mg/dL (ref 0.3–1.2)
Total Protein: 6 g/dL — ABNORMAL LOW (ref 6.5–8.1)

## 2016-08-20 MED ORDER — KETOROLAC TROMETHAMINE 30 MG/ML IJ SOLN
INTRAMUSCULAR | Status: AC
  Administered 2016-08-20: 23:00:00
  Filled 2016-08-20: qty 1

## 2016-08-20 MED ORDER — LIDOCAINE 5 % EX PTCH
1.0000 | MEDICATED_PATCH | CUTANEOUS | Status: DC
  Administered 2016-08-20 – 2016-08-26 (×6): 1 via TRANSDERMAL
  Filled 2016-08-20 (×9): qty 1

## 2016-08-20 MED ORDER — LIDOCAINE 5 % EX PTCH
MEDICATED_PATCH | CUTANEOUS | Status: AC
  Filled 2016-08-20: qty 1

## 2016-08-20 MED ORDER — LORAZEPAM 2 MG/ML IJ SOLN
2.0000 mg | Freq: Once | INTRAMUSCULAR | Status: AC
  Administered 2016-08-20: 2 mg via INTRAMUSCULAR

## 2016-08-20 MED ORDER — KETOROLAC TROMETHAMINE 60 MG/2ML IM SOLN
30.0000 mg | Freq: Once | INTRAMUSCULAR | Status: AC
  Filled 2016-08-20: qty 2

## 2016-08-20 MED ORDER — SODIUM CHLORIDE 0.9 % IV BOLUS (SEPSIS)
1000.0000 mL | Freq: Once | INTRAVENOUS | Status: AC
  Administered 2016-08-20: 1000 mL via INTRAVENOUS

## 2016-08-20 NOTE — Progress Notes (Signed)
D.  Pt returned from ED after seizure earlier today.  Report stated that CT scan was negative.  Pt did complain of left knee pain and of knee locking up on him.  Pt rated pain a 10/10.  Pt states he is afraid he will fall.  Pt requested and received wheelchair.  Pt is now rated a high fall risk with precautions in place and Pt education completed, reinforcement needed.  Pt has stated several times during shift that he will get a lawyer and sue due to his knee injury.  Pt denies SI/HI/hallucinations at this time. Pt states he does not sleep at night.   A.  Support and encouragement offered, medications given as ordered.  NP notified of knee pain, new order received.  R. Pt remains safe at this time, will continue to monitor.

## 2016-08-20 NOTE — ED Provider Notes (Signed)
[facility] DEPT Provider Note   CSN: [id number] Arrival date & time: 08/20/16  1333     History   Chief Complaint Chief Complaint  Patient presents with  . Seizures    HPI [name] [name] is a 61 y.o. male.  HPI   61 year old male with history of bipolar, anxiety, depression presenting for seizure-like activities.  Pt was Admitted to [name] 5 days ago when family member have IVC pt due to [place].  Pt denies SI.  He report being started on a new medication, Seroquel 2 weeks ago.  He was checking the medication side effect on his phone and sts his mom may have heard him saying something about suicide and thought he was suicidal.  He has been at [facility] x 5 days and report not sleeping x 4 days.  Today while at the cafeteria, a nurse tech notices pt developed Tremors to arms while in cafeteria, lasting 1-2 minutes.  Pt was guided down to a seated position and provider were notified.  Pt sent here for further evaluation of potential seizure.  Pt is a poor historian.  He did not recall the event.  He did came to when EMS arrived and brought pt here.  Pt currently report No headache, vision changes, sob, cough, palpitation, cp, neck pain, swelling, numbness/tingling.  Did report having hurt L knee for more than 6 months, but may have tweaked it while playing basketball today.  He denies any recent alcohol consumption.  Denies recent drug use.  Did not bite tongue or urinating on himself.      Past Medical History:  Diagnosis Date  . Anxiety   . Arthritis   . Asthma   . Bipolar 1 disorder ([place])   . Carpal tunnel syndrome on right 02/04/2016  . Chronic insomnia   . COPD (chronic obstructive pulmonary disease) ([place])   . Depression   . Diaphragmatic hernia   . Dizziness   . ED (erectile dysfunction)   . GERD (gastroesophageal reflux disease)   . HLD (hyperlipidemia)   . Hypothyroidism   . SOB (shortness of breath)   . Tobacco use   . Umbilical hernia without obstruction and without gangrene       Patient Active Problem List   Diagnosis Date Noted  . Hyponatremia 08/18/2016  . Alcohol use disorder, moderate, dependence ([place]) 08/18/2016  . Bipolar disorder, current episode manic severe with psychotic features ([place]) 08/18/2016  . Bipolar affective disorder ([place]) 08/16/2016  . Carpal tunnel syndrome on right 02/04/2016  . Umbilical hernia 15/17/6160  . Chest pain 03/31/2012  . GERD (gastroesophageal reflux disease) 03/06/2012  . Hypothyroidism 03/06/2012  . COPD (chronic obstructive pulmonary disease) ([place]) 03/06/2012    Past Surgical History:  Procedure Laterality Date  . COLONOSCOPY    . INSERTION OF MESH N/A 01/01/2013   Procedure: INSERTION OF MESH;  Surgeon: Merrie Roof, MD;  Location: [place];  Service: General;  Laterality: N/A;  . UMBILICAL HERNIA REPAIR N/A 01/01/2013   Procedure: HERNIA REPAIR UMBILICAL ADULT;  Surgeon: Merrie Roof, MD;  Location: [place];  Service: General;  Laterality: N/A;  . UPPER GI ENDOSCOPY         Home Medications    Prior to Admission medications   Medication Sig Start Date End Date Taking? Authorizing Provider  albuterol (PROVENTIL HFA;VENTOLIN HFA) 108 (90 Base) MCG/ACT inhaler Inhale 1-2 puffs into the lungs every 4 (four) hours as needed for wheezing or shortness of breath. Patient not  taking: Reported on 08/15/2016 07/16/15   Virgel Manifold, MD  clonazePAM (KLONOPIN) 1 MG tablet TAKE 1 TABLET BY MOUTH 3 TIMES DAILY AS NEEDED FOR ANXIETY 07/20/16   Historical Provider, MD  dexamethasone (DECADRON) 4 MG tablet Take 3 tablets (12 mg total) by mouth once. 12mg  PO on 07/17/15. Patient not taking: Reported on 08/15/2016 07/16/15   Virgel Manifold, MD  diphenhydrAMINE (BENADRYL) 25 MG tablet Take 2 tablets (50 mg total) by mouth every 4 (four) hours as needed for itching. Patient not taking: Reported on 08/15/2016 08/12/16   Jarrett Soho Muthersbaugh, PA-C  divalproex (DEPAKOTE) 250 MG DR tablet Take 750 mg by mouth at bedtime. 06/27/15   Historical  Provider, MD  esomeprazole (NEXIUM) 40 MG capsule Take 40 mg by mouth daily at 12 noon.    Historical Provider, MD  famotidine (PEPCID) 20 MG tablet Take 1 tablet (20 mg total) by mouth 2 (two) times daily. Patient not taking: Reported on 08/15/2016 08/12/16   Jarrett Soho Muthersbaugh, PA-C  fluticasone (FLONASE) 50 MCG/ACT nasal spray Place 2 sprays into both nostrils daily. 02/03/15   Historical Provider, MD  hydrocortisone 2.5 % lotion Apply topically 2 (two) times daily. Patient not taking: Reported on 08/15/2016 08/12/16   Jarrett Soho Muthersbaugh, PA-C  ipratropium-albuterol (DUONEB) 0.5-2.5 (3) MG/3ML SOLN Take 3 mLs by nebulization every 4 (four) hours as needed for wheezing. 07/22/16   Historical Provider, MD  levothyroxine (SYNTHROID, LEVOTHROID) 150 MCG tablet Take 150 mcg by mouth every morning.     Historical Provider, MD  predniSONE (DELTASONE) 20 MG tablet Take 2 tablets (40 mg total) by mouth daily. Patient not taking: Reported on 08/15/2016 08/12/16   Jarrett Soho Muthersbaugh, PA-C  QUEtiapine (SEROQUEL) 100 MG tablet Take 100 mg by mouth at bedtime. 08/12/16   Historical Provider, MD  risperiDONE (RISPERDAL) 2 MG tablet Take 2 mg by mouth at bedtime.     Historical Provider, MD  traZODone (DESYREL) 100 MG tablet Take 100 mg by mouth at bedtime. 05/28/16   Historical Provider, MD    Family History Family History  Problem Relation Age of Onset  . Arrhythmia    . ALS    . Prostate cancer Maternal Grandfather   . Colon cancer Paternal Grandfather   . Heart disease Mother   . Arthritis Mother   . Mental illness Brother     Social History Social History  Substance Use Topics  . Smoking status: Current Every Day Smoker    Packs/day: 2.00    Years: 40.00    Types: Cigarettes  . Smokeless tobacco: Never Used  . Alcohol use Yes     Comment: "Never, hardly one at all"     Allergies   Codeine; Lipitor [atorvastatin]; Lithium carbonate [lithium]; Pollen extract; and Talwin [pentazocine]   Review of  Systems Review of Systems  All other systems reviewed and are negative.    Physical Exam Updated Vital Signs BP 106/81 (BP Location: Right Arm)   Pulse 75   Temp 98.5 F (36.9 C) (Oral)   Resp 18   Ht 5\' 5"  (1.651 m)   Wt 74.8 kg   SpO2 97%   BMI 27.46 kg/m   Physical Exam  Constitutional: He is oriented to person, place, and time. He appears well-developed and well-nourished. No distress.  Resting comfortably, non toxic in appearance.   HENT:  Head: Atraumatic.  No signs of head injury  No tongue injury  Eyes: Conjunctivae are normal.  Neck: Normal range of motion. Neck supple.  No  nuchal rigidity  Cardiovascular: Normal rate and regular rhythm.   Pulmonary/Chest: Effort normal and breath sounds normal.  Abdominal: Soft. Bowel sounds are normal. He exhibits no distension. There is no tenderness.  Musculoskeletal: He exhibits tenderness (Left knee: Tenderness to medial and lateral joint line on palpation with normal knee flexion extension and no joint deformity, no bruising noted).  Neurological: He is alert and oriented to person, place, and time. He has normal strength. He displays no tremor. No cranial nerve deficit or sensory deficit. He displays no seizure activity. GCS eye subscore is 4. GCS verbal subscore is 5. GCS motor subscore is 6.  Able to ambulate  Skin: No rash noted.  Psychiatric: He has a normal mood and affect. Thought content is not paranoid. He expresses no homicidal and no suicidal ideation.  Nursing note and vitals reviewed.    ED Treatments / Results  Labs (all labs ordered are listed, but only abnormal results are displayed) Labs Reviewed - No data to display  EKG  EKG Interpretation None       Radiology No results found.  Procedures Procedures (including critical care time)  Medications Ordered in ED Medications  acetaminophen (TYLENOL) tablet 650 mg (not administered)  magnesium hydroxide (MILK OF MAGNESIA) suspension 30 mL (not  administered)  nicotine (NICODERM CQ - dosed in mg/24 hours) patch 21 mg (21 mg Transdermal Not Given 08/20/16 0736)  hydrOXYzine (ATARAX/VISTARIL) tablet 25 mg (25 mg Oral Given 08/17/16 2107)  levothyroxine (SYNTHROID, LEVOTHROID) tablet 150 mcg (150 mcg Oral Given 08/20/16 0612)  QUEtiapine (SEROQUEL) tablet 200 mg (200 mg Oral Given 08/19/16 2238)  lamoTRIgine (LAMICTAL) tablet 25 mg (25 mg Oral Not Given 08/20/16 0736)  QUEtiapine (SEROQUEL) tablet 12.5 mg (12.5 mg Oral Not Given 08/19/16 0931)  QUEtiapine (SEROQUEL) tablet 12.5 mg (12.5 mg Oral Not Given 08/19/16 1242)  LORazepam (ATIVAN) tablet 1 mg (1 mg Oral Given 08/18/16 1240)  OLANZapine (ZYPREXA) tablet 10 mg (10 mg Oral Given 08/18/16 2312)    Or  OLANZapine (ZYPREXA) injection 10 mg ( Intramuscular See Alternative 08/18/16 2312)  pantoprazole (PROTONIX) EC tablet 40 mg (40 mg Oral Given 08/20/16 0612)  alum & mag hydroxide-simeth (MAALOX/MYLANTA) 200-200-20 MG/5ML suspension 30 mL (30 mLs Oral Given 08/19/16 0253)  traZODone (DESYREL) tablet 100 mg (100 mg Oral Given 08/19/16 2238)     Initial Impression / Assessment and Plan / ED Course  I have reviewed the triage vital signs and the nursing notes.  Pertinent labs & imaging results that were available during my care of the patient were reviewed by me and considered in my medical decision making (see chart for details).     BP 106/81   Pulse 75   Temp 98.5 F (36.9 C) (Oral)   Resp 18   Ht 5\' 5"  (1.651 m)   Wt 74.8 kg   SpO2 97%   BMI 27.46 kg/m    Final Clinical Impressions(s) / ED Diagnoses   Final diagnoses:  Seizure ([place])    New Prescriptions New Prescriptions   No medications on file   3:01 PM Pt here with seizure like activity earlier today.  Was recently started on Seroquel 2 weeks ago according to pt.  Suspect arm tremors may be 2/2 to medication.  Although seizure-like activity may be DT from alcohol withdrawal, this is less likely.  Will perform medical  screening exam and monitor.    3:32 PM Pt sign out to Dr. Thomasene Lot who will f/u on pt's labs and determine  disposition.  Anticipate f/u outpt with neurology for his new onset seizure. Recommend avoid driving until clear by neurology.    Domenic Moras, PA-C 08/20/16 [place], DO 08/20/16 1545

## 2016-08-20 NOTE — Progress Notes (Signed)
[facility] MD Progress Note  08/20/2016 12:32 PM [name] [name]  MRN:  [id number] Subjective:  Patient reports he is feeling well . States he slept better last night , but even so,states he only slept " maybe three hours ". Denies medication side effects at this time.  Objective:   I have discussed case with treatment team and have met with patient . Patient is a 61 year old who has been diagnosed with Bipolar Disorder , admitted under commitment initiated by family.  Patient continues to present with pressured speech, disorganized thought process, but has improved partially since admission. Insight is limited, and states he feels there were no actual grounds for commitment, but he states he does have Bipolar Disorder, and knows he needs treatment. Attributes his psychiatric symptoms to sleep deprivation prior to admission. As per staff patient has been hyper-verbal , at times disruptive in groups, but able to be redirected, no violent or threatening behaviors .  At this time denies medication side effects. Denies any suicidal ideations. Patient also has history of alcohol abuse - admission BAL on 4/8 was < 5. At this time no symptoms of alcohol WDL noted or reported . No tremors or diaphoresis.  Vitals are stable. Vitals:   08/20/16 0544 08/20/16 0546  BP: 114/70 119/74  Pulse: 68 74  Resp: 16   Temp:        Principal Problem: Bipolar disorder, current episode manic severe with psychotic features ([place]) Diagnosis:   Patient Active Problem List   Diagnosis Date Noted  . Hyponatremia [E87.1] 08/18/2016  . Alcohol use disorder, moderate, dependence ([place]) [F10.20] 08/18/2016  . Bipolar disorder, current episode manic severe with psychotic features ([place]) [F31.2] 08/18/2016  . Bipolar affective disorder ([place]) [F31.9] 08/16/2016  . Carpal tunnel syndrome on right [G56.01] 02/04/2016  . Umbilical hernia [Q25.9] 12/26/2012  . Chest pain [R07.9] 03/31/2012  . GERD (gastroesophageal reflux disease)  [K21.9] 03/06/2012  . Hypothyroidism [E03.9] 03/06/2012  . COPD (chronic obstructive pulmonary disease) ([place]) [J44.9] 03/06/2012   Total Time spent with patient: 20 minutes  Past Psychiatric History: see HPI  Past Medical History:  Past Medical History:  Diagnosis Date  . Anxiety   . Arthritis   . Asthma   . Bipolar 1 disorder ([place])   . Carpal tunnel syndrome on right 02/04/2016  . Chronic insomnia   . COPD (chronic obstructive pulmonary disease) ([place])   . Depression   . Diaphragmatic hernia   . Dizziness   . ED (erectile dysfunction)   . GERD (gastroesophageal reflux disease)   . HLD (hyperlipidemia)   . Hypothyroidism   . SOB (shortness of breath)   . Tobacco use   . Umbilical hernia without obstruction and without gangrene     Past Surgical History:  Procedure Laterality Date  . COLONOSCOPY    . INSERTION OF MESH N/A 01/01/2013   Procedure: INSERTION OF MESH;  Surgeon: Merrie Roof, MD;  Location: [facility];  Service: General;  Laterality: N/A;  . UMBILICAL HERNIA REPAIR N/A 01/01/2013   Procedure: HERNIA REPAIR UMBILICAL ADULT;  Surgeon: Merrie Roof, MD;  Location: [facility];  Service: General;  Laterality: N/A;  . UPPER GI ENDOSCOPY     Family History:  Family History  Problem Relation Age of Onset  . Arrhythmia    . ALS    . Prostate cancer Maternal Grandfather   . Colon cancer Paternal Grandfather   . Heart disease Mother   . Arthritis Mother   .  Mental illness Brother    Family Psychiatric  History: see HPI Social History:  History  Alcohol Use  . Yes    Comment: "Never, hardly one at all"     History  Drug Use No    Social History   Social History  . Marital status: Single    Spouse name: N/A  . Number of children: 0  . Years of education: N/A   Occupational History  . disabled    Social History Main Topics  . Smoking status: Current Every Day Smoker    Packs/day: 2.00    Years: 40.00    Types: Cigarettes  . Smokeless tobacco: Never Used   . Alcohol use Yes     Comment: "Never, hardly one at all"  . Drug use: No  . Sexual activity: Not Asked   Other Topics Concern  . None   Social History Narrative  . None   Additional Social History:   Sleep: Fair  Appetite:  Fair  Current Medications: Current Facility-Administered Medications  Medication Dose Route Frequency Provider Last Rate Last Dose  . acetaminophen (TYLENOL) tablet 650 mg  650 mg Oral Q6H PRN Patrecia Pour, NP      . alum & mag hydroxide-simeth (MAALOX/MYLANTA) 200-200-20 MG/5ML suspension 30 mL  30 mL Oral Q6H PRN Laverle Hobby, PA-C   30 mL at 08/19/16 0253  . hydrOXYzine (ATARAX/VISTARIL) tablet 25 mg  25 mg Oral Q6H PRN Patrecia Pour, NP   25 mg at 08/17/16 2107  . lamoTRIgine (LAMICTAL) tablet 25 mg  25 mg Oral Daily Ursula Alert, MD   25 mg at 08/18/16 1149  . levothyroxine (SYNTHROID, LEVOTHROID) tablet 150 mcg  150 mcg Oral QAC breakfast Patrecia Pour, NP   150 mcg at 08/20/16 4801  . LORazepam (ATIVAN) tablet 1 mg  1 mg Oral Q6H PRN Ursula Alert, MD   1 mg at 08/18/16 1240  . magnesium hydroxide (MILK OF MAGNESIA) suspension 30 mL  30 mL Oral Daily PRN Patrecia Pour, NP      . nicotine (NICODERM CQ - dosed in mg/24 hours) patch 21 mg  21 mg Transdermal Daily Patrecia Pour, NP      . OLANZapine (ZYPREXA) tablet 10 mg  10 mg Oral TID PRN Ursula Alert, MD   10 mg at 08/18/16 2312   Or  . OLANZapine (ZYPREXA) injection 10 mg  10 mg Intramuscular TID PRN Ursula Alert, MD      . pantoprazole (PROTONIX) EC tablet 40 mg  40 mg Oral BID AC Saramma Eappen, MD   40 mg at 08/20/16 0612  . QUEtiapine (SEROQUEL) tablet 12.5 mg  12.5 mg Oral QPC breakfast Saramma Eappen, MD      . QUEtiapine (SEROQUEL) tablet 12.5 mg  12.5 mg Oral QPC lunch Saramma Eappen, MD   12.5 mg at 08/18/16 1201  . QUEtiapine (SEROQUEL) tablet 200 mg  200 mg Oral QHS Ursula Alert, MD   200 mg at 08/19/16 2238  . traZODone (DESYREL) tablet 100 mg  100 mg Oral QHS PRN Rozetta Nunnery, NP   100 mg at 08/19/16 2238    Lab Results: No results found for this or any previous visit (from the past 48 hour(s)).  Blood Alcohol level:  Lab Results  Component Value Date   ETH <5 08/15/2016   ETH 56 (H) 65/53/7482    Metabolic Disorder Labs: No results found for: HGBA1C, MPG No results found for: PROLACTIN No  results found for: CHOL, TRIG, HDL, CHOLHDL, VLDL, LDLCALC  Physical Findings: AIMS: Facial and Oral Movements Muscles of Facial Expression: None, normal Lips and Perioral Area: None, normal Jaw: None, normal Tongue: None, normal,Extremity Movements Upper (arms, wrists, hands, fingers): None, normal Lower (legs, knees, ankles, toes): None, normal, Trunk Movements Neck, shoulders, hips: None, normal, Overall Severity Severity of abnormal movements (highest score from questions above): None, normal Incapacitation due to abnormal movements: None, normal Patient's awareness of abnormal movements (rate only patient's report): No Awareness, Dental Status Current problems with teeth and/or dentures?: Yes Does patient usually wear dentures?: Yes (Not with pt at present.)  CIWA:  CIWA-Ar Total: 9 COWS:     Musculoskeletal: Strength & Muscle Tone: within normal limits Gait & Station: normal Patient leans: N/A  Psychiatric Specialty Exam: Physical Exam  Nursing note and vitals reviewed.   ROS no headache, no chest pain , no shortness of breath   Blood pressure 119/74, pulse 74, temperature 98.8 F (37.1 C), temperature source Oral, resp. rate 16, height 5' 5.25" (1.657 m), weight 74.8 kg (165 lb), SpO2 98 %.Body mass index is 27.25 kg/m.  General Appearance: Fairly Groomed  Eye Contact:  Good  Speech:  Pressured  Volume:  Normal  Mood:  reports he is feeling well, denies depression at this time  Affect:  Labile and slightly irritable   Thought Process:  Disorganized and Descriptions of Associations: Tangential  Orientation:  Full (Time, Place, and  Person)  Thought Content:  denies hallucinations, no delusions, not internally preoccupied   Suicidal Thoughts:  No denies any suicidal or self injurious ideations , denies any homicidal or violent ideations   Homicidal Thoughts:  No  Memory:  Recent and remote grossly intact   Judgement:  Fair  Insight:  Fair  Psychomotor Activity:  pacing on unit at times   Concentration:  Concentration: Fair and Attention Span: Fair  Recall:  [employer] of Knowledge:  Fair  Language:  Good  Akathisia:  Negative  Handed:  Right  AIMS (if indicated):     Assets:  Communication Skills Desire for Improvement Resilience  ADL's:  Improving   Cognition:  WNL  Sleep:  Number of Hours: 3    Assessment - patient remains pressured in speech, somewhat disorganized in thought process, and sleep remains poor. He is , however, partially improved compared to yesterday. Thus far tolerating medications well, denies side effects.   Treatment Plan Summary: Continue to encourage group, milieu participation to work on coping skills and symptom reduction Increase Seroquel to 25 mgrs BID and 250 mgrs QHS for mood disorder  Continue Lamictal 25 mgrs QDAY for mood disorder  Continue Ativan 1 mgr Q 6 hours PRN for anxiety or potential symptoms of WDL . Treatment team working on disposition planning options  [name] Campus, MD  08/20/2016, 12:32 PM   Patient ID: [name] [name], male   DOB: 1955/11/17, 61 y.o.   MRN: [id number]   Addendum 08/20/16 at 1,00 PM  Patient had a witnessed seizure in cafeteria. Nursing staff, writer responded . Lasted 2-3 minutes .Was given Ativan IM . Seizure resolved patient gradually recovered consciousness, but presented confused, post-ictal. EMS contacted, transported to ED .   [name] Earing MD

## 2016-08-20 NOTE — ED Notes (Signed)
Ambulated Pt to door as 1 assist. Pt denied dizziness, SOB, but c/o knee pain. Assisted Pt back to bed.

## 2016-08-20 NOTE — ED Triage Notes (Signed)
Pt transferred from [facility] due to having a witnessed seizure while in cafeteria.

## 2016-08-20 NOTE — ED Notes (Signed)
Dr. Mackuen at bedside  

## 2016-08-20 NOTE — ED Notes (Signed)
Report called to [facility], [place], [employer] called for transport, pt is under IVC

## 2016-08-20 NOTE — Progress Notes (Signed)
Recreation Therapy Notes  Date: 08/20/16 Time: 1000 Location: 500 Hall Dayroom  Group Topic: Communication, Team Building, Problem Solving  Goal Area(s) Addresses:  Patient will effectively work with peer towards shared goal.  Patient will identify skill used to make activity successful.  Patient will identify how skills used during activity can be used to reach post d/c goals.   Behavioral Response: None  Intervention: STEM Activity   Activity: [employer]. In teams, patients were asked to build the tallest freestanding tower possible out of 15 pipe cleaners. Systematically resources were removed, for example patient ability to use both hands and patient ability to verbally communicate.    Education: Education officer, community, Dentist.   Education Outcome: Acknowledges education/In group clarification offered/Needs additional education.   Clinical Observations/Feedback:  Pt was in and out of group.  Pt was disruptive but able to be redirected.  Pt was focused on going home and his brother trying to turn his mother against him.  Pt left and did not return.   [name], LRT/CTRS    Ria Comment, Sherif Millspaugh A 08/20/2016 11:18 AM

## 2016-08-20 NOTE — ED Notes (Signed)
Pt transported by [employer] back to [facility]

## 2016-08-20 NOTE — Progress Notes (Addendum)
Pt did not attend the evening wrap up group. Pt was at [facility] for eval Clint Bolder, [place] 08/20/16 9:58 PM

## 2016-08-20 NOTE — ED Notes (Signed)
Pt is to be d/c back to [facility] [id number]`. No further seizure activity noted. A & O. Sitter at bedside

## 2016-08-20 NOTE — Discharge Instructions (Addendum)
Please do not drive or operate machinery until being cleared by a neurologist for your seizure activity.  Follow up with [facility] for further evaluation of your seizure activity.  \You will need to get labs redrawn in 2 days to make sure that your sodium remains normal

## 2016-08-20 NOTE — ED Notes (Signed)
Pt has been given 2 Sprites

## 2016-08-20 NOTE — Progress Notes (Signed)
Pt has been talking about his knee hurting after he fell playing basketball in the courtyard earlier today. RN was notified. Pt was given both ice and heat packs to help with the discomfort and told to try to rest his knee. Pt has been up pacing the hallway and says that helps. Pt has made multiple comments about falling and both this Probation officer, another tech on the unit, and his RN have educated pt on falls and to sit down if he feels unsteady, notify staff and to use his call bell in his room for assistance. Pt asked for a wheelchair and one was provided to him. Pt was educated on how to use wheelchair and to lock the wheels from moving when transferring from the wheelchair to his bed.

## 2016-08-21 NOTE — Progress Notes (Signed)
The patient requested that this staff member locate a foot rest for his wheelchair. This Pryor Curia has observed the patient walking the hallways without a wheelchair or any staff assistance.

## 2016-08-21 NOTE — [facility] Group Notes (Signed)
[facility] Group Notes:  (Nursing/MHT/Case Management/Adjunct)  Date:  08/21/2016  Time:  3:42 PM  Type of Therapy:  Nurse Education  Participation Level:  Active  Participation Quality:  Appropriate  Affect:  Labile  Cognitive:  Disorganized  Insight:  Lacking  Engagement in Group:  distracted  Modes of Intervention:  mindfulness meditation  Summary of Progress/Problems: Patient attended mindfulness meditation group for most of the group, left quietly and appropriately and returned before the end. Stated that he has studied martial arts and biofeedback.  Royston Cowper Marelyn Rouser 08/21/2016, 3:42 PM

## 2016-08-21 NOTE — [facility] Group Notes (Signed)
[facility] Group Notes:  (Clinical Social Work)  08/21/2016  10:00-11:00AM  Summary of Progress/Problems:   Today's process group involved patients discussing their feelings related to being hospitalized and what they would be doing instead if at home.  This led to talking about how to stay well and therefore out of the hospital.  Among the activities listed were taking medications on time, finding hobbies to pursue that provide a source of fun, talking with a professional counselor to vent feelings and figure out solutions to problems, and more.  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  He was fixated throughout group on the belief that his [facility] psychiatric provider did not want him to take a mood stabilizer "so I'm not supposed to be on Depakote, but I guess I'll take it if that makes ya'll think I'm cooperating."  He was intrusive and manic, pacing the floor during group and having to be reminded multiple times to sit down.  Other items of fixation for him were his brother, mother, and perceived mistreatments.  He was in and out of the room several times, quite disruptive.  Type of Therapy:  Group Therapy - Process  Participation Level:  Active  Participation Quality:  Intrusive, Monopolizing and Redirectable  Affect:  Blunted  Cognitive:  Disorganized  Insight:  Limited  Engagement in Therapy:  Improving  Modes of Intervention:  Exploration, Discussion  Selmer Dominion, LCSW 08/21/2016, 12:46 PM

## 2016-08-21 NOTE — Progress Notes (Signed)
D: The patient has been irritable at times this evening. First of all, the patient became loud and argumentative as he looked at the hallway schedule and inquired about having a prayer service on Sunday. Secondly, the patient became irritable after he got water on the floor in his bathroom and bedroom. He insisted that he needed more towels for his shower and that his shades had been left open in his room prior to him getting out of the shower. He also became loud after the tv was changed to a less violent station.

## 2016-08-21 NOTE — Progress Notes (Signed)
Patient attended group and did not answer the questions the writer asked him. In fact, he said he could not state his name because stating his name will land his in jail.

## 2016-08-21 NOTE — Progress Notes (Signed)
D: Patient's self inventory sheet: patient has good sleep, no sleep medication.good  Appetite, hyper energy level, good concentration. Rated depression 0/10, hopeless 0/10, anxiety 0/10. SI/HI/AVH: Denies all. Physical complaints are 10+ pain in knee from yesterday's injury. 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 Plans to work on "all ready in place". Patient very verbal, adamant about blaming brother for his predicament. States that mother was unable to get restraining order so is unable to keep him from returning home. Initially refused mood stabilizer but then took it stating "I don't want to be kept longer for refusing meds".  A: Medications administered, assessed medication knowledge and education given on medication regimen.  Emotional support and encouragement given patient. R: Denies SI and HI , contracts for safety. Safety maintained with 15 minute checks.

## 2016-08-21 NOTE — Progress Notes (Signed)
[facility] MD Progress Note  08/21/2016 12:32 PM [name] [name]  MRN:  [id number] Subjective:  Im better today. I feel better. Only thing is I injured my knee about 1.5 ago and I re-injured it yesterday when I threw the football. I had a seizure but all my lab work came back normal and my head scan.    Objective:   I have discussed case with treatment team and have met with patient . Patient is a 61 year old who has been diagnosed with Bipolar Disorder, admitted under commitment initiated by family.  Patient is observed pacing up and down the hallway with staff. He continues to remain with pressured speech, and rumination about his bipolar disorder.  Insight is limited, and states he is snot suicidal nor does he have bipolar I. He proceeds to ask what is the difference between bipolar I and bipolar II. Discussed with him his findings of lab results yesterday. Discussed with his hyponatremia, and it can cause neuro cognition impairment.  As per nursing: Pt returned from ED after seizure earlier today.  Report stated that CT scan was negative.  Pt did complain of left knee pain and of knee locking up on him.  Pt rated pain a 10/10.  Pt states he is afraid he will fall.  Pt requested and received wheelchair.  Pt is now rated a high fall risk with precautions in place and Pt education completed, reinforcement needed.  Pt has stated several times during shift that he will get a lawyer and sue due to his knee injury.  Pt denies SI/HI/hallucinations at this time. Pt states he does not sleep at night.   A.  Support and encouragement offered, medications given as ordered.  NP notified of knee pain, new order received.  R. Pt remains safe at this time, will continue to monitor.   At this time denies medication side effects. Denies any suicidal ideations. Patient also has history of alcohol abuse - admission BAL on 4/8 was < 5. At this time no symptoms of alcohol WDL noted or reported . No tremors or diaphoresis.   Vitals are stable. Vitals:   08/21/16 0547 08/21/16 0548  BP: (!) 119/49 126/89  Pulse: 87 97  Resp: 18   Temp:        Principal Problem: Bipolar disorder, current episode manic severe with psychotic features ([place]) Diagnosis:   Patient Active Problem List   Diagnosis Date Noted  . Hyponatremia [E87.1] 08/18/2016  . Alcohol use disorder, moderate, dependence ([place]) [F10.20] 08/18/2016  . Bipolar disorder, current episode manic severe with psychotic features ([place]) [F31.2] 08/18/2016  . Bipolar affective disorder ([place]) [F31.9] 08/16/2016  . Carpal tunnel syndrome on right [G56.01] 02/04/2016  . Umbilical hernia [I71.2] 12/26/2012  . Chest pain [R07.9] 03/31/2012  . GERD (gastroesophageal reflux disease) [K21.9] 03/06/2012  . Hypothyroidism [E03.9] 03/06/2012  . COPD (chronic obstructive pulmonary disease) ([place]) [J44.9] 03/06/2012   Total Time spent with patient: 20 minutes  Past Psychiatric History: see HPI  Past Medical History:  Past Medical History:  Diagnosis Date  . Anxiety   . Arthritis   . Asthma   . Bipolar 1 disorder ([place])   . Carpal tunnel syndrome on right 02/04/2016  . Chronic insomnia   . COPD (chronic obstructive pulmonary disease) ([place])   . Depression   . Diaphragmatic hernia   . Dizziness   . ED (erectile dysfunction)   . GERD (gastroesophageal reflux disease)   . HLD (hyperlipidemia)   . Hypothyroidism   .  SOB (shortness of breath)   . Tobacco use   . Umbilical hernia without obstruction and without gangrene     Past Surgical History:  Procedure Laterality Date  . COLONOSCOPY    . INSERTION OF MESH N/A 01/01/2013   Procedure: INSERTION OF MESH;  Surgeon: Merrie Roof, MD;  Location: [facility];  Service: General;  Laterality: N/A;  . UMBILICAL HERNIA REPAIR N/A 01/01/2013   Procedure: HERNIA REPAIR UMBILICAL ADULT;  Surgeon: Merrie Roof, MD;  Location: [facility];  Service: General;  Laterality: N/A;  . UPPER GI ENDOSCOPY     Family History:  Family  History  Problem Relation Age of Onset  . Arrhythmia    . ALS    . Prostate cancer Maternal Grandfather   . Colon cancer Paternal Grandfather   . Heart disease Mother   . Arthritis Mother   . Mental illness Brother    Family Psychiatric  History: see HPI Social History:  History  Alcohol Use  . Yes    Comment: "Never, hardly one at all"     History  Drug Use No    Social History   Social History  . Marital status: Single    Spouse name: N/A  . Number of children: 0  . Years of education: N/A   Occupational History  . disabled    Social History Main Topics  . Smoking status: Current Every Day Smoker    Packs/day: 2.00    Years: 40.00    Types: Cigarettes  . Smokeless tobacco: Never Used  . Alcohol use Yes     Comment: "Never, hardly one at all"  . Drug use: No  . Sexual activity: Not Asked   Other Topics Concern  . None   Social History Narrative  . None   Additional Social History:   Sleep: Fair  Appetite:  Fair  Current Medications: Current Facility-Administered Medications  Medication Dose Route Frequency Provider Last Rate Last Dose  . acetaminophen (TYLENOL) tablet 650 mg  650 mg Oral Q6H PRN Patrecia Pour, NP      . alum & mag hydroxide-simeth (MAALOX/MYLANTA) 200-200-20 MG/5ML suspension 30 mL  30 mL Oral Q6H PRN Laverle Hobby, PA-C   30 mL at 08/19/16 0253  . hydrOXYzine (ATARAX/VISTARIL) tablet 25 mg  25 mg Oral Q6H PRN Patrecia Pour, NP   25 mg at 08/17/16 2107  . lamoTRIgine (LAMICTAL) tablet 25 mg  25 mg Oral Daily Saramma Eappen, MD   25 mg at 08/21/16 1026  . levothyroxine (SYNTHROID, LEVOTHROID) tablet 150 mcg  150 mcg Oral QAC breakfast Patrecia Pour, NP   150 mcg at 08/21/16 0604  . lidocaine (LIDODERM) 5 % 1 patch  1 patch Transdermal Q24H Rozetta Nunnery, NP   1 patch at 08/20/16 2234  . LORazepam (ATIVAN) tablet 1 mg  1 mg Oral Q6H PRN Ursula Alert, MD   1 mg at 08/20/16 2233  . magnesium hydroxide (MILK OF MAGNESIA) suspension  30 mL  30 mL Oral Daily PRN Patrecia Pour, NP      . nicotine (NICODERM CQ - dosed in mg/24 hours) patch 21 mg  21 mg Transdermal Daily Patrecia Pour, NP      . OLANZapine (ZYPREXA) tablet 10 mg  10 mg Oral TID PRN Ursula Alert, MD   10 mg at 08/20/16 2233   Or  . OLANZapine (ZYPREXA) injection 10 mg  10 mg Intramuscular TID PRN Saramma Eappen,  MD      . pantoprazole (PROTONIX) EC tablet 40 mg  40 mg Oral BID AC Ursula Alert, MD   40 mg at 08/21/16 0604  . QUEtiapine (SEROQUEL) tablet 12.5 mg  12.5 mg Oral QPC breakfast Ursula Alert, MD   12.5 mg at 08/21/16 0945  . QUEtiapine (SEROQUEL) tablet 12.5 mg  12.5 mg Oral QPC lunch Ursula Alert, MD   12.5 mg at 08/18/16 1201  . QUEtiapine (SEROQUEL) tablet 200 mg  200 mg Oral QHS Ursula Alert, MD   200 mg at 08/20/16 2233  . traZODone (DESYREL) tablet 100 mg  100 mg Oral QHS PRN Rozetta Nunnery, NP   100 mg at 08/20/16 2233    Lab Results:  Results for orders placed or performed during the hospital encounter of 08/17/16 (from the past 48 hour(s))  Comprehensive metabolic panel     Status: Abnormal   Collection Time: 08/20/16  3:05 PM  Result Value Ref Range   Sodium 128 (L) 135 - 145 mmol/L   Potassium 3.8 3.5 - 5.1 mmol/L   Chloride 95 (L) 101 - 111 mmol/L   CO2 23 22 - 32 mmol/L   Glucose, Bld 88 65 - 99 mg/dL   BUN 9 6 - 20 mg/dL   Creatinine, Ser 1.28 (H) 0.61 - 1.24 mg/dL   Calcium 8.6 (L) 8.9 - 10.3 mg/dL   Total Protein 6.0 (L) 6.5 - 8.1 g/dL   Albumin 3.8 3.5 - 5.0 g/dL   AST 44 (H) 15 - 41 U/L   ALT 31 17 - 63 U/L   Alkaline Phosphatase 46 38 - 126 U/L   Total Bilirubin 0.7 0.3 - 1.2 mg/dL   GFR calc non Af Amer 59 (L) >60 mL/min   GFR calc Af Amer >60 >60 mL/min    Comment: (NOTE) The eGFR has been calculated using the CKD EPI equation. This calculation has not been validated in all clinical situations. eGFR's persistently <60 mL/min signify possible Chronic Kidney Disease.    Anion gap 10 5 - 15  CBC WITH  DIFFERENTIAL     Status: Abnormal   Collection Time: 08/20/16  3:05 PM  Result Value Ref Range   WBC 14.0 (H) 4.0 - 10.5 K/uL   RBC 4.61 4.22 - 5.81 MIL/uL   Hemoglobin 13.8 13.0 - 17.0 g/dL   HCT 39.9 39.0 - 52.0 %   MCV 86.6 78.0 - 100.0 fL   MCH 29.9 26.0 - 34.0 pg   MCHC 34.6 30.0 - 36.0 g/dL   RDW 14.0 11.5 - 15.5 %   Platelets 212 150 - 400 K/uL   Neutrophils Relative % 71 %   Neutro Abs 9.9 (H) 1.7 - 7.7 K/uL   Lymphocytes Relative 18 %   Lymphs Abs 2.5 0.7 - 4.0 K/uL   Monocytes Relative 10 %   Monocytes Absolute 1.4 (H) 0.1 - 1.0 K/uL   Eosinophils Relative 1 %   Eosinophils Absolute 0.2 0.0 - 0.7 K/uL   Basophils Relative 0 %   Basophils Absolute 0.0 0.0 - 0.1 K/uL  Ethanol/ETOH     Status: None   Collection Time: 08/20/16  3:05 PM  Result Value Ref Range   Alcohol, Ethyl (B) <5 <5 mg/dL    Comment:        LOWEST DETECTABLE LIMIT FOR SERUM ALCOHOL IS 5 mg/dL FOR MEDICAL PURPOSES ONLY   Urine rapid drug screen (hosp performed)not at [facility]     Status: None   Collection  Time: 08/20/16  3:25 PM  Result Value Ref Range   Opiates NONE DETECTED NONE DETECTED   Cocaine NONE DETECTED NONE DETECTED   Benzodiazepines NONE DETECTED NONE DETECTED   Amphetamines NONE DETECTED NONE DETECTED   Tetrahydrocannabinol NONE DETECTED NONE DETECTED   Barbiturates NONE DETECTED NONE DETECTED    Comment:        DRUG SCREEN FOR MEDICAL PURPOSES ONLY.  IF CONFIRMATION IS NEEDED FOR ANY PURPOSE, NOTIFY LAB WITHIN 5 DAYS.        LOWEST DETECTABLE LIMITS FOR URINE DRUG SCREEN Drug Class       Cutoff (ng/mL) Amphetamine      1000 Barbiturate      200 Benzodiazepine   619 Tricyclics       509 Opiates          300 Cocaine          300 THC              50     Blood Alcohol level:  Lab Results  Component Value Date   ETH <5 08/20/2016   ETH <5 32/67/1245    Metabolic Disorder Labs: No results found for: HGBA1C, MPG No results found for: PROLACTIN No results found for: CHOL,  TRIG, HDL, CHOLHDL, VLDL, LDLCALC  Physical Findings: AIMS: Facial and Oral Movements Muscles of Facial Expression: None, normal Lips and Perioral Area: None, normal Jaw: None, normal Tongue: None, normal,Extremity Movements Upper (arms, wrists, hands, fingers): None, normal Lower (legs, knees, ankles, toes): None, normal, Trunk Movements Neck, shoulders, hips: None, normal, Overall Severity Severity of abnormal movements (highest score from questions above): None, normal Incapacitation due to abnormal movements: None, normal Patient's awareness of abnormal movements (rate only patient's report): No Awareness, Dental Status Current problems with teeth and/or dentures?: Yes Does patient usually wear dentures?: Yes (Not with pt at present.)  CIWA:  CIWA-Ar Total: 7 COWS:     Musculoskeletal: Strength & Muscle Tone: within normal limits Gait & Station: normal Patient leans: N/A  Psychiatric Specialty Exam: Physical Exam  Nursing note and vitals reviewed.   ROS  no headache, no chest pain , no shortness of breath   Blood pressure 126/89, pulse 97, temperature 98.5 F (36.9 C), temperature source Oral, resp. rate 18, height 5' 5" (1.651 m), weight 74.8 kg (165 lb), SpO2 99 %.Body mass index is 27.46 kg/m.  General Appearance: Fairly Groomed  Eye Contact:  Fair  Speech:  Pressured  Volume:  Increased  Mood:  Euthymic and reports he is feeling well, denies depression at this time  Affect:  Euthymic and Improved  Thought Process:  Linear and Descriptions of Associations: Tangential  Orientation:  Full (Time, Place, and Person)  Thought Content:  denies hallucinations, no delusions, not internally preoccupied   Suicidal Thoughts:  No denies any suicidal or self injurious ideations , denies any homicidal or violent ideations   Homicidal Thoughts:  No  Memory:  Recent and remote grossly intact   Judgement:  Fair  Insight:  Fair  Psychomotor Activity:  pacing on unit at times    Concentration:  Concentration: Fair and Attention Span: Fair  Recall:  [employer] of Knowledge:  Fair  Language:  Good  Akathisia:  Negative  Handed:  Right  AIMS (if indicated):     Assets:  Communication Skills Desire for Improvement Resilience  ADL's:  Improving   Cognition:  WNL  Sleep:  Number of Hours: 6.25    Assessment - patient remains  pressured in speech, somewhat disorganized in thought process, and sleep remains poor. He is , however, partially improved compared to yesterday. Thus far tolerating medications well, denies side effects.   Treatment Plan Summary: Continue to encourage group, milieu participation to work on coping skills and symptom reduction Increase Seroquel to 25 mgrs BID and 250 mgrs QHS for mood disorder  Continue Lamictal 25 mgrs QDAY for mood disorder  Continue Ativan 1 mgr Q 6 hours PRN for anxiety or potential symptoms of WDL . Treatment team working on disposition planning options.  Hyponatremia- Increase intake of free water and gatorade. Limit physical activity. Advised nurse patient needs to limit physical exertion.   Nanci Pina, FNP  08/21/2016, 12:32 PM

## 2016-08-22 MED ORDER — KETOROLAC TROMETHAMINE 30 MG/ML IJ SOLN
30.0000 mg | Freq: Once | INTRAMUSCULAR | Status: AC
  Filled 2016-08-22: qty 1

## 2016-08-22 MED ORDER — KETOROLAC TROMETHAMINE 30 MG/ML IJ SOLN
INTRAMUSCULAR | Status: AC
  Administered 2016-08-22: 30 mg
  Filled 2016-08-22: qty 1

## 2016-08-22 NOTE — [facility] Group Notes (Signed)
[facility] Group Notes:  (Clinical Social Work)  08/22/2016  10:00AM-11:00AM  Summary of Progress/Problems:  The main focus of today's process group was to listen to a variety of genres of music and to identify that different types of music provoke different responses.  The patient then was able to identify personally what was soothing for them, as well as energizing, as well as how patient can personally use this knowledge in sleep habits, with depression, and with other symptoms.  The patient expressed at the beginning of group the overall feeling of "good" and said he is always in a great mood.  He stated he loves music, knew much of the music, and obviously enjoyed the hour a great deal.  He showed more insight into his behavior than previously.  Type of Therapy:  Music Therapy   Participation Level:  Active  Participation Quality:  Attentive and Sharing  Affect:  Appropriate  Cognitive:  Disorganized  Insight:  Improving  Engagement in Therapy:  Engaged  Modes of Intervention:   Activity, Exploration  Selmer Dominion, LCSW 08/22/2016

## 2016-08-22 NOTE — Progress Notes (Signed)
[facility] MD Progress Note  08/22/2016 2:55 PM [name] [name]  MRN:  [id number] Subjective:  "I'm ok.  My mother said she needed help." Objective:  Patient is a 61 year old who has been diagnosed with Bipolar Disorder, admitted under commitment initiated by family.  Same observation, pressured speech, disorganized thoughts and hard for this NP to follow, talking about father going to an [employer] and teaching band, brother living with their mother. Patient is observed in hallways and talks to staff and other patients, intrusive at times.  Insight is limited, and states he is not suicidal nor does he have Bipolar 1.      Vitals:   08/22/16 0600 08/22/16 0601  BP: 110/76 105/71  Pulse: 77 85  Resp: 16   Temp: 98.1 F (36.7 C)       Principal Problem: Bipolar disorder, current episode manic severe with psychotic features ([place]) Diagnosis:   Patient Active Problem List   Diagnosis Date Noted  . Hyponatremia [E87.1] 08/18/2016  . Alcohol use disorder, moderate, dependence ([place]) [F10.20] 08/18/2016  . Bipolar disorder, current episode manic severe with psychotic features ([place]) [F31.2] 08/18/2016  . Bipolar affective disorder ([place]) [F31.9] 08/16/2016  . Carpal tunnel syndrome on right [G56.01] 02/04/2016  . Umbilical hernia [K56.2] 12/26/2012  . Chest pain [R07.9] 03/31/2012  . GERD (gastroesophageal reflux disease) [K21.9] 03/06/2012  . Hypothyroidism [E03.9] 03/06/2012  . COPD (chronic obstructive pulmonary disease) ([place]) [J44.9] 03/06/2012   Total Time spent with patient: 20 minutes  Past Psychiatric History: see HPI  Past Medical History:  Past Medical History:  Diagnosis Date  . Anxiety   . Arthritis   . Asthma   . Bipolar 1 disorder ([place])   . Carpal tunnel syndrome on right 02/04/2016  . Chronic insomnia   . COPD (chronic obstructive pulmonary disease) ([place])   . Depression   . Diaphragmatic hernia   . Dizziness   . ED (erectile dysfunction)   . GERD (gastroesophageal  reflux disease)   . HLD (hyperlipidemia)   . Hypothyroidism   . SOB (shortness of breath)   . Tobacco use   . Umbilical hernia without obstruction and without gangrene     Past Surgical History:  Procedure Laterality Date  . COLONOSCOPY    . INSERTION OF MESH N/A 01/01/2013   Procedure: INSERTION OF MESH;  Surgeon: Merrie Roof, MD;  Location: [place];  Service: General;  Laterality: N/A;  . UMBILICAL HERNIA REPAIR N/A 01/01/2013   Procedure: HERNIA REPAIR UMBILICAL ADULT;  Surgeon: Merrie Roof, MD;  Location: [place];  Service: General;  Laterality: N/A;  . UPPER GI ENDOSCOPY     Family History:  Family History  Problem Relation Age of Onset  . Arrhythmia    . ALS    . Prostate cancer Maternal Grandfather   . Colon cancer Paternal Grandfather   . Heart disease Mother   . Arthritis Mother   . Mental illness Brother    Family Psychiatric  History: see HPI Social History:  History  Alcohol Use  . Yes    Comment: "Never, hardly one at all"     History  Drug Use No    Social History   Social History  . Marital status: Single    Spouse name: N/A  . Number of children: 0  . Years of education: N/A   Occupational History  . disabled    Social History Main Topics  . Smoking status: Current Every  Day Smoker    Packs/day: 2.00    Years: 40.00    Types: Cigarettes  . Smokeless tobacco: Never Used  . Alcohol use Yes     Comment: "Never, hardly one at all"  . Drug use: No  . Sexual activity: Not Asked   Other Topics Concern  . None   Social History Narrative  . None   Additional Social History:   Sleep: Fair  Appetite:  Fair  Current Medications: Current Facility-Administered Medications  Medication Dose Route Frequency Provider Last Rate Last Dose  . acetaminophen (TYLENOL) tablet 650 mg  650 mg Oral Q6H PRN Patrecia Pour, NP      . alum & mag hydroxide-simeth (MAALOX/MYLANTA) 200-200-20 MG/5ML suspension 30 mL  30 mL Oral Q6H PRN Laverle Hobby, PA-C    30 mL at 08/19/16 0253  . hydrOXYzine (ATARAX/VISTARIL) tablet 25 mg  25 mg Oral Q6H PRN Patrecia Pour, NP   25 mg at 08/17/16 2107  . lamoTRIgine (LAMICTAL) tablet 25 mg  25 mg Oral Daily Ursula Alert, MD   25 mg at 08/22/16 0814  . levothyroxine (SYNTHROID, LEVOTHROID) tablet 150 mcg  150 mcg Oral QAC breakfast Patrecia Pour, NP   150 mcg at 08/22/16 5993  . lidocaine (LIDODERM) 5 % 1 patch  1 patch Transdermal Q24H Rozetta Nunnery, NP   1 patch at 08/20/16 2234  . LORazepam (ATIVAN) tablet 1 mg  1 mg Oral Q6H PRN Ursula Alert, MD   1 mg at 08/20/16 2233  . magnesium hydroxide (MILK OF MAGNESIA) suspension 30 mL  30 mL Oral Daily PRN Patrecia Pour, NP      . nicotine (NICODERM CQ - dosed in mg/24 hours) patch 21 mg  21 mg Transdermal Daily Patrecia Pour, NP      . OLANZapine (ZYPREXA) tablet 10 mg  10 mg Oral TID PRN Ursula Alert, MD   10 mg at 08/22/16 0132   Or  . OLANZapine (ZYPREXA) injection 10 mg  10 mg Intramuscular TID PRN Ursula Alert, MD      . pantoprazole (PROTONIX) EC tablet 40 mg  40 mg Oral BID AC Ursula Alert, MD   40 mg at 08/22/16 5701  . QUEtiapine (SEROQUEL) tablet 12.5 mg  12.5 mg Oral QPC breakfast Ursula Alert, MD   12.5 mg at 08/22/16 0814  . QUEtiapine (SEROQUEL) tablet 12.5 mg  12.5 mg Oral QPC lunch Saramma Eappen, MD   12.5 mg at 08/22/16 1258  . QUEtiapine (SEROQUEL) tablet 200 mg  200 mg Oral QHS Ursula Alert, MD   200 mg at 08/21/16 2302  . traZODone (DESYREL) tablet 100 mg  100 mg Oral QHS PRN Rozetta Nunnery, NP   100 mg at 08/21/16 2302    Lab Results:  Results for orders placed or performed during the hospital encounter of 08/17/16 (from the past 48 hour(s))  Comprehensive metabolic panel     Status: Abnormal   Collection Time: 08/20/16  3:05 PM  Result Value Ref Range   Sodium 128 (L) 135 - 145 mmol/L   Potassium 3.8 3.5 - 5.1 mmol/L   Chloride 95 (L) 101 - 111 mmol/L   CO2 23 22 - 32 mmol/L   Glucose, Bld 88 65 - 99 mg/dL   BUN 9 6 -  20 mg/dL   Creatinine, Ser 1.28 (H) 0.61 - 1.24 mg/dL   Calcium 8.6 (L) 8.9 - 10.3 mg/dL   Total Protein 6.0 (L) 6.5 -  8.1 g/dL   Albumin 3.8 3.5 - 5.0 g/dL   AST 44 (H) 15 - 41 U/L   ALT 31 17 - 63 U/L   Alkaline Phosphatase 46 38 - 126 U/L   Total Bilirubin 0.7 0.3 - 1.2 mg/dL   GFR calc non Af Amer 59 (L) >60 mL/min   GFR calc Af Amer >60 >60 mL/min    Comment: (NOTE) The eGFR has been calculated using the CKD EPI equation. This calculation has not been validated in all clinical situations. eGFR's persistently <60 mL/min signify possible Chronic Kidney Disease.    Anion gap 10 5 - 15  CBC WITH DIFFERENTIAL     Status: Abnormal   Collection Time: 08/20/16  3:05 PM  Result Value Ref Range   WBC 14.0 (H) 4.0 - 10.5 K/uL   RBC 4.61 4.22 - 5.81 MIL/uL   Hemoglobin 13.8 13.0 - 17.0 g/dL   HCT 39.9 39.0 - 52.0 %   MCV 86.6 78.0 - 100.0 fL   MCH 29.9 26.0 - 34.0 pg   MCHC 34.6 30.0 - 36.0 g/dL   RDW 14.0 11.5 - 15.5 %   Platelets 212 150 - 400 K/uL   Neutrophils Relative % 71 %   Neutro Abs 9.9 (H) 1.7 - 7.7 K/uL   Lymphocytes Relative 18 %   Lymphs Abs 2.5 0.7 - 4.0 K/uL   Monocytes Relative 10 %   Monocytes Absolute 1.4 (H) 0.1 - 1.0 K/uL   Eosinophils Relative 1 %   Eosinophils Absolute 0.2 0.0 - 0.7 K/uL   Basophils Relative 0 %   Basophils Absolute 0.0 0.0 - 0.1 K/uL  Ethanol/ETOH     Status: None   Collection Time: 08/20/16  3:05 PM  Result Value Ref Range   Alcohol, Ethyl (B) <5 <5 mg/dL    Comment:        LOWEST DETECTABLE LIMIT FOR SERUM ALCOHOL IS 5 mg/dL FOR MEDICAL PURPOSES ONLY   Urine rapid drug screen (hosp performed)not at [facility]     Status: None   Collection Time: 08/20/16  3:25 PM  Result Value Ref Range   Opiates NONE DETECTED NONE DETECTED   Cocaine NONE DETECTED NONE DETECTED   Benzodiazepines NONE DETECTED NONE DETECTED   Amphetamines NONE DETECTED NONE DETECTED   Tetrahydrocannabinol NONE DETECTED NONE DETECTED   Barbiturates NONE DETECTED NONE  DETECTED    Comment:        DRUG SCREEN FOR MEDICAL PURPOSES ONLY.  IF CONFIRMATION IS NEEDED FOR ANY PURPOSE, NOTIFY LAB WITHIN 5 DAYS.        LOWEST DETECTABLE LIMITS FOR URINE DRUG SCREEN Drug Class       Cutoff (ng/mL) Amphetamine      1000 Barbiturate      200 Benzodiazepine   409 Tricyclics       811 Opiates          300 Cocaine          300 THC              50     Blood Alcohol level:  Lab Results  Component Value Date   ETH <5 08/20/2016   ETH <5 91/47/8295    Metabolic Disorder Labs: No results found for: HGBA1C, MPG No results found for: PROLACTIN No results found for: CHOL, TRIG, HDL, CHOLHDL, VLDL, LDLCALC  Physical Findings: AIMS: Facial and Oral Movements Muscles of Facial Expression: None, normal Lips and Perioral Area: None, normal Jaw: None, normal Tongue: None,  normal,Extremity Movements Upper (arms, wrists, hands, fingers): None, normal Lower (legs, knees, ankles, toes): None, normal, Trunk Movements Neck, shoulders, hips: None, normal, Overall Severity Severity of abnormal movements (highest score from questions above): None, normal Incapacitation due to abnormal movements: None, normal Patient's awareness of abnormal movements (rate only patient's report): No Awareness, Dental Status Current problems with teeth and/or dentures?: No Does patient usually wear dentures?: Yes  CIWA:  CIWA-Ar Total: 7 COWS:     Musculoskeletal: Strength & Muscle Tone: within normal limits Gait & Station: normal Patient leans: N/A  Psychiatric Specialty Exam: Physical Exam  Nursing note and vitals reviewed.   ROS no headache, no chest pain , no shortness of breath   Blood pressure 105/71, pulse 85, temperature 98.1 F (36.7 C), temperature source Oral, resp. rate 16, height 5' 5"  (1.651 m), weight 74.8 kg (165 lb), SpO2 99 %.Body mass index is 27.46 kg/m.  General Appearance: Fairly Groomed  Eye Contact:  Fair  Speech:  Pressured  Volume:  Increased   Mood:  Euthymic and reports he is feeling well, denies depression at this time  Affect:  Euthymic and Improved  Thought Process:  Linear and Descriptions of Associations: Tangential  Orientation:  Full (Time, Place, and Person)  Thought Content:  denies hallucinations, no delusions, not internally preoccupied   Suicidal Thoughts:  No denies  Homicidal Thoughts:  No denies  Memory:  Recent and remote grossly intact   Judgement:  Fair  Insight:  Fair  Psychomotor Activity:  pacing on unit at times   Concentration:  Concentration: Fair and Attention Span: Fair  Recall:  [employer] of Knowledge:  Fair  Language:  Good  Akathisia:  Negative  Handed:  Right  AIMS (if indicated):     Assets:  Communication Skills Desire for Improvement Resilience  ADL's:  Improving   Cognition:  WNL  Sleep:  Number of Hours: 2    Assessment - patient remains pressured in speech, somewhat disorganized in thought process, and sleep remains poor.  Patient's mood partially improved. Thus far tolerating medications well, denies side effects.   Treatment Plan Summary reviewed 08/22/2016: Continue to encourage group, milieu participation to work on coping skills and symptom reduction Increase Seroquel to 25 mgrs BID and 250 mgrs QHS for mood disorder  Continue Lamictal 25 mgrs QDAY for mood disorder  Continue Ativan 1 mgr Q 6 hours PRN for anxiety or potential symptoms of WDL . Treatment team working on disposition planning options.  Hyponatremia- Increase intake of free water and gatorade. Limit physical activity. Advised nurse patient needs to limit physical exertion.  Patient states that he took "salt yesterday."   Janett Labella, NP [facility] 08/22/2016, 2:55 PM

## 2016-08-22 NOTE — BHH Group Notes (Signed)
[facility] Group Notes:  (Nursing/MHT/Case Management/Adjunct)  Date:  08/22/2016  Time:  2:53 PM  Type of Therapy:  Nurse Education  Participation Level:  Active  Participation Quality:  Attentive and Redirectable  Affect:  Excited  Cognitive:  Disorganized  Insight:  Improving  Engagement in Group:  Engaged and Supportive  Modes of Intervention:  Discussion, Education and Socialization  Summary of Progress/Problems: Pt actively participated in group activity related to self esteem, focusing on the positive about themselves,and peers.  Alison Murray 08/22/2016, 2:53 PM

## 2016-08-22 NOTE — Progress Notes (Signed)
Patient ID: [name] [name], male   DOB: [birth year], 61 y.o.   MRN: [id number] D) Pt has been hyperverbal, tangential, with pressured speech. 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 Pt frequently pacing hallway although he declined to go to the gym due to "bad knee". Pt has been positive for groups with minimal prompting. Denies s.I. Or avh. A) level 3 obs for safety, support and encouragement provided. Redirection and limit setting as needed. Med ed reinforced. R) Cooperative on approach.

## 2016-08-22 NOTE — Progress Notes (Signed)
Adult Psychoeducational Group Note  Date:  08/22/2016 Time:  9:24 PM  Group Topic/Focus:  Wrap-Up Group:   The focus of this group is to help patients review their daily goal of treatment and discuss progress on daily workbooks.  Participation Level:  Active  Participation Quality:  Appropriate  Affect:  Appropriate  Cognitive:  Alert  Insight: Appropriate  Engagement in Group:  Engaged  Modes of Intervention:  Discussion  Additional Comments:  Patient's goal for today was to go home.  Jazlen Ogarro L Yarielys Beed 08/22/2016, 9:24 PM

## 2016-08-22 NOTE — Progress Notes (Signed)
D: Pt continues to be loud, manic and delusional. Pt at the time of assessment denied SI, HI, depression, anxiety or AVH. Pt however, complained of severe L. knee pain. Pt remained restless and hyper-verbal.  A: Medications offered as prescribed. Support, encouragement, and safe environment provided. 15-minute safety checks continue. R: Pt refused his lidocaine patch; states, "that will not do anything for my pain; what I need is the injection I got yesterday." Pt did not attend wrap-up group. Safety checks continue.

## 2016-08-22 NOTE — Progress Notes (Signed)
D.  Pt has been out and walking in hall before bed, complaint of left knee pain continues. Pt asked if he is being "held" here, IVC explained to patient.  Pt does not feel he needs to be here.  Pt states "there's nothing wrong with me".  Pt continues to feel his mother and brother did this to him.  Pt was positive for evening AA group, intrusive at times with peers.  Pt denies SI/HI/hallucinations at this time.  A.  Support and encouragement offered, medication given as ordered  R.  Pt remains safe on the unit, will continue to monitor.

## 2016-08-23 DIAGNOSIS — R569 Unspecified convulsions: Secondary | ICD-10-CM

## 2016-08-23 MED ORDER — QUETIAPINE FUMARATE 100 MG PO TABS
250.0000 mg | ORAL_TABLET | Freq: Every day | ORAL | Status: DC
  Administered 2016-08-23: 250 mg via ORAL
  Filled 2016-08-23 (×2): qty 2.5

## 2016-08-23 MED ORDER — LAMOTRIGINE 25 MG PO TABS
25.0000 mg | ORAL_TABLET | Freq: Two times a day (BID) | ORAL | Status: DC
  Administered 2016-08-23 – 2016-08-27 (×8): 25 mg via ORAL
  Filled 2016-08-23 (×12): qty 1

## 2016-08-23 NOTE — Tx Team (Signed)
Interdisciplinary Treatment and Diagnostic Plan Update  08/23/2016 Time of Session: 8:44 AM  [name] [name] MRN: [id number]  Principal Diagnosis: Bipolar disorder, current episode manic severe with psychotic features ([place])  Secondary Diagnoses: Principal Problem:   Bipolar disorder, current episode manic severe with psychotic features ([place]) Active Problems:   Hypothyroidism   Hyponatremia   Alcohol use disorder, moderate, dependence ([place])   Current Medications:  Current Facility-Administered Medications  Medication Dose Route Frequency Provider Last Rate Last Dose  . acetaminophen (TYLENOL) tablet 650 mg  650 mg Oral Q6H PRN Patrecia Pour, NP      . alum & mag hydroxide-simeth (MAALOX/MYLANTA) 200-200-20 MG/5ML suspension 30 mL  30 mL Oral Q6H PRN Laverle Hobby, PA-C   30 mL at 08/19/16 0253  . hydrOXYzine (ATARAX/VISTARIL) tablet 25 mg  25 mg Oral Q6H PRN Patrecia Pour, NP   25 mg at 08/17/16 2107  . lamoTRIgine (LAMICTAL) tablet 25 mg  25 mg Oral Daily Ursula Alert, MD   25 mg at 08/23/16 0817  . levothyroxine (SYNTHROID, LEVOTHROID) tablet 150 mcg  150 mcg Oral QAC breakfast Patrecia Pour, NP   150 mcg at 08/23/16 0610  . lidocaine (LIDODERM) 5 % 1 patch  1 patch Transdermal Q24H Rozetta Nunnery, NP   1 patch at 08/22/16 2119  . LORazepam (ATIVAN) tablet 1 mg  1 mg Oral Q6H PRN Ursula Alert, MD   1 mg at 08/22/16 2210  . magnesium hydroxide (MILK OF MAGNESIA) suspension 30 mL  30 mL Oral Daily PRN Patrecia Pour, NP      . nicotine (NICODERM CQ - dosed in mg/24 hours) patch 21 mg  21 mg Transdermal Daily Patrecia Pour, NP      . OLANZapine (ZYPREXA) tablet 10 mg  10 mg Oral TID PRN Ursula Alert, MD   10 mg at 08/22/16 2210   Or  . OLANZapine (ZYPREXA) injection 10 mg  10 mg Intramuscular TID PRN Ursula Alert, MD      . pantoprazole (PROTONIX) EC tablet 40 mg  40 mg Oral BID AC Saramma Eappen, MD   40 mg at 08/23/16 0610  . QUEtiapine (SEROQUEL) tablet 12.5 mg  12.5 mg  Oral QPC breakfast Ursula Alert, MD   12.5 mg at 08/23/16 0819  . QUEtiapine (SEROQUEL) tablet 12.5 mg  12.5 mg Oral QPC lunch Saramma Eappen, MD   12.5 mg at 08/22/16 1258  . QUEtiapine (SEROQUEL) tablet 200 mg  200 mg Oral QHS Saramma Eappen, MD   200 mg at 08/22/16 2210  . traZODone (DESYREL) tablet 100 mg  100 mg Oral QHS PRN Rozetta Nunnery, NP   100 mg at 08/22/16 2210    PTA Medications: Prescriptions Prior to Admission  Medication Sig Dispense Refill Last Dose  . albuterol (PROVENTIL HFA;VENTOLIN HFA) 108 (90 Base) MCG/ACT inhaler Inhale 1-2 puffs into the lungs every 4 (four) hours as needed for wheezing or shortness of breath. (Patient not taking: Reported on 08/15/2016) 1 Inhaler 2 Not Taking at Unknown time  . clonazePAM (KLONOPIN) 1 MG tablet TAKE 1 TABLET BY MOUTH 3 TIMES DAILY AS NEEDED FOR ANXIETY  5   . dexamethasone (DECADRON) 4 MG tablet Take 3 tablets (12 mg total) by mouth once. 12mg  PO on 07/17/15. (Patient not taking: Reported on 08/15/2016) 3 tablet 0 Not Taking at Unknown time  . diphenhydrAMINE (BENADRYL) 25 MG tablet Take 2 tablets (50 mg total) by mouth every 4 (four) hours as needed  for itching. (Patient not taking: Reported on 08/15/2016) 20 tablet 0 Not Taking at Unknown time  . divalproex (DEPAKOTE) 250 MG DR tablet Take 750 mg by mouth at bedtime.  2 Not Taking at Unknown time  . esomeprazole (NEXIUM) 40 MG capsule Take 40 mg by mouth daily at 12 noon.   08/15/2016 at Unknown time  . famotidine (PEPCID) 20 MG tablet Take 1 tablet (20 mg total) by mouth 2 (two) times daily. (Patient not taking: Reported on 08/15/2016) 30 tablet 0 Not Taking at Unknown time  . fluticasone (FLONASE) 50 MCG/ACT nasal spray Place 2 sprays into both nostrils daily.  0 Not Taking at Unknown time  . hydrocortisone 2.5 % lotion Apply topically 2 (two) times daily. (Patient not taking: Reported on 08/15/2016) 59 mL 0 Not Taking at Unknown time  . ipratropium-albuterol (DUONEB) 0.5-2.5 (3) MG/3ML SOLN Take  3 mLs by nebulization every 4 (four) hours as needed for wheezing.  3 Not Taking at Unknown time  . levothyroxine (SYNTHROID, LEVOTHROID) 150 MCG tablet Take 150 mcg by mouth every morning.    unknown  . predniSONE (DELTASONE) 20 MG tablet Take 2 tablets (40 mg total) by mouth daily. (Patient not taking: Reported on 08/15/2016) 10 tablet 0 Not Taking at Unknown time  . QUEtiapine (SEROQUEL) 100 MG tablet Take 100 mg by mouth at bedtime.  2 08/14/2016 at Unknown time  . risperiDONE (RISPERDAL) 2 MG tablet Take 2 mg by mouth at bedtime.    Not Taking at Unknown time  . traZODone (DESYREL) 100 MG tablet Take 100 mg by mouth at bedtime.   Not Taking at Unknown time    Treatment Modalities: Medication Management, Group therapy, Case management,  1 to 1 session with clinician, Psychoeducation, Recreational therapy.   Physician Treatment Plan for Primary Diagnosis: Bipolar disorder, current episode manic severe with psychotic features ([place]) Long Term Goal(s): Improvement in symptoms so as ready for discharge  Short Term Goals: Ability to demonstrate self-control will improve and Ability to identify triggers associated with substance abuse/mental health issues will improve  Medication Management: Evaluate patient's response, side effects, and tolerance of medication regimen.  Therapeutic Interventions: 1 to 1 sessions, Unit Group sessions and Medication administration.  Evaluation of Outcomes: Progressing   4/16: Pt continues to present with pressured, tangential speech and delusional thoughts.  Angry with family and threatening to "get back at my brother for putting my mother against me."   Increase Seroquel to 25 mgrs BID and 250 mgrs QHS for mood disorder  Continue Lamictal 25 mgrs QDAY for mood disorder   Physician Treatment Plan for Secondary Diagnosis: Principal Problem:   Bipolar disorder, current episode manic severe with psychotic features ([place]) Active Problems:   Hypothyroidism    Hyponatremia   Alcohol use disorder, moderate, dependence ([place])   Long Term Goal(s): Improvement in symptoms so as ready for discharge  Short Term Goals: Compliance with prescribed medications will improve  Medication Management: Evaluate patient's response, side effects, and tolerance of medication regimen.  Therapeutic Interventions: 1 to 1 sessions, Unit Group sessions and Medication administration.  Evaluation of Outcomes: Progressing   RN Treatment Plan for Primary Diagnosis: Bipolar disorder, current episode manic severe with psychotic features ([place]) Long Term Goal(s): Knowledge of disease and therapeutic regimen to maintain health will improve  Short Term Goals: Ability to verbalize frustration and anger appropriately will improve and Compliance with prescribed medications will improve  Medication Management: RN will administer medications as ordered by provider, will assess  and evaluate patient's response and provide education to patient for prescribed medication. RN will report any adverse and/or side effects to prescribing provider.  Therapeutic Interventions: 1 on 1 counseling sessions, Psychoeducation, Medication administration, Evaluate responses to treatment, Monitor vital signs and CBGs as ordered, Perform/monitor CIWA, COWS, AIMS and Fall Risk screenings as ordered, Perform wound care treatments as ordered.  Evaluation of Outcomes: Progressing   LCSW Treatment Plan for Primary Diagnosis: Bipolar disorder, current episode manic severe with psychotic features ([place]) Long Term Goal(s): Safe transition to appropriate next level of care at discharge, Engage patient in therapeutic group addressing interpersonal concerns.  Short Term Goals: Engage patient in aftercare planning with referrals and resources and Increase skills for wellness and recovery  Therapeutic Interventions: Assess for all discharge needs, 1 to 1 time with Social worker, Explore available resources and  support systems, Assess for adequacy in community support network, Educate family and significant other(s) on suicide prevention, Complete Psychosocial Assessment, Interpersonal group therapy.  Evaluation of Outcomes: Progressing   Progress in Treatment: Attending groups: Yes Participating in groups: Yes Taking medication as prescribed: Yes, MD continues to assess for medication changes as needed Toleration medication: Yes, no side effects reported at this time Family/Significant other contact made: No, patient declined.  Patient understands diagnosis: Limited insight  Discussing patient identified problems/goals with staff: Yes Medical problems stabilized or resolved: Yes Denies suicidal/homicidal ideation: Yes  Issues/concerns per patient self-inventory: None Other: N/A  New problem(s) identified: None identified at this time.   New Short Term/Long Term Goal(s): None identified at this time.   Discharge Plan or Barriers: [facility] up with [facility]   Reason for Continuation of Hospitalization: Delusions  Mania Medication stabilization   Estimated Length of Stay: 3-5 days  Attendees: Patient: 08/23/2016  8:44 AM  Physician: Dr. Shea Evans 08/23/2016  8:44 AM  Nursing: Jeanie Cooks , RN  08/23/2016  8:44 AM  RN Care Manager: Lars Pinks 08/23/2016  8:44 AM  Social Worker: Ripley Fraise, [place] 08/23/2016  8:44 AM  Recreational Therapist: Winfield Cunas 08/23/2016  8:44 AM  Other: Radonna Ricker, Social Work Intern  08/23/2016  8:44 AM  Other:  08/23/2016  8:44 AM  Other: 08/23/2016  8:44 AM    Scribe for Treatment Team: Ripley Fraise 08/23/2016 8:44 AM

## 2016-08-23 NOTE — [facility] Group Notes (Signed)
[facility] LCSW Group Therapy  08/23/2016 1:15pm  Type of Therapy:  Group Therapy vercoming Obstacles  Participation Level:  Active  Participation Quality:  Off-topic  Affect:  Appropriate  Cognitive:  Appropriate and Oriented  Insight:  Limited  Engagement in Therapy:  Improving  Modes of Intervention:  Discussion, Exploration, Problem-solving and Support  Description of Group:   In this group patients will be encouraged to explore what they see as obstacles to their own wellness and recovery. They will be guided to discuss their thoughts, feelings, and behaviors related to these obstacles. The group will process together ways to cope with barriers, with attention given to specific choices patients can make. Each patient will be challenged to identify changes they are motivated to make in order to overcome their obstacles. This group will be process-oriented, with patients participating in exploration of their own experiences as well as giving and receiving support and challenge from other group members.  Summary of Patient Progress: Pt is focused on his discharge plan, specifically what time of transportation he will use to get to his home. Pt insight is limited AEB his difficulty remaining on topic during group discussion.   Therapeutic Modalities:   Cognitive Behavioral Therapy Solution Focused Therapy Motivational Interviewing Relapse Prevention Therapy   Adriana Reams, LCSW 08/23/2016 3:13 PM

## 2016-08-23 NOTE — Progress Notes (Signed)
Pt presents animated and anxious on approach. Denies SI, HI, AVH and pain when assessed. Remains tangential with pressured speech. Intrusive and hyperactive on interactions with peers and staff. Verbally redirectable thus far. Per pt "I just want to go go home because my brother is all up in my stuff, I slept good last night nurse, I had a nice lady nurse last night and she gave me my medicine right". Rates his depression, hopelessness and anxiety all 0/10 on self inventory sheet.  A: Medications administered as prescribed, effects monitored. Encouraged pt to voice concerns, comply with treatment regimen including groups. Support and availability provided to pt throughout this shift. Q 15 minutes checks remains effective without outburst or self harm gestures to note thus far.  R: Pt took his medications when offered. Denies adverse drug reactions when assessed. Pt attended unit group as scheduled. Off unit for meals and activities with peers, returned without issues. Tolerated all PO intake well. POC continues for safety and mood stability, pt monitored as such.

## 2016-08-23 NOTE — Progress Notes (Signed)
Recreation Therapy Notes  Date: 08/23/16 Time: 1000 Location: [address] Dayroom  Group Topic: Coping Skills  Goal Area(s) Addresses:  Patient will be able to identify positive coping skills. Patient will be able to identify the benefits of coping skills. Patient will be able to identify benefits of using of coping skills post d/c.  Behavioral Response: None  Intervention: Scissors, magazines, glue sticks, coping skills sheet and Architect paper  Activity: Museum/gallery conservator.  Patients were to identify coping skills that could be used for diversions, socially, cognitively, tension releasers and physically.  Patients were to cut out pictures from magazines to represent their coping skills for each category.  Education: Radiographer, therapeutic, Dentist.   Education Outcome: Acknowledges understanding/In group clarification offered/Needs additional education.   Clinical Observations/Feedback: Pt was distracted and unable to focus.  Pt kept trying to make phone calls and had to be redirected.  Pt was focused on leaving stating he had somewhere to go, he just needs to leave here.   [name], LRT/CTRS         [name] A 08/23/2016 12:31 PM

## 2016-08-23 NOTE — Progress Notes (Signed)
[facility] MD Progress Note  08/23/2016 12:34 PM LINSEY HIROTA  MRN:  [id number] Subjective:  Patient states " I am all good . They are telling you different things that are not true.  Objective:  Patient seen and chart reviewed.Discussed patient with treatment team.  Pt this AM seen as pressured , tangential , intrusive , has vague irritability , needs redirection often. Pt continues to go back and forth with his plan , says he has a friend , does not clearly understand the need for medical clearance for seizures. Pt refused EEG offered to him. Pt per staff , continues to be grandiose and delusional, states he has a lot of property and money and also staff felt he still continues to get back at his brother . However , when asked specifically about his feelings towards his brother - he reports he wants to stay away from him. Continue to need medication changes and support.     Vitals:   08/23/16 0700 08/23/16 0701  BP: 126/86 (!) 133/91  Pulse: 92 98  Resp: 16   Temp: 98 F (36.7 C)       Principal Problem: Bipolar disorder, current episode manic severe with psychotic features ([place]) Diagnosis:   Patient Active Problem List   Diagnosis Date Noted  . Seizure-like activity ([place]) [R56.9] 08/23/2016  . Hyponatremia [E87.1] 08/18/2016  . Alcohol use disorder, moderate, dependence ([place]) [F10.20] 08/18/2016  . Bipolar disorder, current episode manic severe with psychotic features ([place]) [F31.2] 08/18/2016  . Bipolar affective disorder ([place]) [F31.9] 08/16/2016  . Carpal tunnel syndrome on right [G56.01] 02/04/2016  . Umbilical hernia [C78.9] 12/26/2012  . Chest pain [R07.9] 03/31/2012  . GERD (gastroesophageal reflux disease) [K21.9] 03/06/2012  . Hypothyroidism [E03.9] 03/06/2012  . COPD (chronic obstructive pulmonary disease) ([place]) [J44.9] 03/06/2012   Total Time spent with patient: 25 minutes  Past Psychiatric History: see HPI  Past Medical History:  Past Medical History:  Diagnosis  Date  . Anxiety   . Arthritis   . Asthma   . Bipolar 1 disorder ([place])   . Carpal tunnel syndrome on right 02/04/2016  . Chronic insomnia   . COPD (chronic obstructive pulmonary disease) ([place])   . Depression   . Diaphragmatic hernia   . Dizziness   . ED (erectile dysfunction)   . GERD (gastroesophageal reflux disease)   . HLD (hyperlipidemia)   . Hypothyroidism   . SOB (shortness of breath)   . Tobacco use   . Umbilical hernia without obstruction and without gangrene     Past Surgical History:  Procedure Laterality Date  . COLONOSCOPY    . INSERTION OF MESH N/A 01/01/2013   Procedure: INSERTION OF MESH;  Surgeon: Merrie Roof, MD;  Location: [place];  Service: General;  Laterality: N/A;  . UMBILICAL HERNIA REPAIR N/A 01/01/2013   Procedure: HERNIA REPAIR UMBILICAL ADULT;  Surgeon: Merrie Roof, MD;  Location: [place];  Service: General;  Laterality: N/A;  . UPPER GI ENDOSCOPY     Family History:  Family History  Problem Relation Age of Onset  . Arrhythmia    . ALS    . Prostate cancer Maternal Grandfather   . Colon cancer Paternal Grandfather   . Heart disease Mother   . Arthritis Mother   . Mental illness Brother    Family Psychiatric  History: see HPI Social History:  History  Alcohol Use  . Yes    Comment: "Never, hardly one at all"  History  Drug Use No    Social History   Social History  . Marital status: Single    Spouse name: N/A  . Number of children: 0  . Years of education: N/A   Occupational History  . disabled    Social History Main Topics  . Smoking status: Current Every Day Smoker    Packs/day: 2.00    Years: 40.00    Types: Cigarettes  . Smokeless tobacco: Never Used  . Alcohol use Yes     Comment: "Never, hardly one at all"  . Drug use: No  . Sexual activity: Not Asked   Other Topics Concern  . None   Social History Narrative  . None   Additional Social History:   Sleep: Fair  Appetite:  Fair  Current  Medications: Current Facility-Administered Medications  Medication Dose Route Frequency Provider Last Rate Last Dose  . acetaminophen (TYLENOL) tablet 650 mg  650 mg Oral Q6H PRN Patrecia Pour, NP      . alum & mag hydroxide-simeth (MAALOX/MYLANTA) 200-200-20 MG/5ML suspension 30 mL  30 mL Oral Q6H PRN Laverle Hobby, PA-C   30 mL at 08/19/16 0253  . hydrOXYzine (ATARAX/VISTARIL) tablet 25 mg  25 mg Oral Q6H PRN Patrecia Pour, NP   25 mg at 08/17/16 2107  . lamoTRIgine (LAMICTAL) tablet 25 mg  25 mg Oral BID Ursula Alert, MD      . levothyroxine (SYNTHROID, LEVOTHROID) tablet 150 mcg  150 mcg Oral QAC breakfast Patrecia Pour, NP   150 mcg at 08/23/16 0610  . lidocaine (LIDODERM) 5 % 1 patch  1 patch Transdermal Q24H Rozetta Nunnery, NP   1 patch at 08/22/16 2119  . LORazepam (ATIVAN) tablet 1 mg  1 mg Oral Q6H PRN Ursula Alert, MD   1 mg at 08/22/16 2210  . magnesium hydroxide (MILK OF MAGNESIA) suspension 30 mL  30 mL Oral Daily PRN Patrecia Pour, NP      . nicotine (NICODERM CQ - dosed in mg/24 hours) patch 21 mg  21 mg Transdermal Daily Patrecia Pour, NP      . OLANZapine (ZYPREXA) tablet 10 mg  10 mg Oral TID PRN Ursula Alert, MD   10 mg at 08/22/16 2210   Or  . OLANZapine (ZYPREXA) injection 10 mg  10 mg Intramuscular TID PRN Ursula Alert, MD      . pantoprazole (PROTONIX) EC tablet 40 mg  40 mg Oral BID AC Marvin Maenza, MD   40 mg at 08/23/16 0610  . QUEtiapine (SEROQUEL) tablet 12.5 mg  12.5 mg Oral QPC breakfast Ursula Alert, MD   12.5 mg at 08/23/16 0819  . QUEtiapine (SEROQUEL) tablet 12.5 mg  12.5 mg Oral QPC lunch Ursula Alert, MD   12.5 mg at 08/23/16 1218  . QUEtiapine (SEROQUEL) tablet 250 mg  250 mg Oral QHS Adriel Kessen, MD      . traZODone (DESYREL) tablet 100 mg  100 mg Oral QHS PRN Rozetta Nunnery, NP   100 mg at 08/22/16 2210    Lab Results:  No results found for this or any previous visit (from the past 48 hour(s)).  Blood Alcohol level:  Lab Results   Component Value Date   ETH <5 08/20/2016   ETH <5 93/81/8299    Metabolic Disorder Labs: No results found for: HGBA1C, MPG No results found for: PROLACTIN No results found for: CHOL, TRIG, HDL, CHOLHDL, VLDL, LDLCALC  Physical Findings: AIMS: Facial  and Oral Movements Muscles of Facial Expression: None, normal Lips and Perioral Area: None, normal Jaw: None, normal Tongue: None, normal,Extremity Movements Upper (arms, wrists, hands, fingers): None, normal Lower (legs, knees, ankles, toes): None, normal, Trunk Movements Neck, shoulders, hips: None, normal, Overall Severity Severity of abnormal movements (highest score from questions above): None, normal Incapacitation due to abnormal movements: None, normal Patient's awareness of abnormal movements (rate only patient's report): No Awareness, Dental Status Current problems with teeth and/or dentures?: No Does patient usually wear dentures?: Yes  CIWA:  CIWA-Ar Total: 2 COWS:     Musculoskeletal: Strength & Muscle Tone: within normal limits Gait & Station: normal Patient leans: N/A  Psychiatric Specialty Exam: Physical Exam  Nursing note and vitals reviewed.   Review of Systems  Psychiatric/Behavioral: The patient is nervous/anxious.   All other systems reviewed and are negative.    Blood pressure (!) 133/91, pulse 98, temperature 98 F (36.7 C), resp. rate 16, height 5\' 5"  (1.651 m), weight 74.8 kg (165 lb), SpO2 99 %.Body mass index is 27.46 kg/m.  General Appearance: Fairly Groomed  Eye Contact:  Fair  Speech:  Pressured  Volume:  Increased  Mood:  Angry and Anxious  Affect:  Labile  Thought Process:  Coherent and Descriptions of Associations: Tangential  Orientation:  Full (Time, Place, and Person)  Thought Content:  Delusions and Rumination  Suicidal Thoughts:  No   Homicidal Thoughts:  No   Memory: immediate , recent , remote - fair  Judgement:  Fair  Insight:  Fair  Psychomotor Activity:  Normal   Concentration:  Concentration: Fair and Attention Span: Fair  Recall:  [employer] of Knowledge:  Fair  Language:  Fair  Akathisia:  Negative  Handed:  Right  AIMS (if indicated):     Assets:  Communication Skills Desire for Improvement Resilience  ADL's: wnl  Cognition:  WNL  Sleep:  Number of Hours: 6   Bipolar disorder, current episode manic severe with psychotic features ([place]) unstable  Will continue today 08/23/16 plan as below except where it is noted.      Assessment - Patient today seen as pressured , irritable , continues to appear delusional and grandiose , requiring redirection often, will continue to readjust medications.   Treatment Plan Summary :  Will increase Lamictal to 25 mg po bid for mood lability. Will increase Seroquel to 250 mg po qhs and 12.5 mg po bid for psychosis/mood sx. Will continue Trazodone 100 mg po qhs prn for insomnia. CSW will continue to work on disposition. Pt had seziure like episode here after admission, was medically cleared per ED, however will need neurology clearance . Pt reports he already has a neurologist as well as has a PMD who follows him closely.  Pt declines EEG here. Pt with hyponatremia - 128 ( 08/20/16) - will repeat again and monitor closely.       Theador Jezewski, MD 08/23/2016, 12:34 PM

## 2016-08-23 NOTE — Progress Notes (Signed)
Adult Psychoeducational Group Note  Date:  08/23/2016 Time:  8:48 PM  Group Topic/Focus:  Wrap-Up Group:   The focus of this group is to help patients review their daily goal of treatment and discuss progress on daily workbooks.  Participation Level:  Active  Participation Quality:  Appropriate  Affect:  Appropriate  Cognitive:  Appropriate  Insight: Appropriate  Engagement in Group:  Engaged  Modes of Intervention:  Discussion  Additional Comments: The patient expressed that he rates today a 10.The patient also said that he attended groups.  Nash Shearer 08/23/2016, 8:48 PM

## 2016-08-24 LAB — BASIC METABOLIC PANEL
ANION GAP: 12 (ref 5–15)
BUN: 14 mg/dL (ref 6–20)
CO2: 23 mmol/L (ref 22–32)
Calcium: 9.5 mg/dL (ref 8.9–10.3)
Chloride: 97 mmol/L — ABNORMAL LOW (ref 101–111)
Creatinine, Ser: 1.41 mg/dL — ABNORMAL HIGH (ref 0.61–1.24)
GFR calc Af Amer: >60 mL/min (ref >60)
GFR calc non Af Amer: 53 mL/min — ABNORMAL LOW (ref >60)
GLUCOSE: 151 mg/dL — AB (ref 65–99)
POTASSIUM: 4.2 mmol/L (ref 3.5–5.1)
Sodium: 132 mmol/L — ABNORMAL LOW (ref 135–145)

## 2016-08-24 MED ORDER — QUETIAPINE FUMARATE 300 MG PO TABS
300.0000 mg | ORAL_TABLET | Freq: Every day | ORAL | Status: DC
  Administered 2016-08-24: 300 mg via ORAL
  Filled 2016-08-24 (×4): qty 1

## 2016-08-24 NOTE — Progress Notes (Signed)
D: Pt continues to be manic and delusional; states, "You don't what you are talking about; I have 4 degrees and one of them is pharmacology." Pt at the time of assessment denied SI, HI, depression, anxiety or AVH. Pt however, complained of severe L. knee pain. Pt remained irritable and hyper-verbal; the Lidocaine is not doing anything; I need that shot (Toradol IM) that I got at the ED. A: Medications offered as prescribed. All patient's questions and concerns were addressed. Support, encouragement, and safe environment provided. 15-minute safety checks continue. R: Pt was med compliant. Pt attended wrap-up group. Safety checks continue.

## 2016-08-24 NOTE — Progress Notes (Signed)
The patient walked out of group shortly after it began.

## 2016-08-24 NOTE — [facility] Group Notes (Signed)
[facility] LCSW Group Therapy  08/24/2016 1:29 PM   Type of Therapy:  Group Therapy  Participation Level:  Active  Participation Quality:  Attentive  Affect:  Appropriate  Cognitive:  Appropriate  Insight:  Improving  Engagement in Therapy:  Engaged  Modes of Intervention:  Clarification, Education, Exploration and Socialization  Summary of Progress/Problems: Today's group focused on relapse prevention.  We defined the term, and then brainstormed on ways to prevent relapse. Invited.  Chose to not attend.  Roque Lias B 08/24/2016 , 1:29 PM

## 2016-08-24 NOTE — Progress Notes (Signed)
Recreation Therapy Notes  Animal-Assisted Activity (AAA) Program Checklist/Progress Notes Patient Eligibility Criteria Checklist & Daily Group note for Rec Tx Intervention  Date: 04.17.2018 Time: 2:45pm Location: [address]    AAA/T Program Assumption of Risk Form signed by Patient/ or Parent Legal Guardian Yes  Patient is free of allergies or sever asthma Yes  Patient reports no fear of animals Yes  Patient reports no history of cruelty to animals Yes  Patient understands his/her participation is voluntary Yes  Behavioral Response: Did not attend.     Clinical Observations/Feedback: Patient discussed with MD for appropriateness in pet therapy session. Both LRT and MD agree patient is appropriate for participation. Patient presented hyperverbal, with pressured speech and upset about not being d/c when he expected to be d/c. Patient offered participation in session and signed necessary consent form to participate, however only did so after voicing his opposition to "foreign doctor." Stating he suspected a language barrier is preventing assigned MD from d/c patient. Patietn expressed desire to work with male doctor. LRT offered patient support and encouraged him to work with patient and reasured him language barrier was not preventing his d/c. Patient clamed at this point and voiced interest in attending session.   Despite signing consent form and expressing interest in attending patient did not attend session.    [name] [name] [name] [name], LRT/CTRS        Marieann Zipp L 08/24/2016 3:02 PM

## 2016-08-24 NOTE — Progress Notes (Signed)
DAR NOTE: Patient continues to be hyperactive on approach on the unit.  Patient preoccupied with discharge and was on the phone most of the shift making arrangement.  Affect and mood remained anxious.  Denies suicidal thoughts, auditory and visual hallucinations.  Described energy level as hyper and concentration as good.  Rates depression at 0, hopelessness at 0, and anxiety at 0.  Maintained on routine safety checks.  Medications given as prescribed.  Support and encouragement offered as needed.  Attended group and participated.  States goal for today is "discharge."  Patient was visible in the hallway interacting with staff and peers.

## 2016-08-24 NOTE — Progress Notes (Signed)
[facility] MD Progress Note  08/24/2016 3:17 PM [name] [name]  MRN:  [id number] Subjective:  Patient states " I am fine , they are lying , I slept a lot last night , is there a camera in my room , how do they know if I slept or not."   Objective:  Patient seen and chart reviewed.Discussed patient with treatment team.  Pt seen as less pressured , less intrusive and more redirectable. Pt continues to lack insight , seen as rambling often when asked about his plan after DC. Pt also slept very little last night , however became angry and irritable when writer attempted to address this concern. Pt per RN , continues to need redirection on the unit.     Vitals:   08/24/16 0745 08/24/16 1100  BP: (!) 154/69 136/68  Pulse: 74 (!) 54  Resp:    Temp:        Principal Problem: Bipolar disorder, current episode manic severe with psychotic features ([place]) Diagnosis:   Patient Active Problem List   Diagnosis Date Noted  . Seizure-like activity ([place]) [R56.9] 08/23/2016  . Hyponatremia [E87.1] 08/18/2016  . Alcohol use disorder, moderate, dependence ([place]) [F10.20] 08/18/2016  . Bipolar disorder, current episode manic severe with psychotic features ([place]) [F31.2] 08/18/2016  . Bipolar affective disorder ([place]) [F31.9] 08/16/2016  . Carpal tunnel syndrome on right [G56.01] 02/04/2016  . Umbilical hernia [V03.5] 12/26/2012  . Chest pain [R07.9] 03/31/2012  . GERD (gastroesophageal reflux disease) [K21.9] 03/06/2012  . Hypothyroidism [E03.9] 03/06/2012  . COPD (chronic obstructive pulmonary disease) ([place]) [J44.9] 03/06/2012   Total Time spent with patient: 25 minutes  Past Psychiatric History: see HPI  Past Medical History:  Past Medical History:  Diagnosis Date  . Anxiety   . Arthritis   . Asthma   . Bipolar 1 disorder ([place])   . Carpal tunnel syndrome on right 02/04/2016  . Chronic insomnia   . COPD (chronic obstructive pulmonary disease) ([place])   . Depression   . Diaphragmatic hernia   .  Dizziness   . ED (erectile dysfunction)   . GERD (gastroesophageal reflux disease)   . HLD (hyperlipidemia)   . Hypothyroidism   . SOB (shortness of breath)   . Tobacco use   . Umbilical hernia without obstruction and without gangrene     Past Surgical History:  Procedure Laterality Date  . COLONOSCOPY    . INSERTION OF MESH N/A 01/01/2013   Procedure: INSERTION OF MESH;  Surgeon: Merrie Roof, MD;  Location: [facility];  Service: General;  Laterality: N/A;  . UMBILICAL HERNIA REPAIR N/A 01/01/2013   Procedure: HERNIA REPAIR UMBILICAL ADULT;  Surgeon: Merrie Roof, MD;  Location: [place];  Service: General;  Laterality: N/A;  . UPPER GI ENDOSCOPY     Family History:  Family History  Problem Relation Age of Onset  . Arrhythmia    . ALS    . Prostate cancer Maternal Grandfather   . Colon cancer Paternal Grandfather   . Heart disease Mother   . Arthritis Mother   . Mental illness Brother    Family Psychiatric  History: see HPI Social History:  History  Alcohol Use  . Yes    Comment: "Never, hardly one at all"     History  Drug Use No    Social History   Social History  . Marital status: Single    Spouse name: N/A  . Number of children: 0  . Years of education:  N/A   Occupational History  . disabled    Social History Main Topics  . Smoking status: Current Every Day Smoker    Packs/day: 2.00    Years: 40.00    Types: Cigarettes  . Smokeless tobacco: Never Used  . Alcohol use Yes     Comment: "Never, hardly one at all"  . Drug use: No  . Sexual activity: Not Asked   Other Topics Concern  . None   Social History Narrative  . None   Additional Social History:   Sleep: Poor  Appetite:  Fair  Current Medications: Current Facility-Administered Medications  Medication Dose Route Frequency Provider Last Rate Last Dose  . acetaminophen (TYLENOL) tablet 650 mg  650 mg Oral Q6H PRN Patrecia Pour, NP      . alum & mag hydroxide-simeth (MAALOX/MYLANTA)  200-200-20 MG/5ML suspension 30 mL  30 mL Oral Q6H PRN Laverle Hobby, PA-C   30 mL at 08/23/16 2316  . hydrOXYzine (ATARAX/VISTARIL) tablet 25 mg  25 mg Oral Q6H PRN Patrecia Pour, NP   25 mg at 08/24/16 6761  . lamoTRIgine (LAMICTAL) tablet 25 mg  25 mg Oral BID Ursula Alert, MD   25 mg at 08/24/16 0830  . levothyroxine (SYNTHROID, LEVOTHROID) tablet 150 mcg  150 mcg Oral QAC breakfast Patrecia Pour, NP   150 mcg at 08/24/16 9509  . lidocaine (LIDODERM) 5 % 1 patch  1 patch Transdermal Q24H Rozetta Nunnery, NP   1 patch at 08/23/16 2125  . LORazepam (ATIVAN) tablet 1 mg  1 mg Oral Q6H PRN Ursula Alert, MD   1 mg at 08/22/16 2210  . magnesium hydroxide (MILK OF MAGNESIA) suspension 30 mL  30 mL Oral Daily PRN Patrecia Pour, NP      . nicotine (NICODERM CQ - dosed in mg/24 hours) patch 21 mg  21 mg Transdermal Daily Patrecia Pour, NP      . OLANZapine (ZYPREXA) tablet 10 mg  10 mg Oral TID PRN Ursula Alert, MD   10 mg at 08/22/16 2210   Or  . OLANZapine (ZYPREXA) injection 10 mg  10 mg Intramuscular TID PRN Ursula Alert, MD      . pantoprazole (PROTONIX) EC tablet 40 mg  40 mg Oral BID AC Ursula Alert, MD   40 mg at 08/24/16 3267  . QUEtiapine (SEROQUEL) tablet 12.5 mg  12.5 mg Oral QPC breakfast Farley Crooker, MD   12.5 mg at 08/24/16 0830  . QUEtiapine (SEROQUEL) tablet 12.5 mg  12.5 mg Oral QPC lunch Lexa Coronado, MD   12.5 mg at 08/24/16 1213  . QUEtiapine (SEROQUEL) tablet 300 mg  300 mg Oral QHS Ursula Alert, MD      . traZODone (DESYREL) tablet 100 mg  100 mg Oral QHS PRN Rozetta Nunnery, NP   100 mg at 08/23/16 2122    Lab Results:  Results for orders placed or performed during the hospital encounter of 08/17/16 (from the past 48 hour(s))  Basic metabolic panel     Status: Abnormal   Collection Time: 08/24/16  6:30 AM  Result Value Ref Range   Sodium 132 (L) 135 - 145 mmol/L   Potassium 4.2 3.5 - 5.1 mmol/L   Chloride 97 (L) 101 - 111 mmol/L   CO2 23 22 - 32 mmol/L    Glucose, Bld 151 (H) 65 - 99 mg/dL   BUN 14 6 - 20 mg/dL   Creatinine, Ser 1.41 (H) 0.61 -  1.24 mg/dL   Calcium 9.5 8.9 - 10.3 mg/dL   GFR calc non Af Amer 53 (L) >60 mL/min   GFR calc Af Amer >60 >60 mL/min    Comment: (NOTE) The eGFR has been calculated using the CKD EPI equation. This calculation has not been validated in all clinical situations. eGFR's persistently <60 mL/min signify possible Chronic Kidney Disease.    Anion gap 12 5 - 15    Comment: Performed at [facility], [place] [address], [place], [place] [address]    Blood Alcohol level:  Lab Results  Component Value Date   ETH <5 08/20/2016   ETH <5 76/22/6333    Metabolic Disorder Labs: No results found for: HGBA1C, MPG No results found for: PROLACTIN No results found for: CHOL, TRIG, HDL, CHOLHDL, VLDL, LDLCALC  Physical Findings: AIMS: Facial and Oral Movements Muscles of Facial Expression: None, normal Lips and Perioral Area: None, normal Jaw: None, normal Tongue: None, normal,Extremity Movements Upper (arms, wrists, hands, fingers): None, normal Lower (legs, knees, ankles, toes): None, normal, Trunk Movements Neck, shoulders, hips: None, normal, Overall Severity Severity of abnormal movements (highest score from questions above): None, normal Incapacitation due to abnormal movements: None, normal Patient's awareness of abnormal movements (rate only patient's report): No Awareness, Dental Status Current problems with teeth and/or dentures?: Yes Does patient usually wear dentures?: Yes  CIWA:  CIWA-Ar Total: 1 COWS:     Musculoskeletal: Strength & Muscle Tone: within normal limits Gait & Station: normal Patient leans: N/A  Psychiatric Specialty Exam: Physical Exam  Nursing note and vitals reviewed.   Review of Systems  Psychiatric/Behavioral: The patient is nervous/anxious.   All other systems reviewed and are negative.    Blood pressure 136/68, pulse (!) 54, temperature  98.2 F (36.8 C), temperature source Oral, resp. rate 18, height 5' 5"  (1.651 m), weight 74.8 kg (165 lb), SpO2 99 %.Body mass index is 27.46 kg/m.  General Appearance: Fairly Groomed  Eye Contact:  Fair  Speech:  Pressured  Volume:  Normal  Mood:  Angry and Anxious, on and off ,pleasant at times  Affect:  Labile  Thought Process:  Coherent and Descriptions of Associations: Tangential  Orientation:  Full (Time, Place, and Person)  Thought Content:  Rumination  Suicidal Thoughts:  No   Homicidal Thoughts:  No   Memory: immediate, recent , remote - fair  Judgement:  Fair  Insight:  Fair  Psychomotor Activity:  Normal  Concentration:  Concentration: Fair and Attention Span: Fair  Recall:  [employer] of Knowledge:  Fair  Language:  Fair  Akathisia:  Negative  Handed:  Right  AIMS (if indicated):     Assets:  Communication Skills Desire for Improvement Resilience  ADL's: wnl  Cognition:  WNL  Sleep:  Number of Hours: 1.25   Bipolar disorder, current episode manic severe with psychotic features ([place]) unstable - improving  Will continue today 08/24/16  plan as below except where it is noted.         Assessment - Patient continues to be not sleeping, is pressured, impulsive, tangential , continues to need medication changes. Continue treatment.   Treatment Plan Summary :  Will continue Lamictal 25 mg po bid for mood sx. Will increase Seroquel to 300 mg po qhs and 12.5 mg po bid for psychosis. Will continue Trazodone 100 mg po qhs prn for insomnia. CSW will continue to work on disposition. Labs reviewed Na+ is uptrending - improving. Patient has a PMD with  whom he can follow up with for his Na+ level as well as his seizure like spells. Pt agrees to same - states he has everything set up. Continue to treat.     Luisangel Wainright, MD 08/24/2016, 3:17 PM

## 2016-08-24 NOTE — Progress Notes (Signed)
Recreation Therapy Notes  Date: 08/24/16 Time: 1000 Location: 300 Hall Dayroom   Group Topic: Communication, Team Building, Problem Solving  Goal Area(s) Addresses:  Patient will effectively work with peer towards shared goal.  Patient will identify skill used to make activity successful.  Patient will identify how skills used during activity can be used to reach post d/c goals.   Intervention: STEM Activity   Activity: [employer]. Patients were provided the following materials: 5 drinking straws, 5 rubber bands, 5 paper clips, 2 index cards, 2 drinking cups, and 2 toilet paper rolls. Using the provided materials patients were asked to build a launching mechanisms to launch a ping pong ball approximately 12 feet. Patients were divided into teams of 3-5.   Education: Education officer, community, Dentist.   Education Outcome: Acknowledges education/In group clarification offered/Needs additional education.   Clinical Observations/Feedback: Pt did not attend group.   [name], LRT/CTRS         [name] A 08/24/2016 12:02 PM

## 2016-08-25 MED ORDER — QUETIAPINE FUMARATE 100 MG PO TABS
350.0000 mg | ORAL_TABLET | Freq: Every day | ORAL | Status: DC
  Administered 2016-08-25: 350 mg via ORAL
  Filled 2016-08-25 (×3): qty 3.5

## 2016-08-25 NOTE — Progress Notes (Signed)
D   Pt is intrusive and very talkative    He said he wants to be discharged because there is nothing wrong with him and the only reason the doctor is keeping him is because he has good insurance    A    Verbal support given   Medications administered and effectiveness monitored    Q 15 min checks R   Pt is safe at present and receptive to verbal support

## 2016-08-25 NOTE — Plan of Care (Signed)
Problem: Coping: Goal: Ability to identify and develop effective coping behavior will improve Outcome: Not Progressing Patient not focused on coping skills. States, "I'm no bipolar. I'm just up, all the time. I'm fine."  Problem: Safety: Goal: Periods of time without injury will increase Outcome: Progressing Patient has not engaged in self harm, denies SI.

## 2016-08-25 NOTE — Progress Notes (Signed)
D: The patient has been awake for the past hour and a half and is moving around quite a bit. He approaches the nurses station every few minutes or so to plead his case. He states that he does not deserve to be in the hospital and that he is ready to go home. He also claims that it is his brother and mother that deserve to be hospitalized, not him.   A: Redirected the patient for talking loudly and for pacing in the hallway. He was also redirected for talking almost non-stop in the hallway, especially at the nurses station.   R: Becomes loud and argumentative with this Pryor Curia and is refusing to return to his room, ie. "your so immature, immature"... "I'm almost twenty years older than you". The patient was recently observed re-arranging the furniture in his bedroom by moving his bed so that it is facing the doorway.

## 2016-08-25 NOTE — Progress Notes (Addendum)
[facility] MD Progress Note  08/25/2016 1:42 PM [name] [name]  MRN:  [id number] Subjective:  Patient reports he is feeling fine . He is focused on being discharged soon.  Currently denies medication side effects    Objective:  Patient seen and chart reviewed.Discussed patient with treatment team.  Patient presents with pressured speech, tends to ramble and becomes tangential , difficult to follow train of thought at times. His major focus is being discharged soon.  As per staff, he has improved partially compared to admission presentation, but does continue to present with pressured speech at times, loose thought process, and limited insight. He is intrusive at times, but is more redirectable than on admission, and is not behaving in any grossly disruptive or threatening manner. He reports he is sleeping better , and states " I think I slept the whole night". Denies medication side effects. He had a witnessed grand-mal seizure several days ago- no further seizure activity . When asked if having any self injurious ideations states " of course not, I love myself , what good will a gun do me, I don't even like them things". Serum Na+ improving , but still low.    Vitals:   08/25/16 0634 08/25/16 0635  BP: 132/86 127/73  Pulse: 78 83  Resp: 18   Temp:        Principal Problem: Bipolar disorder, current episode manic severe with psychotic features ([place]) Diagnosis:   Patient Active Problem List   Diagnosis Date Noted  . Seizure-like activity ([place]) [R56.9] 08/23/2016  . Hyponatremia [E87.1] 08/18/2016  . Alcohol use disorder, moderate, dependence ([place]) [F10.20] 08/18/2016  . Bipolar disorder, current episode manic severe with psychotic features ([place]) [F31.2] 08/18/2016  . Bipolar affective disorder ([place]) [F31.9] 08/16/2016  . Carpal tunnel syndrome on right [G56.01] 02/04/2016  . Umbilical hernia [U23.5] 12/26/2012  . Chest pain [R07.9] 03/31/2012  . GERD (gastroesophageal reflux disease)  [K21.9] 03/06/2012  . Hypothyroidism [E03.9] 03/06/2012  . COPD (chronic obstructive pulmonary disease) ([place]) [J44.9] 03/06/2012   Total Time spent with patient: 20 minutes  Past Psychiatric History: see HPI  Past Medical History:  Past Medical History:  Diagnosis Date  . Anxiety   . Arthritis   . Asthma   . Bipolar 1 disorder ([place])   . Carpal tunnel syndrome on right 02/04/2016  . Chronic insomnia   . COPD (chronic obstructive pulmonary disease) ([place])   . Depression   . Diaphragmatic hernia   . Dizziness   . ED (erectile dysfunction)   . GERD (gastroesophageal reflux disease)   . HLD (hyperlipidemia)   . Hypothyroidism   . SOB (shortness of breath)   . Tobacco use   . Umbilical hernia without obstruction and without gangrene     Past Surgical History:  Procedure Laterality Date  . COLONOSCOPY    . INSERTION OF MESH N/A 01/01/2013   Procedure: INSERTION OF MESH;  Surgeon: Merrie Roof, MD;  Location: [facility];  Service: General;  Laterality: N/A;  . UMBILICAL HERNIA REPAIR N/A 01/01/2013   Procedure: HERNIA REPAIR UMBILICAL ADULT;  Surgeon: Merrie Roof, MD;  Location: [facility];  Service: General;  Laterality: N/A;  . UPPER GI ENDOSCOPY     Family History:  Family History  Problem Relation Age of Onset  . Arrhythmia    . ALS    . Prostate cancer Maternal Grandfather   . Colon cancer Paternal Grandfather   . Heart disease Mother   . Arthritis Mother   .  Mental illness Brother    Family Psychiatric  History: see HPI Social History:  History  Alcohol Use  . Yes    Comment: "Never, hardly one at all"     History  Drug Use No    Social History   Social History  . Marital status: Single    Spouse name: N/A  . Number of children: 0  . Years of education: N/A   Occupational History  . disabled    Social History Main Topics  . Smoking status: Current Every Day Smoker    Packs/day: 2.00    Years: 40.00    Types: Cigarettes  . Smokeless tobacco: Never Used   . Alcohol use Yes     Comment: "Never, hardly one at all"  . Drug use: No  . Sexual activity: Not Asked   Other Topics Concern  . None   Social History Narrative  . None   Additional Social History:   Sleep: fair, as per chart notes, slept about 4 hours last night  Appetite:  Improving   Current Medications: Current Facility-Administered Medications  Medication Dose Route Frequency Provider Last Rate Last Dose  . acetaminophen (TYLENOL) tablet 650 mg  650 mg Oral Q6H PRN Patrecia Pour, NP      . alum & mag hydroxide-simeth (MAALOX/MYLANTA) 200-200-20 MG/5ML suspension 30 mL  30 mL Oral Q6H PRN Laverle Hobby, PA-C   30 mL at 08/23/16 2316  . hydrOXYzine (ATARAX/VISTARIL) tablet 25 mg  25 mg Oral Q6H PRN Patrecia Pour, NP   25 mg at 08/25/16 1201  . lamoTRIgine (LAMICTAL) tablet 25 mg  25 mg Oral BID Ursula Alert, MD   25 mg at 08/25/16 0813  . levothyroxine (SYNTHROID, LEVOTHROID) tablet 150 mcg  150 mcg Oral QAC breakfast Patrecia Pour, NP   150 mcg at 08/25/16 8115  . lidocaine (LIDODERM) 5 % 1 patch  1 patch Transdermal Q24H Rozetta Nunnery, NP   1 patch at 08/24/16 2145  . LORazepam (ATIVAN) tablet 1 mg  1 mg Oral Q6H PRN Ursula Alert, MD   1 mg at 08/25/16 1333  . magnesium hydroxide (MILK OF MAGNESIA) suspension 30 mL  30 mL Oral Daily PRN Patrecia Pour, NP      . OLANZapine (ZYPREXA) tablet 10 mg  10 mg Oral TID PRN Ursula Alert, MD   10 mg at 08/22/16 2210   Or  . OLANZapine (ZYPREXA) injection 10 mg  10 mg Intramuscular TID PRN Ursula Alert, MD      . pantoprazole (PROTONIX) EC tablet 40 mg  40 mg Oral BID AC Saramma Eappen, MD   40 mg at 08/25/16 0625  . QUEtiapine (SEROQUEL) tablet 12.5 mg  12.5 mg Oral QPC breakfast Ursula Alert, MD   12.5 mg at 08/25/16 0813  . QUEtiapine (SEROQUEL) tablet 12.5 mg  12.5 mg Oral QPC lunch Ursula Alert, MD   12.5 mg at 08/25/16 1201  . QUEtiapine (SEROQUEL) tablet 300 mg  300 mg Oral QHS Ursula Alert, MD   300 mg at  08/24/16 2131  . traZODone (DESYREL) tablet 100 mg  100 mg Oral QHS PRN Rozetta Nunnery, NP   100 mg at 08/24/16 2131    Lab Results:  Results for orders placed or performed during the hospital encounter of 08/17/16 (from the past 48 hour(s))  Basic metabolic panel     Status: Abnormal   Collection Time: 08/24/16  6:30 AM  Result Value Ref Range  Sodium 132 (L) 135 - 145 mmol/L   Potassium 4.2 3.5 - 5.1 mmol/L   Chloride 97 (L) 101 - 111 mmol/L   CO2 23 22 - 32 mmol/L   Glucose, Bld 151 (H) 65 - 99 mg/dL   BUN 14 6 - 20 mg/dL   Creatinine, Ser 1.41 (H) 0.61 - 1.24 mg/dL   Calcium 9.5 8.9 - 10.3 mg/dL   GFR calc non Af Amer 53 (L) >60 mL/min   GFR calc Af Amer >60 >60 mL/min    Comment: (NOTE) The eGFR has been calculated using the CKD EPI equation. This calculation has not been validated in all clinical situations. eGFR's persistently <60 mL/min signify possible Chronic Kidney Disease.    Anion gap 12 5 - 15    Comment: Performed at [facility], [place] [address] [place], [place] [address]    Blood Alcohol level:  Lab Results  Component Value Date   ETH <5 08/20/2016   ETH <5 19/14/7829    Metabolic Disorder Labs: No results found for: HGBA1C, MPG No results found for: PROLACTIN No results found for: CHOL, TRIG, HDL, CHOLHDL, VLDL, LDLCALC  Physical Findings: AIMS: Facial and Oral Movements Muscles of Facial Expression: None, normal Lips and Perioral Area: None, normal Jaw: None, normal Tongue: None, normal,Extremity Movements Upper (arms, wrists, hands, fingers): None, normal Lower (legs, knees, ankles, toes): None, normal, Trunk Movements Neck, shoulders, hips: None, normal, Overall Severity Severity of abnormal movements (highest score from questions above): None, normal Incapacitation due to abnormal movements: None, normal Patient's awareness of abnormal movements (rate only patient's report): No Awareness, Dental Status Current problems with  teeth and/or dentures?: Yes Does patient usually wear dentures?: Yes  CIWA:  CIWA-Ar Total: 2 COWS:     Musculoskeletal: Strength & Muscle Tone: within normal limits Gait & Station: normal Patient leans: N/A  Psychiatric Specialty Exam: Physical Exam  Nursing note and vitals reviewed.   Review of Systems  Psychiatric/Behavioral: The patient is nervous/anxious.   All other systems reviewed and are negative. denies chest pain, no shortness of breath, no nausea, no vomiting  Blood pressure 127/73, pulse 83, temperature 98.2 F (36.8 C), temperature source Oral, resp. rate 18, height 5' 5" (1.651 m), weight 74.8 kg (165 lb), SpO2 99 %.Body mass index is 27.46 kg/m.  General Appearance: Fairly Groomed  Eye Contact:  Good  Speech:  Pressured  Volume:  Normal  Mood:  expansive, but not irritable or angry at this time  Affect:  Labile  Thought Process:  Disorganized and Descriptions of Associations: Loose  Orientation:  Other:  fully alert and attentive   Thought Content:  denies hallucinations, no delusions expressed   Suicidal Thoughts:  No  Denies suicidal or self injurious ideations, denies any homicidal or violent ideations   Homicidal Thoughts:  No   Memory: recent and remote fair   Judgement:  Fair   Insight:  limited  Psychomotor Activity:  Normal and some pacing noted, but less than on admission  Concentration:  Concentration: Fair and Attention Span: Fair  Recall:  [employer] of Knowledge:  Fair  Language:  Fair  Akathisia:  No  Handed:  Right  AIMS (if indicated):     Assets:  Desire for Improvement Resilience  ADL's: wnl  Cognition:  WNL  Sleep:  Number of Hours: 3.75    Assessment - Some improvement since admission- less psychomotor agitation, less irritable and more redirectable. Continues to present with pressured speech, loose associations,  poor sleep, limited insight. Denies suicidal ideations. Tolerating medications well , denies side effects.     Treatment Plan Summary : Treatment plan reviewed as below today 4/18   Will continue Lamictal 25 mg po bid for mood sx. Will increase Seroquel to 350  mg po qhs and 12.5 mg po bid for psychosis. Will continue Trazodone 100 mg po qhs prn for insomnia. CSW will continue to work on disposition. Recheck BMP in AM      Jenne Campus, MD 08/25/2016, 1:42 PM  Patient ID: [name] [name], male   DOB: [birth year], 61 y.o.   MRN: [id number]

## 2016-08-25 NOTE — Progress Notes (Signed)
Psychoeducational Group Note  Date:  08/25/2016 Time:  2058  Group Topic/Focus:  Wrap-Up Group:   The focus of this group is to help patients review their daily goal of treatment and discuss progress on daily workbooks.  Participation Level: Did Not Attend  Participation Quality:  Not Applicable  Affect:  Not Applicable  Cognitive:  Not Applicable  Insight:  Not Applicable  Engagement in Group: Not Applicable  Additional Comments:  The patient did not attend group this evening.   Archie Balboa S 08/25/2016, 8:58 PM

## 2016-08-25 NOTE — [facility] Group Notes (Signed)
[facility] Group Notes:  (Counselor/Nursing/MHT/Case Management/Adjunct)  08/25/2016 1:15PM  Type of Therapy:  Group Therapy  Participation Level:  Active  Participation Quality:  Appropriate  Affect:  Flat  Cognitive:  Oriented  Insight:  Improving  Engagement in Group:  Limited  Engagement in Therapy:  Limited  Modes of Intervention:  Discussion, Exploration and Socialization  Summary of Progress/Problems: The topic for group was balance in life.  Pt participated in the discussion about when their life was in balance and out of balance and how this feels.  Pt discussed ways to get back in balance and short term goals they can work on to get where they want to be. Invited.  Chose to not attend.  [name] [name] 08/25/2016 1:20 PM

## 2016-08-25 NOTE — Progress Notes (Signed)
D: Pt continues to be manic and delusional; states, "I should be the one asking these questions because I have more digress than you." Pt at the time of assessment denied SI, HI, depression, anxiety or AVH. Pt however, complained of moderate L. knee pain. Pt remained irritable and hyper-verbal. A: Medications offered as prescribed. All patient's questions and concerns were addressed. Support, encouragement, and safe environment provided. 15-minute safety checks continue. R: Pt was med compliant. Pt attended wrap-up group. Safety checks continue.

## 2016-08-25 NOTE — Progress Notes (Signed)
D: Patient up and visible in the milieu. Remains intrusive, restless with rapid, pressured and loud speech. Displays tangential thought process. "You see this? I have a signed contract." (pt points to page in workbook) "They gave me some medicine that melted under my tongue and it about killed me. I had a small seizure that then progressed to a major seizure. That's why I don't trust you people. You just shoot me like a dart board." Patient jumping from topic to topic and theme often revolves around finances. Patient is rating depression, hopelessness and anxiety all at a 0/10 though states before lunch, "I need that vistaril. I'm worked up." Rates sleep as good, appetite as good, energy as normal and concentration as good. Patient's affect animated, mood elevated. "I'm not bipolar or manic. That's just what they tell me to get the check. I'm 61 years old. I'm just always up." States goal for today is to discharge as he states he has work to get back to. Denies pain, physical problems.   A: Medicated per orders, prn vistaril given per patient request. Emotional support offered and self inventory reviewed. Discussed POC with MD, SW. Fall precautions in place and reviewed.   R: Patient verbalizes understanding of POC,. On reassess, patient slightly calmer though remains with manic behavior. Patient denies SI/HI and remains safe on level III obs. No AVH.

## 2016-08-25 NOTE — Progress Notes (Signed)
Recreation Therapy Notes  Date: 08/25/16 Time: 1000 Location: 300 Hall Dayroom  Group Topic: Anger Management  Goal Area(s) Addresses:  Patient will identify triggers for anger.  Patient will identify physical reaction to anger.   Patient will identify benefit of using coping skills when angry.  Intervention: Worksheets, pencils  Activity: Anger Umbrella.  Patients were given a worksheet with an umbrella on it.  Patients were to identify the situations that cause them to get angry and write them inside the umbrella.  Patients were to then identify coping skills they could use to deal with their anger and write it on the outside of the umbrella.  Education:Anger Management, Discharge Planning   Education Outcome: Acknowledges education/In group clarification offered/Needs additional education.   Clinical Observations/Feedback: Pt did not attend group.   [name], LRT/CTRS         [name] A 08/25/2016 12:35 PM

## 2016-08-26 LAB — BASIC METABOLIC PANEL
ANION GAP: 9 (ref 5–15)
BUN: 13 mg/dL (ref 6–20)
CALCIUM: 9.6 mg/dL (ref 8.9–10.3)
CO2: 27 mmol/L (ref 22–32)
CREATININE: 1.15 mg/dL (ref 0.61–1.24)
Chloride: 102 mmol/L (ref 101–111)
GFR calc non Af Amer: >60 mL/min (ref >60)
Glucose, Bld: 119 mg/dL — ABNORMAL HIGH (ref 65–99)
Potassium: 4.2 mmol/L (ref 3.5–5.1)
SODIUM: 138 mmol/L (ref 135–145)

## 2016-08-26 MED ORDER — QUETIAPINE FUMARATE 400 MG PO TABS
400.0000 mg | ORAL_TABLET | Freq: Every day | ORAL | Status: DC
  Administered 2016-08-26: 400 mg via ORAL
  Filled 2016-08-26 (×2): qty 1
  Filled 2016-08-26: qty 2
  Filled 2016-08-26: qty 1

## 2016-08-26 NOTE — [facility] Group Notes (Signed)
Type of Therapy:  Group Therapy   Participation Level:  Engaged  Participation Quality:  Attentive  Affect:  Appropriate   Cognitive:  Alert   Insight:  Engaged  Engagement in Therapy:  Improving   Mode s of Intervention:  Education, Exploration, Socialization   Summary of Progress/Problems: [name] [name] was engaged during his stay. He was in and out of group a few times.   Tammi from the [facility] was here to tell her story of recovery and inform patients about [facility] and their services.

## 2016-08-26 NOTE — Progress Notes (Signed)
Recreation Therapy Notes  Date: 08/26/16 Time: 1000 Location: 300 Hall Dayroom  Group Topic: Wellness  Goal Area(s) Addresses:  Patient will define components of whole wellness. Patient will verbalize benefit of whole wellness.  Behavioral Response: Minimal  Intervention:  Chairs, Giant beach ball  Activity: Keep It Going Volleyball.  Patients were placed in a circle.  Patients were to pass the beach ball back and forth to each other.  The ball could bounce off the ground but could not come to a complete stop.  LRT would keep count of how many times the ball was hit.  If the ball comes to a stop, the count starts over.  Education: Wellness, Dentist.   Education Outcome: Acknowledges education/In group clarification offered/Needs additional education.   Clinical Observations/Feedback: Pt gave minimal participation.  Pt was in and out of the circle and focused on getting out of here to go home.   [name], LRT/CTRS         Ria Comment, Aniza Shor A 08/26/2016 11:40 AM

## 2016-08-26 NOTE — Progress Notes (Signed)
[facility] MD Progress Note  08/26/2016 5:24 PM [name] [name]  MRN:  [id number] Subjective:  Patient report " I am feeling fine". " I slept like a baby last night, don't pay attention to what they tell you" ( referring to Nursing Staff) " I am sleeping fine ". Denies medication side effects at this time.    Objective:  Patient seen and chart reviewed.Discussed patient with treatment team.  Patient remains hyper-verbal, pressured , somewhat intrusive. In general is jovial, pleasant, and does not present irritable or angry. His insight remains limited, and insists he is " perfectly fine", and today is more focused on his sleep, as he states " if I can sleep well it means I get discharged".  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 " My IQ is 160".  Somewhat disruptive on unit , but not violent or irritable. Denies medication side effects. Denies suicidal ideations.  BMP reviewed- no current hyponatremia - NA+ 138   Vitals:   08/26/16 0606 08/26/16 0608  BP: 92/66 102/73  Pulse: (!) 108 (!) 116  Resp: 18   Temp: 98.7 F (37.1 C)       Principal Problem: Bipolar disorder, current episode manic severe with psychotic features ([place]) Diagnosis:   Patient Active Problem List   Diagnosis Date Noted  . Seizure-like activity ([place]) [R56.9] 08/23/2016  . Hyponatremia [E87.1] 08/18/2016  . Alcohol use disorder, moderate, dependence ([place]) [F10.20] 08/18/2016  . Bipolar disorder, current episode manic severe with psychotic features ([place]) [F31.2] 08/18/2016  . Bipolar affective disorder ([place]) [F31.9] 08/16/2016  . Carpal tunnel syndrome on right [G56.01] 02/04/2016  . Umbilical hernia [W62.0] 12/26/2012  . Chest pain [R07.9] 03/31/2012  . GERD (gastroesophageal reflux disease) [K21.9] 03/06/2012  . Hypothyroidism [E03.9] 03/06/2012  . COPD (chronic obstructive pulmonary disease) ([place]) [J44.9]  03/06/2012   Total Time spent with patient: 20 minutes  Past Psychiatric History: see HPI  Past Medical History:  Past Medical History:  Diagnosis Date  . Anxiety   . Arthritis   . Asthma   . Bipolar 1 disorder ([place])   . Carpal tunnel syndrome on right 02/04/2016  . Chronic insomnia   . COPD (chronic obstructive pulmonary disease) ([place])   . Depression   . Diaphragmatic hernia   . Dizziness   . ED (erectile dysfunction)   . GERD (gastroesophageal reflux disease)   . HLD (hyperlipidemia)   . Hypothyroidism   . SOB (shortness of breath)   . Tobacco use   . Umbilical hernia without obstruction and without gangrene     Past Surgical History:  Procedure Laterality Date  . COLONOSCOPY    . INSERTION OF MESH N/A 01/01/2013   Procedure: INSERTION OF MESH;  Surgeon: Merrie Roof, MD;  Location: [facility];  Service: General;  Laterality: N/A;  . UMBILICAL HERNIA REPAIR N/A 01/01/2013   Procedure: HERNIA REPAIR UMBILICAL ADULT;  Surgeon: Merrie Roof, MD;  Location: [facility];  Service: General;  Laterality: N/A;  . UPPER GI ENDOSCOPY     Family History:  Family History  Problem Relation Age of Onset  . Arrhythmia    . ALS    . Prostate cancer Maternal Grandfather   . Colon cancer Paternal Grandfather   . Heart disease Mother   . Arthritis Mother   . Mental illness Brother    Family Psychiatric  History: see  HPI Social History:  History  Alcohol Use  . Yes    Comment: "Never, hardly one at all"     History  Drug Use No    Social History   Social History  . Marital status: Single    Spouse name: N/A  . Number of children: 0  . Years of education: N/A   Occupational History  . disabled    Social History Main Topics  . Smoking status: Current Every Day Smoker    Packs/day: 2.00    Years: 40.00    Types: Cigarettes  . Smokeless tobacco: Never Used  . Alcohol use Yes     Comment: "Never, hardly one at all"  . Drug use: No  . Sexual activity: Not Asked   Other  Topics Concern  . None   Social History Narrative  . None   Additional Social History:   Sleep: fair, as per chart notes, slept about 4 hours last night  Appetite:  Improving   Current Medications: Current Facility-Administered Medications  Medication Dose Route Frequency Provider Last Rate Last Dose  . acetaminophen (TYLENOL) tablet 650 mg  650 mg Oral Q6H PRN Patrecia Pour, NP      . alum & mag hydroxide-simeth (MAALOX/MYLANTA) 200-200-20 MG/5ML suspension 30 mL  30 mL Oral Q6H PRN Laverle Hobby, PA-C   30 mL at 08/23/16 2316  . hydrOXYzine (ATARAX/VISTARIL) tablet 25 mg  25 mg Oral Q6H PRN Patrecia Pour, NP   25 mg at 08/25/16 1201  . lamoTRIgine (LAMICTAL) tablet 25 mg  25 mg Oral BID Ursula Alert, MD   25 mg at 08/26/16 1652  . levothyroxine (SYNTHROID, LEVOTHROID) tablet 150 mcg  150 mcg Oral QAC breakfast Patrecia Pour, NP   150 mcg at 08/26/16 3532  . lidocaine (LIDODERM) 5 % 1 patch  1 patch Transdermal Q24H Rozetta Nunnery, NP   1 patch at 08/25/16 2213  . LORazepam (ATIVAN) tablet 1 mg  1 mg Oral Q6H PRN Ursula Alert, MD   1 mg at 08/25/16 2214  . magnesium hydroxide (MILK OF MAGNESIA) suspension 30 mL  30 mL Oral Daily PRN Patrecia Pour, NP      . OLANZapine (ZYPREXA) tablet 10 mg  10 mg Oral TID PRN Ursula Alert, MD   10 mg at 08/22/16 2210   Or  . OLANZapine (ZYPREXA) injection 10 mg  10 mg Intramuscular TID PRN Ursula Alert, MD      . pantoprazole (PROTONIX) EC tablet 40 mg  40 mg Oral BID AC Saramma Eappen, MD   40 mg at 08/26/16 1652  . QUEtiapine (SEROQUEL) tablet 12.5 mg  12.5 mg Oral QPC breakfast Ursula Alert, MD   12.5 mg at 08/26/16 0736  . QUEtiapine (SEROQUEL) tablet 12.5 mg  12.5 mg Oral QPC lunch Ursula Alert, MD   12.5 mg at 08/26/16 1203  . QUEtiapine (SEROQUEL) tablet 350 mg  350 mg Oral QHS Jenne Campus, MD   350 mg at 08/25/16 2213  . traZODone (DESYREL) tablet 100 mg  100 mg Oral QHS PRN Rozetta Nunnery, NP   100 mg at 08/24/16 2131     Lab Results:  Results for orders placed or performed during the hospital encounter of 08/17/16 (from the past 48 hour(s))  Basic metabolic panel     Status: Abnormal   Collection Time: 08/26/16  6:02 AM  Result Value Ref Range   Sodium 138 135 - 145 mmol/L   Potassium  4.2 3.5 - 5.1 mmol/L   Chloride 102 101 - 111 mmol/L   CO2 27 22 - 32 mmol/L   Glucose, Bld 119 (H) 65 - 99 mg/dL   BUN 13 6 - 20 mg/dL   Creatinine, Ser 1.15 0.61 - 1.24 mg/dL   Calcium 9.6 8.9 - 10.3 mg/dL   GFR calc non Af Amer >60 >60 mL/min   GFR calc Af Amer >60 >60 mL/min    Comment: (NOTE) The eGFR has been calculated using the CKD EPI equation. This calculation has not been validated in all clinical situations. eGFR's persistently <60 mL/min signify possible Chronic Kidney Disease.    Anion gap 9 5 - 15    Comment: Performed at [facility], [place] [address], [place],  [address]    Blood Alcohol level:  Lab Results  Component Value Date   ETH <5 08/20/2016   ETH <5 05/12/7251    Metabolic Disorder Labs: No results found for: HGBA1C, MPG No results found for: PROLACTIN No results found for: CHOL, TRIG, HDL, CHOLHDL, VLDL, LDLCALC  Physical Findings: AIMS: Facial and Oral Movements Muscles of Facial Expression: None, normal Lips and Perioral Area: None, normal Jaw: None, normal Tongue: None, normal,Extremity Movements Upper (arms, wrists, hands, fingers): None, normal Lower (legs, knees, ankles, toes): None, normal, Trunk Movements Neck, shoulders, hips: None, normal, Overall Severity Severity of abnormal movements (highest score from questions above): None, normal Incapacitation due to abnormal movements: None, normal Patient's awareness of abnormal movements (rate only patient's report): No Awareness, Dental Status Current problems with teeth and/or dentures?: Yes Does patient usually wear dentures?: Yes  CIWA:  CIWA-Ar Total: 2 COWS:      Musculoskeletal: Strength & Muscle Tone: within normal limits Gait & Station: normal Patient leans: N/A  Psychiatric Specialty Exam: Physical Exam  Nursing note and vitals reviewed.   Review of Systems  Psychiatric/Behavioral: The patient is nervous/anxious.   All other systems reviewed and are negative. no vomiting, no nausea, no fever, no chills   Blood pressure 102/73, pulse (!) 116, temperature 98.7 F (37.1 C), temperature source Oral, resp. rate 18, height _0  (1.651 m), weight 74.8 kg (165 lb), SpO2 99 %.Body mass index is 27.46 kg/m.  General Appearance: grooming is improving   Eye Contact:  Good  Speech:  remains increased, pressured   Volume:  Normal  Mood:  remains expansive, but pleasant, jovial, not irritable   Affect:  pleasant, expansive   Thought Process:  Disorganized and Descriptions of Associations: Loose, although still disorganized , seems partially improved compared to prior   Orientation:  Other:  fully alert and attentive   Thought Content:  grandiose ideations, no hallucinations, no delusions   Suicidal Thoughts:  No  Denies suicidal or self injurious ideations, denies any homicidal or violent ideations   Homicidal Thoughts:  No   Memory: recent and remote fair   Judgement:  Fair    Insight:  Limited   Psychomotor Activity:  Normal  Concentration:  Concentration: Fair and Attention Span: Fair  Recall:  [employer] of Knowledge:  Fair  Language:  Fair  Akathisia:  Negative  Handed:  Right  AIMS (if indicated):     Assets:  Desire for Improvement Resilience  ADL's: improving   Cognition:  WNL  Sleep:  Number of Hours: 6.75    Assessment - patient remains pressured in speech, overly expansive in affect, and expressing some grandiose ideations. Insight remains limited. There has been some degree  of improvement, appears less intrusive today, and seems to be sleeping a little longer, better . Denies medication side effects and is tolerating  Seroquel well thus far.    Treatment Plan Summary : Treatment plan reviewed as below today 4/19   Will continue Lamictal 25 mg po bid for mood sx. Will increase Seroquel to 400   mg po qhs and 12.5 mg po bid for mania,  psychosis. Will continue Trazodone 100 mg po qhs prn for insomnia. CSW will continue to work on disposition.      Jenne Campus, MD 08/26/2016, 5:24 PM  Patient ID: Suanne Marker, male   DOB: [birth year], 61 y.o.   MRN: [id number]

## 2016-08-26 NOTE — Progress Notes (Signed)
D: Patient up and visible in the milieu. Pacing, intrusive and remains hyperverbal. Continues to state "you all gave me something that nearly killed me. I don't need to be here. I'm only here because people are trying to take my money. I've got insurance so these doctors are just milking it." Rating depression at a 0/10, hopelessness at a 0/10 and anxiety at a 0/10. Rates sleep as good, appetite as good, energy as good and concentration as good. Patient's affect animated, mood anxious, elevated. States goal for today is to "get back to work and make money." Denies pain, physical problems.   A: Medicated per orders, no prns requested or required. Emotional support offered and self inventory reviewed. Encouraged completion of Suicide Safety Plan. Discussed POC with MD, SW.  Fall precautions reviewed with patient and in place.   R: Patient verbalizes understanding of POC, fall prevention plan. Patient still requires private room due to behaviors. Patient denies SI/HI/AVH and remains safe on level III obs.

## 2016-08-26 NOTE — Plan of Care (Signed)
Problem: Education: Goal: Verbalization of understanding the information provided will improve Outcome: Not Progressing Patient remains hyperverbal, dominant in conversation. Does not show evidence of understanding information, education provided.  Problem: Safety: Goal: Ability to disclose and discuss suicidal ideas will improve Outcome: Progressing Patient denies SI.

## 2016-08-27 ENCOUNTER — Encounter (HOSPITAL_COMMUNITY): Payer: Self-pay | Admitting: Student

## 2016-08-27 DIAGNOSIS — R569 Unspecified convulsions: Secondary | ICD-10-CM

## 2016-08-27 MED ORDER — TRAZODONE HCL 100 MG PO TABS: 100.0000 mg | ORAL_TABLET | Freq: Every evening | ORAL | 0 refills | Status: AC | PRN

## 2016-08-27 MED ORDER — LAMOTRIGINE 25 MG PO TABS: 25.0000 mg | ORAL_TABLET | Freq: Two times a day (BID) | ORAL | 0 refills | Status: DC

## 2016-08-27 MED ORDER — HYDROXYZINE HCL 25 MG PO TABS: 25.0000 mg | ORAL_TABLET | Freq: Four times a day (QID) | ORAL | 0 refills | Status: DC | PRN

## 2016-08-27 MED ORDER — LIDOCAINE 5 % EX PTCH: 1.0000 | MEDICATED_PATCH | CUTANEOUS | 0 refills | Status: DC

## 2016-08-27 MED ORDER — LEVOTHYROXINE SODIUM 150 MCG PO TABS: 150.0000 ug | ORAL_TABLET | Freq: Every morning | ORAL | 0 refills | Status: AC

## 2016-08-27 MED ORDER — QUETIAPINE FUMARATE 400 MG PO TABS: 400.0000 mg | ORAL_TABLET | Freq: Every day | ORAL | 0 refills | Status: AC

## 2016-08-27 MED ORDER — PANTOPRAZOLE SODIUM 40 MG PO TBEC: 40.0000 mg | DELAYED_RELEASE_TABLET | Freq: Two times a day (BID) | ORAL | 0 refills | Status: DC

## 2016-08-27 MED ORDER — QUETIAPINE FUMARATE 25 MG PO TABS: ORAL_TABLET | ORAL | 0 refills | Status: DC

## 2016-08-27 NOTE — Tx Team (Signed)
Interdisciplinary Treatment and Diagnostic Plan Update  08/27/2016 Time of Session: 10:30 AM  [name] [name] MRN: [id number]  Principal Diagnosis: Bipolar disorder, current episode manic severe with psychotic features ([place])  Secondary Diagnoses: Principal Problem:   Bipolar disorder, current episode manic severe with psychotic features ([place]) Active Problems:   Hypothyroidism   Hyponatremia   Alcohol use disorder, moderate, dependence ([place])   Seizure-like activity ([place])   Current Medications:  Current Facility-Administered Medications  Medication Dose Route Frequency Provider Last Rate Last Dose  . acetaminophen (TYLENOL) tablet 650 mg  650 mg Oral Q6H PRN Patrecia Pour, NP      . alum & mag hydroxide-simeth (MAALOX/MYLANTA) 200-200-20 MG/5ML suspension 30 mL  30 mL Oral Q6H PRN Laverle Hobby, PA-C   30 mL at 08/23/16 2316  . hydrOXYzine (ATARAX/VISTARIL) tablet 25 mg  25 mg Oral Q6H PRN Patrecia Pour, NP   25 mg at 08/25/16 1201  . lamoTRIgine (LAMICTAL) tablet 25 mg  25 mg Oral BID Ursula Alert, MD   25 mg at 08/27/16 0750  . levothyroxine (SYNTHROID, LEVOTHROID) tablet 150 mcg  150 mcg Oral QAC breakfast Patrecia Pour, NP   150 mcg at 08/27/16 5883  . lidocaine (LIDODERM) 5 % 1 patch  1 patch Transdermal Q24H Rozetta Nunnery, NP   1 patch at 08/26/16 2240  . LORazepam (ATIVAN) tablet 1 mg  1 mg Oral Q6H PRN Ursula Alert, MD   1 mg at 08/26/16 2232  . magnesium hydroxide (MILK OF MAGNESIA) suspension 30 mL  30 mL Oral Daily PRN Patrecia Pour, NP      . OLANZapine (ZYPREXA) tablet 10 mg  10 mg Oral TID PRN Ursula Alert, MD   10 mg at 08/22/16 2210   Or  . OLANZapine (ZYPREXA) injection 10 mg  10 mg Intramuscular TID PRN Ursula Alert, MD      . pantoprazole (PROTONIX) EC tablet 40 mg  40 mg Oral BID AC Ursula Alert, MD   40 mg at 08/27/16 0620  . QUEtiapine (SEROQUEL) tablet 12.5 mg  12.5 mg Oral QPC breakfast Saramma Eappen, MD   12.5 mg at 08/27/16 1007  . QUEtiapine  (SEROQUEL) tablet 12.5 mg  12.5 mg Oral QPC lunch Ursula Alert, MD   12.5 mg at 08/26/16 1203  . QUEtiapine (SEROQUEL) tablet 400 mg  400 mg Oral QHS Jenne Campus, MD   400 mg at 08/26/16 2228  . traZODone (DESYREL) tablet 100 mg  100 mg Oral QHS PRN Rozetta Nunnery, NP   100 mg at 08/24/16 2131    PTA Medications: Prescriptions Prior to Admission  Medication Sig Dispense Refill Last Dose  . albuterol (PROVENTIL HFA;VENTOLIN HFA) 108 (90 Base) MCG/ACT inhaler Inhale 1-2 puffs into the lungs every 4 (four) hours as needed for wheezing or shortness of breath. (Patient not taking: Reported on 08/15/2016) 1 Inhaler 2 Not Taking at Unknown time  . clonazePAM (KLONOPIN) 1 MG tablet TAKE 1 TABLET BY MOUTH 3 TIMES DAILY AS NEEDED FOR ANXIETY  5   . dexamethasone (DECADRON) 4 MG tablet Take 3 tablets (12 mg total) by mouth once. 12mg  PO on 07/17/15. (Patient not taking: Reported on 08/15/2016) 3 tablet 0 Not Taking at Unknown time  . diphenhydrAMINE (BENADRYL) 25 MG tablet Take 2 tablets (50 mg total) by mouth every 4 (four) hours as needed for itching. (Patient not taking: Reported on 08/15/2016) 20 tablet 0 Not Taking at Unknown time  . divalproex (DEPAKOTE)  250 MG DR tablet Take 750 mg by mouth at bedtime.  2 Not Taking at Unknown time  . esomeprazole (NEXIUM) 40 MG capsule Take 40 mg by mouth daily at 12 noon.   08/15/2016 at Unknown time  . famotidine (PEPCID) 20 MG tablet Take 1 tablet (20 mg total) by mouth 2 (two) times daily. (Patient not taking: Reported on 08/15/2016) 30 tablet 0 Not Taking at Unknown time  . fluticasone (FLONASE) 50 MCG/ACT nasal spray Place 2 sprays into both nostrils daily.  0 Not Taking at Unknown time  . hydrocortisone 2.5 % lotion Apply topically 2 (two) times daily. (Patient not taking: Reported on 08/15/2016) 59 mL 0 Not Taking at Unknown time  . ipratropium-albuterol (DUONEB) 0.5-2.5 (3) MG/3ML SOLN Take 3 mLs by nebulization every 4 (four) hours as needed for wheezing.  3 Not  Taking at Unknown time  . levothyroxine (SYNTHROID, LEVOTHROID) 150 MCG tablet Take 150 mcg by mouth every morning.    unknown  . predniSONE (DELTASONE) 20 MG tablet Take 2 tablets (40 mg total) by mouth daily. (Patient not taking: Reported on 08/15/2016) 10 tablet 0 Not Taking at Unknown time  . QUEtiapine (SEROQUEL) 100 MG tablet Take 100 mg by mouth at bedtime.  2 08/14/2016 at Unknown time  . risperiDONE (RISPERDAL) 2 MG tablet Take 2 mg by mouth at bedtime.    Not Taking at Unknown time  . traZODone (DESYREL) 100 MG tablet Take 100 mg by mouth at bedtime.   Not Taking at Unknown time    Treatment Modalities: Medication Management, Group therapy, Case management,  1 to 1 session with clinician, Psychoeducation, Recreational therapy.   Physician Treatment Plan for Primary Diagnosis: Bipolar disorder, current episode manic severe with psychotic features ([place]) Long Term Goal(s): Improvement in symptoms so as ready for discharge  Short Term Goals: Ability to demonstrate self-control will improve and Ability to identify triggers associated with substance abuse/mental health issues will improve  Medication Management: Evaluate patient's response, side effects, and tolerance of medication regimen.  Therapeutic Interventions: 1 to 1 sessions, Unit Group sessions and Medication administration.  Evaluation of Outcomes: Adequate for Discharge   4/16: Pt continues to present with pressured, tangential speech and delusional thoughts.  Angry with family and threatening to "get back at my brother for putting my mother against me."   Increase Seroquel to 25 mgrs BID and 250 mgrs QHS for mood disorder  Continue Lamictal 25 mgrs QDAY for mood disorder   Physician Treatment Plan for Secondary Diagnosis: Principal Problem:   Bipolar disorder, current episode manic severe with psychotic features ([place]) Active Problems:   Hypothyroidism   Hyponatremia   Alcohol use disorder, moderate, dependence ([place])    Seizure-like activity ([place])   Long Term Goal(s): Improvement in symptoms so as ready for discharge  Short Term Goals: Compliance with prescribed medications will improve  Medication Management: Evaluate patient's response, side effects, and tolerance of medication regimen.  Therapeutic Interventions: 1 to 1 sessions, Unit Group sessions and Medication administration.  Evaluation of Outcomes: Adequate for Discharge   RN Treatment Plan for Primary Diagnosis: Bipolar disorder, current episode manic severe with psychotic features ([place]) Long Term Goal(s): Knowledge of disease and therapeutic regimen to maintain health will improve  Short Term Goals: Ability to verbalize frustration and anger appropriately will improve and Compliance with prescribed medications will improve  Medication Management: RN will administer medications as ordered by provider, will assess and evaluate patient's response and provide education to patient for prescribed  medication. RN will report any adverse and/or side effects to prescribing provider.  Therapeutic Interventions: 1 on 1 counseling sessions, Psychoeducation, Medication administration, Evaluate responses to treatment, Monitor vital signs and CBGs as ordered, Perform/monitor CIWA, COWS, AIMS and Fall Risk screenings as ordered, Perform wound care treatments as ordered.  Evaluation of Outcomes: Adequate for Discharge   LCSW Treatment Plan for Primary Diagnosis: Bipolar disorder, current episode manic severe with psychotic features ([place]) Long Term Goal(s): Safe transition to appropriate next level of care at discharge, Engage patient in therapeutic group addressing interpersonal concerns.  Short Term Goals: Engage patient in aftercare planning with referrals and resources and Increase skills for wellness and recovery  Therapeutic Interventions: Assess for all discharge needs, 1 to 1 time with Social worker, Explore available resources and support systems,  Assess for adequacy in community support network, Educate family and significant other(s) on suicide prevention, Complete Psychosocial Assessment, Interpersonal group therapy.  Evaluation of Outcomes: Adequate for Discharge   Progress in Treatment: Attending groups: Yes Participating in groups: Yes Taking medication as prescribed: Yes, MD continues to assess for medication changes as needed Toleration medication: Yes, no side effects reported at this time Family/Significant other contact made: No, patient declined.  Patient understands diagnosis: Limited insight  Discussing patient identified problems/goals with staff: Yes Medical problems stabilized or resolved: Yes Denies suicidal/homicidal ideation: Yes  Issues/concerns per patient self-inventory: None Other: N/A  New problem(s) identified: None identified at this time.   New Short Term/Long Term Goal(s): None identified at this time.   Discharge Plan or Barriers: Follow up with [facility]   Reason for Continuation of Hospitalization:  Estimated Length of Stay: D/C today  Attendees: Patient: 08/27/2016  10:30 AM  Physician: Dr. Shea Evans 08/27/2016  10:30 AM  Nursing: Otilio Carpen , RN  08/27/2016  10:30 AM  RN Care Manager: Lars Pinks 08/27/2016  10:30 AM  Social Worker: Ripley Fraise, LCSW 08/27/2016  10:30 AM  Recreational Therapist: Winfield Cunas 08/27/2016  10:30 AM  Other: Radonna Ricker, Social Work Intern  08/27/2016  10:30 AM  Other:  08/27/2016  10:30 AM  Other: 08/27/2016  10:30 AM    Scribe for Treatment Team: Ripley Fraise 08/27/2016 10:30 AM

## 2016-08-27 NOTE — Progress Notes (Signed)
Pt did attend Karaoke group in the cafeteria. Pt participated during this group. [name] [name]

## 2016-08-27 NOTE — Progress Notes (Signed)
  [facility] Adult Case Management Discharge Plan :  Will you be returning to the same living situation after discharge:  No. At discharge, do you have transportation home?: Yes,  cab Do you have the ability to pay for your medications: Yes,  MCD  Release of information consent forms completed and in the chart;  Patient's signature needed at discharge.  Patient to Follow up at: Follow-up Information    [facility] Follow up on 08/31/2016.   Specialty:  Behavioral Health Why:  Tuesday at 2:25PM with Vara Guardian information: [place] [address] [phone]        [facility] Follow up.   Contact information: [address]     [address] [place] [address] [phone]       Woody Seller, MD. Go in 1 week(s).   Specialty:  Family Medicine Why:  follow up on your [facility] Contact information: [address] [place] [address] [phone]           Next level of care provider has access to [place] and Suicide Prevention discussed: Yes,  yes  Have you used any form of tobacco in the last 30 days? (Cigarettes, Smokeless Tobacco, Cigars, and/or Pipes): Yes  Has patient been referred to the Quitline?: Patient refused referral  Patient has been referred for addiction treatment: [place] 08/27/2016, 12:26 PM

## 2016-08-27 NOTE — Plan of Care (Signed)
Problem: [facility] Participation in Recreation Therapeutic Interventions Goal: STG-Patient will attend/participate in Rec Therapy Group Ses STG-The Patient will attend and participate in Recreation Therapy Group Sessions  Outcome: Completed/Met Date Met: 08/27/16 Pt attended and participated in wellness, coping skills and goal setting recreation therapy sessions.  [name], LRT/CTRS

## 2016-08-27 NOTE — Progress Notes (Signed)
Patient ID: [name] [name], male   DOB: 09-Jul-1955, 61 y.o.   MRN: [id number] D: Client visible on the unit, loud, pressured speech, continues to talk about going home. A: Writer provided emotional support, encouraged client to discuss discharge plans with physician. Medications reviewed, administered as ordered. Staff will monitor q15min for safety. R: client is safe on the unit, attended karaoke and participated. Client talked about the songs he sang.

## 2016-08-27 NOTE — Progress Notes (Signed)
Recreation Therapy Notes  Date: 08/27/16 Time: 1000 Location: 300 Hall Dayroom  Group Topic: Communication, Team Building, Problem Solving  Goal Area(s) Addresses:  Patient will effectively work with peer towards shared goal.  Patient will identify skills used to make activity successful.  Patient will identify how skills used during activity can be used to reach post d/c goals.   Intervention: STEM Activity  Activity: Geophysicist/field seismologist. In teams patients were given 12 plastic drinking straws and a length of masking tape. Using the materials provided patients were asked to build a landing pad to catch a golf ball dropped from approximately 6 feet in the air.   Education: Education officer, community, Discharge Planning   Education Outcome: Acknowledges education/In group clarification offered/Needs additional education.   Clinical Observations/Feedback: Pt did not attend group.   [name], LRT/CTRS         [name] A 08/27/2016 12:14 PM

## 2016-08-27 NOTE — BHH Suicide Risk Assessment (Signed)
[facility] Discharge Suicide Risk Assessment   Principal Problem: Bipolar disorder, current episode manic severe with psychotic features [facility]) Discharge Diagnoses:  Patient Active Problem List   Diagnosis Date Noted  . Seizure-like activity ([place]) [R56.9] 08/23/2016  . Hyponatremia [E87.1] 08/18/2016  . Alcohol use disorder, moderate, dependence ([place]) [F10.20] 08/18/2016  . Bipolar disorder, current episode manic severe with psychotic features ([place]) [F31.2] 08/18/2016  . Bipolar affective disorder ([place]) [F31.9] 08/16/2016  . Carpal tunnel syndrome on right [G56.01] 02/04/2016  . Umbilical hernia [D32.6] 12/26/2012  . Chest pain [R07.9] 03/31/2012  . GERD (gastroesophageal reflux disease) [K21.9] 03/06/2012  . Hypothyroidism [E03.9] 03/06/2012  . COPD (chronic obstructive pulmonary disease) ([place]) [J44.9] 03/06/2012    Total Time spent with patient: 20 minutes   Spent time discussing with [name] [name].  He presents as continued hypomania. But he was able to engage in a conversation about where he would live, seeking care with [facility], getting to his appointments, and reports that he generally tolerates Seroquel well and feels like it's helping him. He hopes to be able to get back into working some odd jobs for [employer], as he used to work in Contractor business. He denies any suicidal thoughts or homicidal thoughts. He knows that he is hyper and talks fast, but reports that that is his baseline when he is doing well. He plans to stay with a friend here in [place] and is able to provide the address and contact information. He continues to worry about his mom, and hopes to check in on her as she is elderly and demented.  he continues to be preoccupied with some of the interactions he had with his brother, and his family history of mental illness. He is able to be redirected. Denies any intent to harm anybody. He plans to stay away from sources of stress.  Musculoskeletal: Strength & Muscle  Tone: within normal limits Gait & Station: normal Patient leans: N/A  Psychiatric Specialty Exam: ROS  Blood pressure 109/77, pulse (!) 101, temperature 97.8 F (36.6 C), resp. rate 16, height 5\' 5"  (1.651 m), weight 74.8 kg (165 lb), SpO2 99 %.Body mass index is 27.46 kg/m.  General Appearance: Casual and Fairly Groomed  Eye Contact::  Good  Speech:  Clear and Coherent and Pressured409  Volume:  Normal  Mood:  Anxious and Euthymic  Affect:  Congruent  Thought Process:  Coherent and Irrelevant  Orientation:  Full (Time, Place, and Person)  Thought Content:  Tangential  Suicidal Thoughts:  No  Homicidal Thoughts:  No  Memory:  Immediate;   Fair  Judgement:  Fair  Insight:  Present and Shallow  Psychomotor Activity:  Normal  Concentration:  Good  Recall:  Good  Fund of Knowledge:Good  Language: Good  Akathisia:  Negative  Handed:  Right  AIMS (if indicated):     Assets:  Communication Skills Desire for Improvement Housing Talents/Skills Transportation  Sleep:  Number of Hours: 6.5  Cognition: WNL  ADL's:  Intact   Mental Status Per Nursing Assessment::   On Admission:     Demographic Factors:  Male, Divorced or widowed, Low socioeconomic status and Unemployed  Loss Factors: Financial problems/change in socioeconomic status  Historical Factors: Family history of mental illness or substance abuse and Impulsivity  Risk Reduction Factors:   Sense of responsibility to family, Religious beliefs about death, Living with another person, especially a relative and Positive coping skills or problem solving skills  Continued Clinical Symptoms:  Continues baseline hypomania  Cognitive  Features That Contribute To Risk:  None    Suicide Risk:  Minimal: No identifiable suicidal ideation.  Patients presenting with no risk factors but with morbid ruminations; may be classified as minimal risk based on the severity of the depressive symptoms  Follow-up Information     [facility] Follow up on 08/31/2016.   Specialty:  Behavioral Health Why:  Tuesday at 2:25PM with Vara Guardian information: [place] [address] [phone]        [facility] Follow up.   Contact information: [address]     [address] [place] [address] [phone]       Woody Seller, MD. Go in 1 week(s).   Specialty:  Family Medicine Why:  follow up on your [facility] Contact information: [address] [place] [address] [phone]           Plan Of Care/Follow-up recommendations:  Activity:  resume normal Diet:  resume normal  Aundra Dubin, MD 08/27/2016, 12:08 PM

## 2016-08-27 NOTE — Discharge Summary (Signed)
Physician Discharge Summary Note  Patient:  [name] [name] is an 61 y.o., male MRN:  [id number] DOB:  06/30/1955 Patient phone:  [phone] (home)  Patient address:   [place] [address],  Total Time spent with patient: Greater than 30 minutes  Date of Admission:  08/17/2016 Date of Discharge: 08-27-16  Reason for Admission: Severe manic episodes of bipolar disorder.  Principal Problem: Bipolar disorder, current episode manic severe with psychotic features [facility])  Discharge Diagnoses: Patient Active Problem List   Diagnosis Date Noted  . Seizure-like activity ([place]) [R56.9] 08/23/2016  . Hyponatremia [E87.1] 08/18/2016  . Alcohol use disorder, moderate, dependence ([place]) [F10.20] 08/18/2016  . Bipolar disorder, current episode manic severe with psychotic features ([place]) [F31.2] 08/18/2016  . Bipolar affective disorder ([place]) [F31.9] 08/16/2016  . Carpal tunnel syndrome on right [G56.01] 02/04/2016  . Umbilical hernia [I69.6] 12/26/2012  . Chest pain [R07.9] 03/31/2012  . GERD (gastroesophageal reflux disease) [K21.9] 03/06/2012  . Hypothyroidism [E03.9] 03/06/2012  . COPD (chronic obstructive pulmonary disease) ([place]) [J44.9] 03/06/2012   Past Psychiatric History: Bipolar affective disorder, Alcohol use disorder.  Past Medical History:  Past Medical History:  Diagnosis Date  . Anxiety   . Arthritis   . Asthma   . Bipolar 1 disorder ([place])   . Carpal tunnel syndrome on right 02/04/2016  . Chronic insomnia   . COPD (chronic obstructive pulmonary disease) ([place])   . Depression   . Diaphragmatic hernia   . Dizziness   . ED (erectile dysfunction)   . GERD (gastroesophageal reflux disease)   . HLD (hyperlipidemia)   . Hypothyroidism   . SOB (shortness of breath)   . Tobacco use   . Umbilical hernia without obstruction and without gangrene     Past Surgical History:  Procedure Laterality Date  . COLONOSCOPY    . INSERTION OF MESH N/A 01/01/2013    Procedure: INSERTION OF MESH;  Surgeon: Merrie Roof, MD;  Location: [facility];  Service: General;  Laterality: N/A;  . UMBILICAL HERNIA REPAIR N/A 01/01/2013   Procedure: HERNIA REPAIR UMBILICAL ADULT;  Surgeon: Merrie Roof, MD;  Location: [facility];  Service: General;  Laterality: N/A;  . UPPER GI ENDOSCOPY     Family History:  Family History  Problem Relation Age of Onset  . Arrhythmia    . ALS    . Prostate cancer Maternal Grandfather   . Colon cancer Paternal Grandfather   . Heart disease Mother   . Arthritis Mother   . Mental illness Brother    Family Psychiatric  History: Bipolar affective disorder, Alcohol use disorder.  Social History:  History  Alcohol Use  . Yes    Comment: "Never, hardly one at all"     History  Drug Use No    Social History   Social History  . Marital status: Single    Spouse name: N/A  . Number of children: 0  . Years of education: N/A   Occupational History  . disabled    Social History Main Topics  . Smoking status: Current Every Day Smoker    Packs/day: 2.00    Years: 40.00    Types: Cigarettes  . Smokeless tobacco: Never Used  . Alcohol use Yes     Comment: "Never, hardly one at all"  . Drug use: No  . Sexual activity: Not Asked   Other Topics Concern  . None   Social History Narrative  . None   Hospital  Course:  [name] [name] is a 34 y old single, Caucasian male , is on SSD, used to live with mother, has a hx of bipolar do as well as alcohol abuse , who presented IVC ed for worsening mania . Per initial notes in EHR " Pt took out a $ 10,000 life insurance policy on his mother, monthly payments were being withdrawn from mother's account and patient is the sole beneficiary . Pt also charged $800:00 on mother's credit card , constantly asks mom for money , if she cannot give him money , pt gets verbally abusive, makes threats . Pt gets a check every month, he got one recently , but is already broke . Per brother , pt is spending all his  money and he does not know how. Per brother , he is going to take out a 59 B on patient so that he cannot return home to him and his mother."  Since his arrival to the [facility] adult unit & with initiation of medication regimen for his presenting symptoms, [name] [name]'s symptoms gradually responded to his treatment regimen from mania to hypomania. This is evidenced by his continued pressured speech, restlessness & often seen pacing the hall-way.  He was often seen making exaggerated statements, speaking loudly, non-stop & overly friendly & intrusive. He was enrolled in the group counseling sessions, however, [name] [name] was never present due to his need for constant movement & talkativeness, very easily distracted. Staff reports  periods of grandiosity on the part of the patient & continued to provide redirection to reality.  During the course of his hospitalization, [name] [name] was medicated & discharged on; Hydroxyzine 25 mg prn for anxiety, Seroquel 12.5 mg for agitation, Seroquel 400 mg for mood control, Lamictal 25 mg for mood stabilization & Trazodone 100 mg for insomnia. [name] [name] also presented other pre-existing medical issues that required treatment. He was resumed on all his pertinent home medications for those health issues. He tolerated his treatment regimen without any adverse effects or reactions reported.  [name] [name]'s presenting symptoms somewhat responded to the treatment regimen. However, he remained hypomanic, which at this time can said to be his baseline. The psychiatrist spent time  today discussing with [name] [name].  He presents as continued hypomania. But he was able to engage in a conversation about where he would live, seeking care with [facility], getting to his appointments, and reports that he generally tolerates Seroquel well and feels like it's helping him. He hopes to be able to get back into working some odd jobs for [employer], as he used to work in Contractor business. He denies any suicidal thoughts or  homicidal thoughts. He knows that he is hyper and talks fast, but reports that that is his baseline when he is doing well. He plans to stay with a friend here in [place] and is able to provide the address and contact information. He continues to worry about his mom, and hopes to check in on her as she is elderly and demented.  he continues to be preoccupied with some of the interactions he had with his brother, and his family history of mental illness. He is able to be redirected. Denies any intent to harm anybody. He plans to stay away from sources of stress.  Jaxtyn at this time is considered mentally & medically stable to be discharged to continue mental health care on an outpatient basis as noted below. Upon discharge, he adamantly denies any SIHI, AVH, delusional thoughts or paranoia. He left [facility] with all personal  belongings in no apparent distress. Transportation per [employer] bus. [employer] assisted with bus pass.  Physical Findings: AIMS: Facial and Oral Movements Muscles of Facial Expression: None, normal Lips and Perioral Area: None, normal Jaw: None, normal Tongue: None, normal,Extremity Movements Upper (arms, wrists, hands, fingers): None, normal Lower (legs, knees, ankles, toes): None, normal, Trunk Movements Neck, shoulders, hips: None, normal, Overall Severity Severity of abnormal movements (highest score from questions above): None, normal Incapacitation due to abnormal movements: None, normal Patient's awareness of abnormal movements (rate only patient's report): No Awareness, Dental Status Current problems with teeth and/or dentures?: Yes Does patient usually wear dentures?: Yes  CIWA:  CIWA-Ar Total: 2 COWS:     Musculoskeletal: Strength & Muscle Tone: within normal limits Gait & Station: normal Patient leans: N/A  Psychiatric Specialty Exam: Physical Exam  Constitutional: He is oriented to person, place, and time. He appears well-developed.  HENT:  Head: Normocephalic.   Eyes: Pupils are equal, round, and reactive to light.  Neck: Normal range of motion.  Cardiovascular: Normal rate.   Respiratory: Effort normal.  GI: Soft.  Genitourinary:  Genitourinary Comments: Deferred  Musculoskeletal: Normal range of motion.  Neurological: He is alert and oriented to person, place, and time.  Skin: Skin is warm and dry.    Review of Systems  Constitutional: Negative.   HENT: Negative.   Eyes: Negative.   Respiratory: Negative.   Cardiovascular: Negative.   Gastrointestinal: Negative.   Genitourinary: Negative.   Musculoskeletal: Negative.   Skin: Negative.   Neurological: Negative.   Endo/Heme/Allergies: Negative.   Psychiatric/Behavioral: Positive for depression (Stable). Negative for memory loss, substance abuse and suicidal ideas. The patient has insomnia (Stable). The patient is not nervous/anxious.     Blood pressure 109/77, pulse (!) 101, temperature 97.8 F (36.6 C), resp. rate 16, height 5\' 5"  (1.651 m), weight 74.8 kg (165 lb), SpO2 99 %.Body mass index is 27.46 kg/m.  See Md's SRA   Have you used any form of tobacco in the last 30 days? (Cigarettes, Smokeless Tobacco, Cigars, and/or Pipes): Yes  Has this patient used any form of tobacco in the last 30 days? (Cigarettes, Smokeless Tobacco, Cigars, and/or Pipes):No  Blood Alcohol level:  Lab Results  Component Value Date   ETH <5 08/20/2016   ETH <5 16/60/6301   Metabolic Disorder Labs:  No results found for: HGBA1C, MPG No results found for: PROLACTIN No results found for: CHOL, TRIG, HDL, CHOLHDL, VLDL, LDLCALC  See Psychiatric Specialty Exam and Suicide Risk Assessment completed by Attending Physician prior to discharge.  Discharge destination:  Home  Is patient on multiple antipsychotic therapies at discharge:  No   Has Patient had three or more failed trials of antipsychotic monotherapy by history:  No  Recommended Plan for Multiple Antipsychotic Therapies: NA  Allergies as  of 08/27/2016      Reactions   Codeine Nausea And Vomiting   Nausea/vomiting   Lipitor [atorvastatin] Other (See Comments)   Abdominal pains, Leg cramps   Lithium Carbonate [lithium] Other (See Comments)   Mental Status Changes   Pollen Extract Itching   Itching Eyes & Congestion   Talwin [pentazocine] Other (See Comments)   Hallucination, shaking      Medication List    STOP taking these medications   albuterol 108 (90 Base) MCG/ACT inhaler Commonly known as:  PROVENTIL HFA;VENTOLIN HFA   clonazePAM 1 MG tablet Commonly known as:  KLONOPIN   dexamethasone 4 MG tablet Commonly known as:  DECADRON  diphenhydrAMINE 25 MG tablet Commonly known as:  BENADRYL   divalproex 250 MG DR tablet Commonly known as:  DEPAKOTE   esomeprazole 40 MG capsule Commonly known as:  NEXIUM   famotidine 20 MG tablet Commonly known as:  PEPCID   fluticasone 50 MCG/ACT nasal spray Commonly known as:  FLONASE   hydrocortisone 2.5 % lotion   ipratropium-albuterol 0.5-2.5 (3) MG/3ML Soln Commonly known as:  DUONEB   predniSONE 20 MG tablet Commonly known as:  DELTASONE   risperiDONE 2 MG tablet Commonly known as:  RISPERDAL     TAKE these medications     Indication  hydrOXYzine 25 MG tablet Commonly known as:  ATARAX/VISTARIL Take 1 tablet (25 mg total) by mouth every 6 (six) hours as needed for anxiety.  Indication:  Anxiety Neurosis   lamoTRIgine 25 MG tablet Commonly known as:  LAMICTAL Take 1 tablet (25 mg total) by mouth 2 (two) times daily. For mood stabilization  Indication:  Mood stabilization   levothyroxine 150 MCG tablet Commonly known as:  SYNTHROID, LEVOTHROID Take 1 tablet (150 mcg total) by mouth every morning. For thyroid hormone replacement What changed:  additional instructions  Indication:  Underactive Thyroid   lidocaine 5 % Commonly known as:  LIDODERM Place 1 patch onto the skin daily. Remove & Discard patch within 12 hours or as directed by MD: For  pain  Indication:  Pain   pantoprazole 40 MG tablet Commonly known as:  PROTONIX Take 1 tablet (40 mg total) by mouth 2 (two) times daily before a meal. For acid reflux  Indication:  Gastroesophageal Reflux Disease   QUEtiapine 25 MG tablet Commonly known as:  SEROQUEL Take 1 tablet (12.5 mg) after breakfast & 1 tablet (12.5) after lunch: For agitation What changed:  medication strength  how much to take  how to take this  when to take this  additional instructions  Indication:  Agitation   QUEtiapine 400 MG tablet Commonly known as:  SEROQUEL Take 1 tablet (400 mg total) by mouth at bedtime. For mood control What changed:  You were already taking a medication with the same name, and this prescription was added. Make sure you understand how and when to take each.  Indication:  Mood control   traZODone 100 MG tablet Commonly known as:  DESYREL Take 1 tablet (100 mg total) by mouth at bedtime as needed for sleep. What changed:  when to take this  reasons to take this  Indication:  Trouble Sleeping      Follow-up Information    [facility] Follow up on 08/31/2016.   Specialty:  Behavioral Health Why:  Tuesday at 2:25PM with Vara Guardian information: [place] [address] [phone]        [facility] Follow up.   Contact information: [address]     [address] [place] [address] [phone]       Woody Seller, MD. Go in 1 week(s).   Specialty:  Family Medicine Why:  follow up on your [facility] Contact information: [address] [place] [address] [phone]          Follow-up recommendations: Activity:  As tolerated Diet: As recommended by your primary care doctor. Keep all scheduled follow-up appointments as recommended.  Comments: Patient is instructed prior to discharge to: Take all medications as prescribed by his/her mental healthcare provider. Report any adverse effects and  or reactions from the medicines to his/her outpatient provider promptly. Patient has  been instructed & cautioned: To not engage in alcohol and or illegal drug use while on prescription medicines. In the event of worsening symptoms, patient is instructed to call the crisis hotline, 911 and or go to the nearest ED for appropriate evaluation and treatment of symptoms.  Signed: Encarnacion Slates, NP, PMHNP, FNP-BC 08/27/2016, 10:55 AM

## 2016-08-27 NOTE — Progress Notes (Signed)
Data. Patient denies SI/HI/AVH. Patient 's speech continues to be pressured and he is fixated on his mother and his broth, "Like a Stage manager of my mother his his hand up her ass." Patient affect is blunt and does brighten at times with interaction.  Action. Emotional support and encouragement offered. Education provided on medication, indications and side effect. Q 15 minute checks done for safety. Response. Safety on the unit maintained through 15 minute checks.  Medications taken as prescribed. Attended groups. Remained calm  through out shift.  Pt. discharged to lobby.  Belongings sheet reviewed and signed by pt. and all belongings sent home, including bus pass. Paperwork reviewed and pt. able to verbalize understanding of education. Pt. in no current distress and ambulatory.

## 2017-05-14 ENCOUNTER — Other Ambulatory Visit: Payer: Self-pay

## 2017-05-14 ENCOUNTER — Emergency Department (HOSPITAL_COMMUNITY): Payer: Medicaid Other

## 2017-05-14 ENCOUNTER — Encounter (HOSPITAL_COMMUNITY): Payer: Self-pay

## 2017-05-14 ENCOUNTER — Emergency Department (HOSPITAL_COMMUNITY)
Admission: EM | Admit: 2017-05-14 | Discharge: 2017-05-14 | Disposition: A | Discharge status: Home / Self Care | Payer: Medicaid Other | Attending: Emergency Medicine | Admitting: Emergency Medicine

## 2017-05-14 DIAGNOSIS — M25561 Pain in right knee: Secondary | ICD-10-CM | POA: Diagnosis present

## 2017-05-14 DIAGNOSIS — Z5321 Procedure and treatment not carried out due to patient leaving prior to being seen by health care provider: Secondary | ICD-10-CM | POA: Insufficient documentation

## 2017-05-14 NOTE — ED Triage Notes (Signed)
PT RECEIVED FROM HOME VIA EMS C/O RIGHT KNEE PAIN AND SWELLING X 1 WEEK. PT STS THIS IS A CHRONIC, BUT HE HAS NOT BEEN ABLE TO BEAR WEIGHT.

## 2017-05-14 NOTE — ED Notes (Signed)
PT CALLED X1 FOR XRAY, NO RESPONSE.

## 2017-05-14 NOTE — ED Notes (Signed)
Pt has not answered to name for over an hour.

## 2017-05-14 NOTE — ED Notes (Signed)
Pt called from the lobby with no response 

## 2019-02-23 ENCOUNTER — Inpatient Hospital Stay (HOSPITAL_COMMUNITY)
Admission: EM | Admit: 2019-02-23 | Discharge: 2019-02-24 | DRG: 638 | Discharge status: Against Medical Advice | Payer: Medicaid Other | Attending: Internal Medicine | Admitting: Internal Medicine

## 2019-02-23 ENCOUNTER — Encounter (HOSPITAL_COMMUNITY): Payer: Self-pay

## 2019-02-23 ENCOUNTER — Other Ambulatory Visit: Payer: Self-pay

## 2019-02-23 ENCOUNTER — Inpatient Hospital Stay (HOSPITAL_COMMUNITY): Payer: Medicaid Other

## 2019-02-23 DIAGNOSIS — Z888 Allergy status to other drugs, medicaments and biological substances status: Secondary | ICD-10-CM

## 2019-02-23 DIAGNOSIS — Z5329 Procedure and treatment not carried out because of patient's decision for other reasons: Secondary | ICD-10-CM | POA: Diagnosis not present

## 2019-02-23 DIAGNOSIS — E875 Hyperkalemia: Secondary | ICD-10-CM | POA: Diagnosis present

## 2019-02-23 DIAGNOSIS — Z818 Family history of other mental and behavioral disorders: Secondary | ICD-10-CM

## 2019-02-23 DIAGNOSIS — J449 Chronic obstructive pulmonary disease, unspecified: Secondary | ICD-10-CM | POA: Diagnosis present

## 2019-02-23 DIAGNOSIS — F17210 Nicotine dependence, cigarettes, uncomplicated: Secondary | ICD-10-CM | POA: Diagnosis present

## 2019-02-23 DIAGNOSIS — F5104 Psychophysiologic insomnia: Secondary | ICD-10-CM | POA: Diagnosis present

## 2019-02-23 DIAGNOSIS — F419 Anxiety disorder, unspecified: Secondary | ICD-10-CM | POA: Diagnosis present

## 2019-02-23 DIAGNOSIS — E785 Hyperlipidemia, unspecified: Secondary | ICD-10-CM | POA: Diagnosis present

## 2019-02-23 DIAGNOSIS — Z79890 Hormone replacement therapy (postmenopausal): Secondary | ICD-10-CM | POA: Diagnosis not present

## 2019-02-23 DIAGNOSIS — Z833 Family history of diabetes mellitus: Secondary | ICD-10-CM | POA: Diagnosis not present

## 2019-02-23 DIAGNOSIS — E039 Hypothyroidism, unspecified: Secondary | ICD-10-CM | POA: Diagnosis present

## 2019-02-23 DIAGNOSIS — Z79899 Other long term (current) drug therapy: Secondary | ICD-10-CM | POA: Diagnosis not present

## 2019-02-23 DIAGNOSIS — Z794 Long term (current) use of insulin: Secondary | ICD-10-CM | POA: Diagnosis not present

## 2019-02-23 DIAGNOSIS — N179 Acute kidney failure, unspecified: Secondary | ICD-10-CM | POA: Diagnosis present

## 2019-02-23 DIAGNOSIS — E860 Dehydration: Secondary | ICD-10-CM | POA: Diagnosis present

## 2019-02-23 DIAGNOSIS — F1011 Alcohol abuse, in remission: Secondary | ICD-10-CM | POA: Diagnosis present

## 2019-02-23 DIAGNOSIS — T380X5A Adverse effect of glucocorticoids and synthetic analogues, initial encounter: Secondary | ICD-10-CM | POA: Diagnosis present

## 2019-02-23 DIAGNOSIS — Z20828 Contact with and (suspected) exposure to other viral communicable diseases: Secondary | ICD-10-CM | POA: Diagnosis present

## 2019-02-23 DIAGNOSIS — E1110 Type 2 diabetes mellitus with ketoacidosis without coma: Secondary | ICD-10-CM | POA: Diagnosis present

## 2019-02-23 DIAGNOSIS — F312 Bipolar disorder, current episode manic severe with psychotic features: Secondary | ICD-10-CM | POA: Diagnosis present

## 2019-02-23 DIAGNOSIS — Z885 Allergy status to narcotic agent status: Secondary | ICD-10-CM

## 2019-02-23 DIAGNOSIS — E119 Type 2 diabetes mellitus without complications: Secondary | ICD-10-CM

## 2019-02-23 DIAGNOSIS — K219 Gastro-esophageal reflux disease without esophagitis: Secondary | ICD-10-CM | POA: Diagnosis present

## 2019-02-23 DIAGNOSIS — E1165 Type 2 diabetes mellitus with hyperglycemia: Secondary | ICD-10-CM | POA: Diagnosis present

## 2019-02-23 DIAGNOSIS — R631 Polydipsia: Secondary | ICD-10-CM | POA: Diagnosis not present

## 2019-02-23 LAB — CBG MONITORING, ED
Glucose-Capillary: 171 mg/dL — ABNORMAL HIGH (ref 70–99)
Glucose-Capillary: 273 mg/dL — ABNORMAL HIGH (ref 70–99)
Glucose-Capillary: 309 mg/dL — ABNORMAL HIGH (ref 70–99)
Glucose-Capillary: 441 mg/dL — ABNORMAL HIGH (ref 70–99)
Glucose-Capillary: 480 mg/dL — ABNORMAL HIGH (ref 70–99)
Glucose-Capillary: >600 mg/dL (ref 70–99)

## 2019-02-23 LAB — CBC WITH DIFFERENTIAL/PLATELET
Abs Immature Granulocytes: 0.06 10*3/uL (ref 0.00–0.07)
Basophils Absolute: 0 10*3/uL (ref 0.0–0.1)
Basophils Relative: 0 %
Eosinophils Absolute: 0.1 10*3/uL (ref 0.0–0.5)
Eosinophils Relative: 1 %
HCT: 46.3 % (ref 39.0–52.0)
Hemoglobin: 15.6 g/dL (ref 13.0–17.0)
Immature Granulocytes: 1 %
Lymphocytes Relative: 37 %
Lymphs Abs: 4.9 10*3/uL — ABNORMAL HIGH (ref 0.7–4.0)
MCH: 29.8 pg (ref 26.0–34.0)
MCHC: 33.7 g/dL (ref 30.0–36.0)
MCV: 88.5 fL (ref 80.0–100.0)
Monocytes Absolute: 0.9 10*3/uL (ref 0.1–1.0)
Monocytes Relative: 7 %
Neutro Abs: 7.2 10*3/uL (ref 1.7–7.7)
Neutrophils Relative %: 54 %
Platelets: 273 10*3/uL (ref 150–400)
RBC: 5.23 MIL/uL (ref 4.22–5.81)
RDW: 13.4 % (ref 11.5–15.5)
WBC: 13.2 10*3/uL — ABNORMAL HIGH (ref 4.0–10.5)
nRBC: 0 % (ref 0.0–0.2)

## 2019-02-23 LAB — BASIC METABOLIC PANEL
Anion gap: 28 — ABNORMAL HIGH (ref 5–15)
Anion gap: 21 — ABNORMAL HIGH (ref 5–15)
Anion gap: 18 — ABNORMAL HIGH (ref 5–15)
Anion gap: 16 — ABNORMAL HIGH (ref 5–15)
Anion gap: 12 (ref 5–15)
Anion gap: 8 (ref 5–15)
BUN: 44 mg/dL — ABNORMAL HIGH (ref 8–23)
BUN: 38 mg/dL — ABNORMAL HIGH (ref 8–23)
BUN: 34 mg/dL — ABNORMAL HIGH (ref 8–23)
BUN: 31 mg/dL — ABNORMAL HIGH (ref 8–23)
BUN: 25 mg/dL — ABNORMAL HIGH (ref 8–23)
BUN: 22 mg/dL (ref 8–23)
CO2: 8 mmol/L — ABNORMAL LOW (ref 22–32)
CO2: 13 mmol/L — ABNORMAL LOW (ref 22–32)
CO2: 13 mmol/L — ABNORMAL LOW (ref 22–32)
CO2: 16 mmol/L — ABNORMAL LOW (ref 22–32)
CO2: 18 mmol/L — ABNORMAL LOW (ref 22–32)
CO2: 19 mmol/L — ABNORMAL LOW (ref 22–32)
Calcium: 8.8 mg/dL — ABNORMAL LOW (ref 8.9–10.3)
Calcium: 8.5 mg/dL — ABNORMAL LOW (ref 8.9–10.3)
Calcium: 8.4 mg/dL — ABNORMAL LOW (ref 8.9–10.3)
Calcium: 8.4 mg/dL — ABNORMAL LOW (ref 8.9–10.3)
Calcium: 8.4 mg/dL — ABNORMAL LOW (ref 8.9–10.3)
Calcium: 8.2 mg/dL — ABNORMAL LOW (ref 8.9–10.3)
Chloride: 88 mmol/L — ABNORMAL LOW (ref 98–111)
Chloride: 99 mmol/L (ref 98–111)
Chloride: 102 mmol/L (ref 98–111)
Chloride: 101 mmol/L (ref 98–111)
Chloride: 103 mmol/L (ref 98–111)
Chloride: 104 mmol/L (ref 98–111)
Creatinine, Ser: 1.52 mg/dL — ABNORMAL HIGH (ref 0.61–1.24)
Creatinine, Ser: 1.26 mg/dL — ABNORMAL HIGH (ref 0.61–1.24)
Creatinine, Ser: 0.94 mg/dL (ref 0.61–1.24)
Creatinine, Ser: 0.96 mg/dL (ref 0.61–1.24)
Creatinine, Ser: 0.84 mg/dL (ref 0.61–1.24)
Creatinine, Ser: 0.88 mg/dL (ref 0.61–1.24)
GFR calc Af Amer: 56 mL/min — ABNORMAL LOW (ref >60)
GFR calc Af Amer: >60 mL/min (ref >60)
GFR calc Af Amer: >60 mL/min (ref >60)
GFR calc Af Amer: >60 mL/min (ref >60)
GFR calc Af Amer: >60 mL/min (ref >60)
GFR calc Af Amer: >60 mL/min (ref >60)
GFR calc non Af Amer: 48 mL/min — ABNORMAL LOW (ref >60)
GFR calc non Af Amer: >60 mL/min (ref >60)
GFR calc non Af Amer: >60 mL/min (ref >60)
GFR calc non Af Amer: >60 mL/min (ref >60)
GFR calc non Af Amer: >60 mL/min (ref >60)
GFR calc non Af Amer: >60 mL/min (ref >60)
Glucose, Bld: 672 mg/dL (ref 70–99)
Glucose, Bld: 266 mg/dL — ABNORMAL HIGH (ref 70–99)
Glucose, Bld: 183 mg/dL — ABNORMAL HIGH (ref 70–99)
Glucose, Bld: 219 mg/dL — ABNORMAL HIGH (ref 70–99)
Glucose, Bld: 154 mg/dL — ABNORMAL HIGH (ref 70–99)
Glucose, Bld: 138 mg/dL — ABNORMAL HIGH (ref 70–99)
Potassium: 5.2 mmol/L — ABNORMAL HIGH (ref 3.5–5.1)
Potassium: 3.8 mmol/L (ref 3.5–5.1)
Potassium: 3.8 mmol/L (ref 3.5–5.1)
Potassium: 3.2 mmol/L — ABNORMAL LOW (ref 3.5–5.1)
Potassium: 3.7 mmol/L (ref 3.5–5.1)
Potassium: 3.9 mmol/L (ref 3.5–5.1)
Sodium: 124 mmol/L — ABNORMAL LOW (ref 135–145)
Sodium: 133 mmol/L — ABNORMAL LOW (ref 135–145)
Sodium: 133 mmol/L — ABNORMAL LOW (ref 135–145)
Sodium: 133 mmol/L — ABNORMAL LOW (ref 135–145)
Sodium: 133 mmol/L — ABNORMAL LOW (ref 135–145)
Sodium: 131 mmol/L — ABNORMAL LOW (ref 135–145)

## 2019-02-23 LAB — GLUCOSE, CAPILLARY
Glucose-Capillary: 193 mg/dL — ABNORMAL HIGH (ref 70–99)
Glucose-Capillary: 253 mg/dL — ABNORMAL HIGH (ref 70–99)
Glucose-Capillary: 231 mg/dL — ABNORMAL HIGH (ref 70–99)
Glucose-Capillary: 200 mg/dL — ABNORMAL HIGH (ref 70–99)
Glucose-Capillary: 180 mg/dL — ABNORMAL HIGH (ref 70–99)
Glucose-Capillary: 166 mg/dL — ABNORMAL HIGH (ref 70–99)
Glucose-Capillary: 154 mg/dL — ABNORMAL HIGH (ref 70–99)
Glucose-Capillary: 170 mg/dL — ABNORMAL HIGH (ref 70–99)
Glucose-Capillary: 181 mg/dL — ABNORMAL HIGH (ref 70–99)
Glucose-Capillary: 165 mg/dL — ABNORMAL HIGH (ref 70–99)
Glucose-Capillary: 134 mg/dL — ABNORMAL HIGH (ref 70–99)
Glucose-Capillary: 154 mg/dL — ABNORMAL HIGH (ref 70–99)

## 2019-02-23 LAB — HIV ANTIBODY (ROUTINE TESTING W REFLEX): HIV Screen 4th Generation wRfx: NONREACTIVE

## 2019-02-23 LAB — BLOOD GAS, VENOUS
Acid-base deficit: 15.7 mmol/L — ABNORMAL HIGH (ref 0.0–2.0)
Bicarbonate: 10.8 mmol/L — ABNORMAL LOW (ref 20.0–28.0)
O2 Saturation: 67.3 %
Patient temperature: 98.6
pCO2, Ven: 27.5 mmHg — ABNORMAL LOW (ref 44.0–60.0)
pH, Ven: 7.218 — ABNORMAL LOW (ref 7.250–7.430)
pO2, Ven: 43.2 mmHg (ref 32.0–45.0)

## 2019-02-23 LAB — ETHANOL: Alcohol, Ethyl (B): <10 mg/dL (ref <10)

## 2019-02-23 LAB — URINALYSIS, ROUTINE W REFLEX MICROSCOPIC
Bilirubin Urine: NEGATIVE
Glucose, UA: >=500 mg/dL — AB
Ketones, ur: 80 mg/dL — AB
Leukocytes,Ua: NEGATIVE
Nitrite: NEGATIVE
Protein, ur: NEGATIVE mg/dL
Specific Gravity, Urine: 1.024 (ref 1.005–1.030)
pH: 5 (ref 5.0–8.0)

## 2019-02-23 LAB — CBC
HCT: 47 % (ref 39.0–52.0)
Hemoglobin: 16.1 g/dL (ref 13.0–17.0)
MCH: 30.1 pg (ref 26.0–34.0)
MCHC: 34.3 g/dL (ref 30.0–36.0)
MCV: 88 fL (ref 80.0–100.0)
Platelets: 284 10*3/uL (ref 150–400)
RBC: 5.34 MIL/uL (ref 4.22–5.81)
RDW: 13.3 % (ref 11.5–15.5)
WBC: 10.6 10*3/uL — ABNORMAL HIGH (ref 4.0–10.5)
nRBC: 0 % (ref 0.0–0.2)

## 2019-02-23 LAB — MRSA PCR SCREENING: MRSA by PCR: POSITIVE — AB

## 2019-02-23 LAB — HEMOGLOBIN A1C
Hgb A1c MFr Bld: 12.5 % — ABNORMAL HIGH (ref 4.8–5.6)
Mean Plasma Glucose: 312.05 mg/dL

## 2019-02-23 LAB — SARS CORONAVIRUS 2 (TAT 6-24 HRS): SARS Coronavirus 2: NEGATIVE

## 2019-02-23 LAB — SALICYLATE LEVEL: Salicylate Lvl: <7 mg/dL (ref 2.8–30.0)

## 2019-02-23 MED ORDER — DEXTROSE-NACL 5-0.45 % IV SOLN: INTRAVENOUS | Status: DC

## 2019-02-23 MED ORDER — INSULIN ASPART 100 UNIT/ML ~~LOC~~ SOLN: 0.0000 [IU] | Freq: Every day | SUBCUTANEOUS | Status: DC

## 2019-02-23 MED ORDER — INSULIN STARTER KIT- PEN NEEDLES (ENGLISH)
1.0000 | Freq: Once | Status: AC
  Administered 2019-02-23: 1
  Filled 2019-02-23: qty 1

## 2019-02-23 MED ORDER — INSULIN GLARGINE 100 UNIT/ML ~~LOC~~ SOLN
15.0000 [IU] | Freq: Every day | SUBCUTANEOUS | Status: DC
  Administered 2019-02-23 – 2019-02-24 (×2): 15 [IU] via SUBCUTANEOUS
  Filled 2019-02-23 (×2): qty 0.15

## 2019-02-23 MED ORDER — CLONAZEPAM 1 MG PO TABS
2.0000 mg | ORAL_TABLET | Freq: Every day | ORAL | Status: DC
  Administered 2019-02-23: 23:00:00 2 mg via ORAL
  Filled 2019-02-23: qty 2

## 2019-02-23 MED ORDER — INSULIN ASPART 100 UNIT/ML ~~LOC~~ SOLN: 4.0000 [IU] | Freq: Three times a day (TID) | SUBCUTANEOUS | Status: DC

## 2019-02-23 MED ORDER — INSULIN ASPART 100 UNIT/ML ~~LOC~~ SOLN
0.0000 [IU] | Freq: Three times a day (TID) | SUBCUTANEOUS | Status: DC
  Administered 2019-02-24: 8 [IU] via SUBCUTANEOUS

## 2019-02-23 MED ORDER — ALBUTEROL SULFATE HFA 108 (90 BASE) MCG/ACT IN AERS: 1.0000 | INHALATION_SPRAY | RESPIRATORY_TRACT | Status: DC | PRN

## 2019-02-23 MED ORDER — DEXTROSE-NACL 5-0.45 % IV SOLN
INTRAVENOUS | Status: DC
  Administered 2019-02-23: 10:00:00 via INTRAVENOUS

## 2019-02-23 MED ORDER — ALBUTEROL SULFATE (2.5 MG/3ML) 0.083% IN NEBU: 2.5000 mg | INHALATION_SOLUTION | RESPIRATORY_TRACT | Status: DC | PRN

## 2019-02-23 MED ORDER — POTASSIUM CHLORIDE 10 MEQ/100ML IV SOLN
10.0000 meq | INTRAVENOUS | Status: AC
  Administered 2019-02-23 (×6): 10 meq via INTRAVENOUS
  Filled 2019-02-23 (×6): qty 100

## 2019-02-23 MED ORDER — SODIUM CHLORIDE 0.9 % IV BOLUS
1000.0000 mL | Freq: Once | INTRAVENOUS | Status: AC
  Administered 2019-02-23: 1000 mL via INTRAVENOUS

## 2019-02-23 MED ORDER — ROSUVASTATIN CALCIUM 10 MG PO TABS
10.0000 mg | ORAL_TABLET | Freq: Every day | ORAL | Status: DC
  Administered 2019-02-23 – 2019-02-24 (×2): 10 mg via ORAL
  Filled 2019-02-23 (×3): qty 1

## 2019-02-23 MED ORDER — LEVOTHYROXINE SODIUM 75 MCG PO TABS
150.0000 ug | ORAL_TABLET | Freq: Every day | ORAL | Status: DC
  Administered 2019-02-23 – 2019-02-24 (×2): 150 ug via ORAL
  Filled 2019-02-23 (×2): qty 2
  Filled 2019-02-23: qty 1

## 2019-02-23 MED ORDER — PANTOPRAZOLE SODIUM 40 MG PO TBEC
40.0000 mg | DELAYED_RELEASE_TABLET | Freq: Every day | ORAL | Status: DC
  Administered 2019-02-23 – 2019-02-24 (×2): 40 mg via ORAL
  Filled 2019-02-23 (×3): qty 1

## 2019-02-23 MED ORDER — CHLORHEXIDINE GLUCONATE CLOTH 2 % EX PADS
6.0000 | MEDICATED_PAD | Freq: Every day | CUTANEOUS | Status: DC
  Administered 2019-02-23: 6 via TOPICAL

## 2019-02-23 MED ORDER — SODIUM CHLORIDE 0.9 % IV SOLN: INTRAVENOUS | Status: DC

## 2019-02-23 MED ORDER — MUPIROCIN 2 % EX OINT
1.0000 "application " | TOPICAL_OINTMENT | Freq: Two times a day (BID) | CUTANEOUS | Status: DC
  Administered 2019-02-23 – 2019-02-24 (×3): 1 via NASAL
  Filled 2019-02-23: qty 22

## 2019-02-23 MED ORDER — ENOXAPARIN SODIUM 40 MG/0.4ML ~~LOC~~ SOLN
40.0000 mg | SUBCUTANEOUS | Status: DC
  Administered 2019-02-23: 23:00:00 40 mg via SUBCUTANEOUS
  Filled 2019-02-23: qty 0.4

## 2019-02-23 MED ORDER — LIVING WELL WITH DIABETES BOOK
Freq: Once | Status: AC
  Administered 2019-02-23: 14:00:00
  Filled 2019-02-23: qty 1

## 2019-02-23 MED ORDER — CLONAZEPAM 0.5 MG PO TABS: 1.0000 mg | ORAL_TABLET | Freq: Two times a day (BID) | ORAL | Status: DC

## 2019-02-23 MED ORDER — INSULIN ASPART 100 UNIT/ML ~~LOC~~ SOLN: 0.0000 [IU] | Freq: Three times a day (TID) | SUBCUTANEOUS | Status: DC

## 2019-02-23 MED ORDER — CLONAZEPAM 1 MG PO TABS
1.0000 mg | ORAL_TABLET | Freq: Every day | ORAL | Status: DC
  Administered 2019-02-23 – 2019-02-24 (×2): 1 mg via ORAL
  Filled 2019-02-23 (×2): qty 1

## 2019-02-23 MED ORDER — INSULIN REGULAR(HUMAN) IN NACL 100-0.9 UT/100ML-% IV SOLN
INTRAVENOUS | Status: DC
  Administered 2019-02-23: 7.3 [IU]/h via INTRAVENOUS
  Filled 2019-02-23: qty 100

## 2019-02-23 MED ORDER — ONDANSETRON HCL 4 MG/2ML IJ SOLN
4.0000 mg | Freq: Once | INTRAMUSCULAR | Status: AC
  Administered 2019-02-23: 4 mg via INTRAVENOUS
  Filled 2019-02-23: qty 2

## 2019-02-23 MED ORDER — TRAZODONE HCL 50 MG PO TABS: 100.0000 mg | ORAL_TABLET | Freq: Every evening | ORAL | Status: DC | PRN

## 2019-02-23 MED ORDER — QUETIAPINE FUMARATE 100 MG PO TABS
800.0000 mg | ORAL_TABLET | Freq: Every day | ORAL | Status: DC
  Administered 2019-02-23: 800 mg via ORAL
  Filled 2019-02-23: qty 8

## 2019-02-23 MED ORDER — INSULIN REGULAR(HUMAN) IN NACL 100-0.9 UT/100ML-% IV SOLN
INTRAVENOUS | Status: DC
  Administered 2019-02-23: 4.2 [IU]/h via INTRAVENOUS
  Filled 2019-02-23: qty 100

## 2019-02-23 NOTE — ED Notes (Signed)
Spoke with Ubaldo Glassing in lab to add on ethanol

## 2019-02-23 NOTE — Progress Notes (Signed)
Inpatient Diabetes Program Recommendations  AACE/ADA: New Consensus Statement on Inpatient Glycemic Control (2015)  Target Ranges:  Prepandial:   less than 140 mg/dL      Peak postprandial:   less than 180 mg/dL (1-2 hours)      Critically ill patients:  140 - 180 mg/dL   Lab Results  Component Value Date   GLUCAP 193 (H) 02/23/2019   HGBA1C 12.5 (H) 02/23/2019    Review of Glycemic Control  Diabetes history: New DM Diagnosis this admission  Current orders for Inpatient glycemic control: IV insulin per DKA protocol  A1c 12.5% Pt has medicaid insurance can get insulin pen Pen needles Glucometer at time of d/c  Inpatient Diabetes Program Recommendations:    CO2 still 13, Anion Gap coming down slightly now at 18. Pt NPO. Will be on IV insulin for now.  Will speak with patient today. Ordered Living well with DM booklet and insulin pen starter kit.  Spoke with patient at bedside regarding A1c level and new DM diagnosis. Discussed current A1c in the setting of recent steroid use and pt drinking multiple Gatorades a day.  Discussed basic pathophysiology of DM Type 2, basic home care, importance of checking CBGs and maintaining good CBG control to prevent long-term and short-term complications. Reviewed glucose and A1C goals.    Reviewed signs and symptoms of hyperglycemia and hypoglycemia along with treatment for both. Discussed impact of nutrition, exercise, stress, sickness, and medications on diabetes control. Discussed carbohydrates and portion sizes.  Encouraged pt to read through the Living Well with diabetes booklet.   Informed patient that he may be prescribed long acting insulin at time of d/c. Showed pt how to use insulin pen at time of d/c.  Asked patient to check his glucose at least 2 times per day (fasting and alternating second check.   Discussed importance of following up with PCP to recheck A1c level.  Patient verbalized understanding of information discussed and  has no further questions at this time related to diabetes.  RNs to provide ongoing basic DM education at bedside with this patient and engage patient to actively check blood glucose and administer insulin injections.   Thanks,  Tama Headings RN, MSN, BC-ADM Inpatient Diabetes Coordinator Team Pager [phone] (8a-5p)

## 2019-02-23 NOTE — ED Provider Notes (Addendum)
[facility] DEPT Provider Note: Georgena Spurling, MD, FACEP  CSN: [id number] MRN: [id number] ARRIVAL: 02/23/19 at [place]: [id number]   CHIEF COMPLAINT  Hyperglycemia and Nausea  Level 5 caveat: Altered mental status HISTORY OF PRESENT ILLNESS  02/23/19 2:34 AM [name] [name] is a 63 y.o. male brought by EMS for complaints of nausea, decreased appetite and increased thirst for the past week.  He advised the triage nurse that he had already taken his evening medications, including Seroquel.  His sugar was noted to be greater than 600 on arrival.  He has no history of diabetes.  He does have a history of alcohol abuse and bipolar disorder.  He denies shortness of breath.  He had one episode of retching earlier.   Past Medical History:  Diagnosis Date  . Anxiety   . Arthritis   . Asthma   . Bipolar 1 disorder ([place])   . Carpal tunnel syndrome on right 02/04/2016  . Chronic insomnia   . COPD (chronic obstructive pulmonary disease) ([place])   . Depression   . Diaphragmatic hernia   . Dizziness   . ED (erectile dysfunction)   . GERD (gastroesophageal reflux disease)   . HLD (hyperlipidemia)   . Hypothyroidism   . SOB (shortness of breath)   . Tobacco use   . Umbilical hernia without obstruction and without gangrene     Past Surgical History:  Procedure Laterality Date  . COLONOSCOPY    . INSERTION OF MESH N/A 01/01/2013   Procedure: INSERTION OF MESH;  Surgeon: Merrie Roof, MD;  Location: [facility];  Service: General;  Laterality: N/A;  . UMBILICAL HERNIA REPAIR N/A 01/01/2013   Procedure: HERNIA REPAIR UMBILICAL ADULT;  Surgeon: Merrie Roof, MD;  Location: [facility];  Service: General;  Laterality: N/A;  . UPPER GI ENDOSCOPY      Family History  Problem Relation Age of Onset  . Arrhythmia Other   . ALS Other   . Prostate cancer Maternal Grandfather   . Colon cancer Paternal Grandfather   . Heart disease Mother   . Arthritis Mother   . Mental illness Brother      Social History   Tobacco Use  . Smoking status: Current Every Day Smoker    Packs/day: 2.00    Years: 40.00    Pack years: 80.00    Types: Cigarettes  . Smokeless tobacco: Never Used  Substance Use Topics  . Alcohol use: Yes    Comment: "Never, hardly one at all"  . Drug use: No    Prior to Admission medications   Medication Sig Start Date End Date Taking? Authorizing Provider  hydrOXYzine (ATARAX/VISTARIL) 25 MG tablet Take 1 tablet (25 mg total) by mouth every 6 (six) hours as needed for anxiety. 08/27/16   Lindell Spar I, NP  lamoTRIgine (LAMICTAL) 25 MG tablet Take 1 tablet (25 mg total) by mouth 2 (two) times daily. For mood stabilization 08/27/16   Lindell Spar I, NP  levothyroxine (SYNTHROID, LEVOTHROID) 150 MCG tablet Take 1 tablet (150 mcg total) by mouth every morning. For thyroid hormone replacement 08/27/16   Lindell Spar I, NP  lidocaine (LIDODERM) 5 % Place 1 patch onto the skin daily. Remove & Discard patch within 12 hours or as directed by MD: For pain 08/27/16   Lindell Spar I, NP  pantoprazole (PROTONIX) 40 MG tablet Take 1 tablet (40 mg total) by mouth 2 (two) times daily before a meal. For acid reflux 08/27/16  Lindell Spar I, NP  QUEtiapine (SEROQUEL) 25 MG tablet Take 1 tablet (12.5 mg) after breakfast & 1 tablet (12.5) after lunch: For agitation 08/27/16   Lindell Spar I, NP  QUEtiapine (SEROQUEL) 400 MG tablet Take 1 tablet (400 mg total) by mouth at bedtime. For mood control 08/27/16   Lindell Spar I, NP  traZODone (DESYREL) 100 MG tablet Take 1 tablet (100 mg total) by mouth at bedtime as needed for sleep. 08/27/16   Lindell Spar I, NP    Allergies Codeine, Lipitor [atorvastatin], Lithium carbonate [lithium], Pollen extract, and Talwin [pentazocine]   REVIEW OF SYSTEMS     PHYSICAL EXAMINATION  Initial Vital Signs Blood pressure (!) 123/92, pulse 100, temperature 98.9 F (37.2 C), temperature source Oral, resp. rate 17, height 5\' 6"  (1.676 m), weight 81.6 kg,  SpO2 99 %.  Examination General: Well-developed, well-nourished male in no acute distress; appearance consistent with age of record HENT: normocephalic; atraumatic; ketotic breath Eyes: pupils equal, round and reactive to light; extraocular muscles intact Neck: supple Heart: regular rate and rhythm Lungs: clear to auscultation bilaterally Abdomen: soft; nondistended; nontender; no masses or hepatosplenomegaly; bowel sounds present Extremities: No deformity; full range of motion; pulses normal Neurologic: Awake, lethargic, slurred speech; motor function intact in all extremities and symmetric; no facial droop Skin: Warm and dry Psychiatric: Flat affect; slurred, slow speech; difficulty answering questions   RESULTS  Summary of this visit's results, reviewed by myself:   EKG Interpretation  Date/Time:    Ventricular Rate:    PR Interval:    QRS Duration:   QT Interval:    QTC Calculation:   R Axis:     Text Interpretation:        Laboratory Studies: Results for orders placed or performed during the hospital encounter of 02/23/19 (from the past 24 hour(s))  CBG monitoring, ED     Status: Abnormal   Collection Time: 02/23/19 12:56 AM  Result Value Ref Range   Glucose-Capillary >600 (HH) 70 - 99 mg/dL   Comment 1 Notify RN   Basic metabolic panel     Status: Abnormal   Collection Time: 02/23/19  1:45 AM  Result Value Ref Range   Sodium 124 (L) 135 - 145 mmol/L   Potassium 5.2 (H) 3.5 - 5.1 mmol/L   Chloride 88 (L) 98 - 111 mmol/L   CO2 8 (L) 22 - 32 mmol/L   Glucose, Bld 672 (HH) 70 - 99 mg/dL   BUN 44 (H) 8 - 23 mg/dL   Creatinine, Ser 1.52 (H) 0.61 - 1.24 mg/dL   Calcium 8.8 (L) 8.9 - 10.3 mg/dL   GFR calc non Af Amer 48 (L) >60 mL/min   GFR calc Af Amer 56 (L) >60 mL/min   Anion gap 28 (H) 5 - 15  CBC     Status: Abnormal   Collection Time: 02/23/19  1:45 AM  Result Value Ref Range   WBC 10.6 (H) 4.0 - 10.5 K/uL   RBC 5.34 4.22 - 5.81 MIL/uL   Hemoglobin 16.1  13.0 - 17.0 g/dL   HCT 47.0 39.0 - 52.0 %   MCV 88.0 80.0 - 100.0 fL   MCH 30.1 26.0 - 34.0 pg   MCHC 34.3 30.0 - 36.0 g/dL   RDW 13.3 11.5 - 15.5 %   Platelets 284 150 - 400 K/uL   nRBC 0.0 0.0 - 0.2 %  Ethanol     Status: None   Collection Time: 02/23/19  2:45 AM  Result Value Ref Range   Alcohol, Ethyl (B) <10 <10 mg/dL  Blood gas, venous     Status: Abnormal   Collection Time: 02/23/19  2:45 AM  Result Value Ref Range   pH, Ven 7.218 (L) 7.250 - 7.430   pCO2, Ven 27.5 (L) 44.0 - 60.0 mmHg   pO2, Ven 43.2 32.0 - 45.0 mmHg   Bicarbonate 10.8 (L) 20.0 - 28.0 mmol/L   Acid-base deficit 15.7 (H) 0.0 - 2.0 mmol/L   O2 Saturation 67.3 %   Patient temperature 98.6   CBG monitoring, ED     Status: Abnormal   Collection Time: 02/23/19  3:33 AM  Result Value Ref Range   Glucose-Capillary 480 (H) 70 - 99 mg/dL   Imaging Studies: No results found.  ED COURSE and MDM  Nursing notes and initial vitals signs, including pulse oximetry, reviewed.  Vitals:   02/23/19 0054 02/23/19 0055 02/23/19 0144  BP: (!) 126/96  (!) 123/92  Pulse: 94  100  Resp: 16  17  Temp: 98.9 F (37.2 C)    TempSrc: Oral    SpO2: 100%  99%  Weight:  81.6 kg   Height:  5\' 6"  (1.676 m)    2:55 AM Insulin drip per GlucoStabilizer ordered.  3:53 AM Laboratory studies consistent with diabetic ketoacidosis in the setting of new onset diabetes.  PROCEDURES   CRITICAL CARE Performed by: Karen Chafe Tajuana Kniskern Total critical care time: 35 minutes Critical care time was exclusive of separately billable procedures and treating other patients. Critical care was necessary to treat or prevent imminent or life-threatening deterioration. Critical care was time spent personally by me on the following activities: development of treatment plan with patient and/or surrogate as well as nursing, discussions with consultants, evaluation of patient's response to treatment, examination of patient, obtaining history from patient or  surrogate, ordering and performing treatments and interventions, ordering and review of laboratory studies, ordering and review of radiographic studies, pulse oximetry and re-evaluation of patient's condition.   ED DIAGNOSES     ICD-10-CM   1. Early diabetic ketoacidosis (HCC)  E11.10   2. Diabetes mellitus, new onset ([place])  E11.9        Khary Schaben, MD 02/23/19 0736    Shanon Rosser, MD 03/01/19 [id number]

## 2019-02-23 NOTE — ED Triage Notes (Signed)
Per ems: Pt coming from home c/o nausea and increased thirst. CBG was 465 earlier and 429 now. No hx of DM. Pt stated "I have been drinking 8 Gatorades today and not eating" Pt took vertigo medication.    150/98 98 hr 100 room air 18 rr 98.3 temp

## 2019-02-23 NOTE — Progress Notes (Signed)
[facility]  PROGRESS NOTE  [name] [name] [id number] DOB: 25-Sep-1955 DOA: 02/23/2019 PCP: Christain Sacramento, MD  Brief History    [name] [name] is a 63 y.o. year old male with medical history significant for hypothyroidism, bipolar disorder, COPD and previous history of alcohol abuse  who presented on 02/23/2019 with plaints of nausea, increased thirst, EMS measured CBG 465, decreased oral intake patient reported worsening appetite, polydipsia, and nausea for the past week.  Of note patient was recently (10/9) started on azithromycin and prednisone for presumed acute bronchitis, he was advised to stop steroids on 10/14 due to losing balance and "bouncing off the walls".  In ED was found to have blood glucose of 600, anion gap of 28, pH of 7.2, creatinine of 1.52, UA with ketones and glucosuria consistent with DKA.  Patient was admitted to stepdown unit on insulin drip for treatment of new onset diabetes complicated by DKA, presumed type II.  A & P    New onset diabetes complicated by DKA, presumed type II.  No prior history, likely ongoing diagnosis that worsened in setting of recent prednisone use.   A1c 12.5.  Anion gap is still elevated but coming down, continue insulin drip until gap closes, monitor BMP.  Currently on D5 half-normal given blood sugar less than 250.  Will initiate long-acting once gap closes and allow diet at that time.   Pseudohyponatremia in the setting of hyperglycemia.  Corrected sodium within normal limits.  Continue to closely monitor.   AKI likely prerenal in setting of diminished oral intake related to DKA, improving.  Continue fluid resuscitation, monitor serial BMP, avoid nephrotoxins, monitor output.   Screening, Covid test currently pending.  Has no respiratory symptoms.  Currently on room air.  Continue close monitor.   Acquired Hypothyroidism, stable.  Continue Synthroid. TSH of 23 as outpatient ( 01/2019). Managed by PCP   HLD, stable.  Continue  Crestor.   Bipolar disorder, stable.  Continue Seroquel   COPD, stable.  No active wheezing.  Recent treatment for bronchitis, closely monitor.   History of alcohol abuse.  Reports abstinence over several months, closely monitor.  No active signs of withdrawal.     DVT prophylaxis: Lovenox Code Status: Full code Family Communication: No family at bedside Disposition Plan: Admit as inpatient due to DKA and new onset diabetes, continue insulin drip due to gap still being open, close monitoring of glucose and electrolytes.     [facility] Direct contact: see [email or web address] (further directions at bottom of note if needed) 7PM-7AM contact night coverage as at bottom of note 02/23/2019, 10:54 AM  LOS: 0 days   Consultants  . None  Procedures  . None  Antibiotics  . None  Interval History/Subjective  Wants to eat No pain Admits drinking quite a bit of Gatorade no history of diabetes Diabetes runs in family  Objective   Vitals:  Vitals:   02/23/19 1016 02/23/19 1025  BP: (!) 145/72   Pulse: 70 73  Resp: 20 17  Temp:    SpO2: 99% 98%    Exam: Awake, alert, oriented x4 Normal respiratory effort on room air, clear breath sounds Abdomen soft, nondistended, nontender Regular and rhythm, no peripheral edema No new rashes or bruising NCAT, normal oral mucosa Moving all extremities, no focal deficits     I have personally reviewed the following:   Data Reviewed: Basic Metabolic Panel: Recent Labs  Lab 02/23/19 0145 02/23/19 0609  NA 124* 133*  K 5.2*  3.8  CL 88* 99  CO2 8* 13*  GLUCOSE 672* 266*  BUN 44* 38*  CREATININE 1.52* 1.26*  CALCIUM 8.8* 8.5*   Liver Function Tests: No results for input(s): AST, ALT, ALKPHOS, BILITOT, PROT, ALBUMIN in the last 168 hours. No results for input(s): LIPASE, AMYLASE in the last 168 hours. No results for input(s): AMMONIA in the last 168 hours. CBC: Recent Labs  Lab 02/23/19 0145 02/23/19 0609  WBC 10.6*  13.2*  NEUTROABS  --  7.2  HGB 16.1 15.6  HCT 47.0 46.3  MCV 88.0 88.5  PLT 284 273   Cardiac Enzymes: No results for input(s): CKTOTAL, CKMB, CKMBINDEX, TROPONINI in the last 168 hours. BNP (last 3 results) No results for input(s): BNP in the last 8760 hours.  ProBNP (last 3 results) No results for input(s): PROBNP in the last 8760 hours.  CBG: Recent Labs  Lab 02/23/19 0454 02/23/19 0601 02/23/19 0649 02/23/19 0941 02/23/19 1046  GLUCAP 441* 309* 273* 171* 193*    No results found for this or any previous visit (from the past 240 hour(s)).   Studies: Ct Head Wo Contrast  Result Date: 02/23/2019 CLINICAL DATA:  Unexplained altered level of consciousness EXAM: CT HEAD WITHOUT CONTRAST TECHNIQUE: Contiguous axial images were obtained from the base of the skull through the vertex without intravenous contrast. COMPARISON:  08/20/2016 FINDINGS: Brain: No evidence of acute infarction, hemorrhage, hydrocephalus, extra-axial collection or mass lesion/mass effect. Vascular: No hyperdense vessel.  Atherosclerotic calcification. Skull: Normal. Negative for fracture or focal lesion. Sinuses/Orbits: No acute finding. IMPRESSION: Negative head CT. Electronically Signed   By: Monte Fantasia M.D.   On: 02/23/2019 05:08    Scheduled Meds: . Chlorhexidine Gluconate Cloth  6 each Topical Daily  . clonazePAM  1 mg Oral Daily   And  . clonazePAM  2 mg Oral QHS  . enoxaparin (LOVENOX) injection  40 mg Subcutaneous Q24H  . levothyroxine  150 mcg Oral Q0600  . pantoprazole  40 mg Oral Daily  . QUEtiapine  800 mg Oral QHS  . rosuvastatin  10 mg Oral Daily   Continuous Infusions: . sodium chloride    . dextrose 5 % and 0.45% NaCl 125 mL/hr at 02/23/19 0945  . insulin 1.1 Units/hr (02/23/19 0945)    Principal Problem:   DKA (diabetic ketoacidoses) (HCC) Active Problems:   GERD (gastroesophageal reflux disease)   Hypothyroidism   COPD (chronic obstructive pulmonary disease) (HCC)    Bipolar disorder, current episode manic severe with psychotic features ([place])   ARF (acute renal failure) ([place])       D   [facility]            \

## 2019-02-23 NOTE — Progress Notes (Addendum)
CRITICAL VALUE ALERT  Critical Value: MRSA + in nares  Date & Time Notied:  1240, 02/23/2019  Provider Notified: Lisbeth Ply, MD   Orders Received/Actions taken: Put in MRSA positive standing orders

## 2019-02-23 NOTE — H&P (Signed)
History and Physical    [name] [name] [id number] DOB: 08-13-1955 DOA: 02/23/2019  PCP: Christain Sacramento, MD  Patient coming from: Home.  Chief Complaint: Elevated blood sugar.  HPI: [name] [name] is a 63 y.o. male with history of hypothyroidism, bipolar disorder, COPD and previous history of alcohol abuse which patient states he has not had any alcohol for many months presents to the ER because of elevated blood sugar.  Reviewing patient chart and care everywhere patient was recently treated with prednisone for bronchitis.  Also was taking azithromycin.  Patient started feeling dizzy and difficulty walking and has been having polyuria polydipsia for last few days.  Given the weakness and the symptoms EMS was called and patient blood sugar was found to be elevated more than 400.  Patient was brought to the ER.  Patient at this time denies any chest pain shortness of breath nausea vomiting diarrhea.  Patient is not very forthcoming with history.  ED Course: In the ER patient was afebrile with labs showing sodium of 124 potassium 5.2 chloride 88 bicarb 8 blood sugar 672 creatinine 1.5 anion gap 28 WBC 10.6 hemoglobin 16.1 platelets 284 urine showing ketones venous blood gas showing pH of 7.21.  Started on IV insulin infusion IV fluids for DKA and admitted for further management.  Review of Systems: As per HPI, rest all negative.   Past Medical History:  Diagnosis Date  . Anxiety   . Arthritis   . Asthma   . Bipolar 1 disorder ([place])   . Carpal tunnel syndrome on right 02/04/2016  . Chronic insomnia   . COPD (chronic obstructive pulmonary disease) ([place])   . Depression   . Diaphragmatic hernia   . Dizziness   . ED (erectile dysfunction)   . GERD (gastroesophageal reflux disease)   . HLD (hyperlipidemia)   . Hypothyroidism   . SOB (shortness of breath)   . Tobacco use   . Umbilical hernia without obstruction and without gangrene     Past Surgical History:  Procedure Laterality  Date  . COLONOSCOPY    . INSERTION OF MESH N/A 01/01/2013   Procedure: INSERTION OF MESH;  Surgeon: Merrie Roof, MD;  Location: [facility];  Service: General;  Laterality: N/A;  . UMBILICAL HERNIA REPAIR N/A 01/01/2013   Procedure: HERNIA REPAIR UMBILICAL ADULT;  Surgeon: Merrie Roof, MD;  Location: [facility];  Service: General;  Laterality: N/A;  . UPPER GI ENDOSCOPY       reports that he has been smoking cigarettes. He has a 80.00 pack-year smoking history. He has never used smokeless tobacco. He reports current alcohol use. He reports that he does not use drugs.  Allergies  Allergen Reactions  . Codeine Nausea And Vomiting  . Lipitor [Atorvastatin] Other (See Comments)    Abdominal pains, Leg cramps  . Lithium Carbonate [Lithium] Other (See Comments)    Mental Status Changes  . Pollen Extract Itching and Other (See Comments)    Itching Eyes & Congestion  . Talwin [Pentazocine] Other (See Comments)    Hallucination, shaking    Family History  Problem Relation Age of Onset  . Arrhythmia Other   . ALS Other   . Prostate cancer Maternal Grandfather   . Colon cancer Paternal Grandfather   . Heart disease Mother   . Arthritis Mother   . Mental illness Brother     Prior to Admission medications   Medication Sig Start Date End Date Taking? Authorizing Provider  albuterol (VENTOLIN HFA) 108 (90 Base) MCG/ACT inhaler Inhale 1-2 puffs into the lungs every 4 (four) hours as needed for wheezing or shortness of breath.  01/23/19  Yes [provider]  clonazePAM (KLONOPIN) 1 MG tablet Take 1-2 mg by mouth 2 (two) times daily. Take one tablet during the day and two tablets at bedtime. 01/11/19  Yes [provider]  esomeprazole (NEXIUM) 40 MG capsule Take 40 mg by mouth daily. 01/21/19  Yes [provider]  indomethacin (INDOCIN) 25 MG capsule Take 25 mg by mouth 2 (two) times daily as needed (For gout.).    Yes [provider]  levothyroxine (SYNTHROID,  LEVOTHROID) 150 MCG tablet Take 1 tablet (150 mcg total) by mouth every morning. For thyroid hormone replacement 08/27/16  Yes Lindell Spar I, NP  meclizine (ANTIVERT) 25 MG tablet Take 12.5 mg by mouth 3 (three) times daily as needed for dizziness.  06/06/17  Yes [provider]  metaxalone (SKELAXIN) 800 MG tablet Take 800 mg by mouth 4 (four) times daily as needed for muscle spasms.  08/30/18  Yes [provider]  predniSONE (DELTASONE) 20 MG tablet Take 20 mg by mouth See admin instructions. Take one tablet three times daily for 3 days, then one tablet twice daily for 3 days, then one tablet daily for 3 days. 02/16/19  Yes [provider]  QUEtiapine (SEROQUEL) 400 MG tablet Take 1 tablet (400 mg total) by mouth at bedtime. For mood control Patient taking differently: Take 800 mg by mouth at bedtime. For mood control 08/27/16  Yes Nwoko, Herbert Pun I, NP  rosuvastatin (CRESTOR) 10 MG tablet Take 10 mg by mouth daily. 02/03/19  Yes [provider]  traZODone (DESYREL) 100 MG tablet Take 1 tablet (100 mg total) by mouth at bedtime as needed for sleep. 08/27/16  Yes Encarnacion Slates, NP    Physical Exam: Constitutional: Moderately built and nourished. Vitals:   02/23/19 0055 02/23/19 0144 02/23/19 0409 02/23/19 0500  BP:  (!) 123/92  126/85  Pulse:  100 79 81  Resp:  17  16  Temp:      TempSrc:      SpO2:  99% 100% 100%  Weight: 81.6 kg     Height: 5\' 6"  (1.676 m)      Eyes: Nonicteric no pallor. ENMT: No discharge from the ears eyes nose or mouth. Neck: No mass felt.  No neck rigidity. Respiratory: No rhonchi or crepitations. Cardiovascular: S1-S2 heard. Abdomen: Soft nontender bowel sounds present. Musculoskeletal: No edema. Skin: No rash. Neurologic: Alert awake oriented to time place and person.  Moves all extremities. Psychiatric: Appears normal.   Labs on Admission: I have personally reviewed following labs and imaging studies  CBC: Recent Labs  Lab  02/23/19 0145  WBC 10.6*  HGB 16.1  HCT 47.0  MCV 88.0  PLT [id number]   Basic Metabolic Panel: Recent Labs  Lab 02/23/19 0145  NA 124*  K 5.2*  CL 88*  CO2 8*  GLUCOSE 672*  BUN 44*  CREATININE 1.52*  CALCIUM 8.8*   GFR: Estimated Creatinine Clearance: 49.9 mL/min (A) (by C-G formula based on SCr of 1.52 mg/dL (H)). Liver Function Tests: No results for input(s): AST, ALT, ALKPHOS, BILITOT, PROT, ALBUMIN in the last 168 hours. No results for input(s): LIPASE, AMYLASE in the last 168 hours. No results for input(s): AMMONIA in the last 168 hours. Coagulation Profile: No results for input(s): INR, PROTIME in the last 168 hours. Cardiac Enzymes: No  results for input(s): CKTOTAL, CKMB, CKMBINDEX, TROPONINI in the last 168 hours. BNP (last 3 results) No results for input(s): PROBNP in the last 8760 hours. HbA1C: No results for input(s): HGBA1C in the last 72 hours. CBG: Recent Labs  Lab 02/23/19 0056 02/23/19 0333 02/23/19 0454 02/23/19 0601  GLUCAP >600* 480* 441* 309*   Lipid Profile: No results for input(s): CHOL, HDL, LDLCALC, TRIG, CHOLHDL, LDLDIRECT in the last 72 hours. Thyroid Function Tests: No results for input(s): TSH, T4TOTAL, FREET4, T3FREE, THYROIDAB in the last 72 hours. Anemia Panel: No results for input(s): VITAMINB12, FOLATE, FERRITIN, TIBC, IRON, RETICCTPCT in the last 72 hours. Urine analysis:    Component Value Date/Time   COLORURINE STRAW (A) 02/23/2019 0102   APPEARANCEUR CLEAR 02/23/2019 0102   LABSPEC 1.024 02/23/2019 0102   PHURINE 5.0 02/23/2019 0102   GLUCOSEU >=500 (A) 02/23/2019 0102   HGBUR SMALL (A) 02/23/2019 0102   BILIRUBINUR NEGATIVE 02/23/2019 0102   KETONESUR 80 (A) 02/23/2019 0102   PROTEINUR NEGATIVE 02/23/2019 0102   UROBILINOGEN 0.2 05/27/2009 0141   NITRITE NEGATIVE 02/23/2019 0102   LEUKOCYTESUR NEGATIVE 02/23/2019 0102   Sepsis Labs: @LABRCNTIP (procalcitonin:4,lacticidven:4) )No results found for this or any previous  visit (from the past 240 hour(s)).   Radiological Exams on Admission: Ct Head Wo Contrast  Result Date: 02/23/2019 CLINICAL DATA:  Unexplained altered level of consciousness EXAM: CT HEAD WITHOUT CONTRAST TECHNIQUE: Contiguous axial images were obtained from the base of the skull through the vertex without intravenous contrast. COMPARISON:  08/20/2016 FINDINGS: Brain: No evidence of acute infarction, hemorrhage, hydrocephalus, extra-axial collection or mass lesion/mass effect. Vascular: No hyperdense vessel.  Atherosclerotic calcification. Skull: Normal. Negative for fracture or focal lesion. Sinuses/Orbits: No acute finding. IMPRESSION: Negative head CT. Electronically Signed   By: Monte Fantasia M.D.   On: 02/23/2019 05:08    EKG: Independently reviewed.  Normal sinus rhythm.  Assessment/Plan Principal Problem:   DKA (diabetic ketoacidoses) (HCC) Active Problems:   GERD (gastroesophageal reflux disease)   Hypothyroidism   COPD (chronic obstructive pulmonary disease) (HCC)   Bipolar disorder, current episode manic severe with psychotic features ([place])   ARF (acute renal failure) ([place])    1. Diabetic ketoacidosis likely type 2 diabetes -check hemoglobin A1c.  Patient was recently on prednisone which could have further worsening his blood sugar.  We will continue with IV insulin infusion and closely follow metabolic panel and once anion gap gets corrected change to long-acting subcutaneous insulin.  IV fluids. 2. History of hypothyroidism on Synthroid. 3. History of bipolar disorder and anxiety on Seroquel and Klonopin. 4. Acute renal failure with hyponatremia and mild hyperkalemia likely from diabetic ketoacidosis and dehydration which I think will improve with correction of blood sugar and also hydration.  Follow metabolic panel. 5. COPD not actively wheezing.  Recently was treated for bronchitis.  COVID-19 is pending. 6. History of GERD on PPI. 7. History of alcohol abuse which patient  states he has not had any alcohol for many months.  Closely monitor.  Given that patient has significant acidosis will need close monitoring with IV insulin IV fluids metabolic panel and will need more than 2 midnight stay.  Patient will be inpatient status.   DVT prophylaxis: Lovenox. Code Status: Full code. Family Communication: Discussed with patient. Disposition Plan: Home. Consults called: None. Admission status: Inpatient.   Rise Patience MD [facility] Pager [phone].  If 7PM-7AM, please contact night-coverage [email or web address] Password [email or web address]  02/23/2019, 6:10 AM

## 2019-02-24 ENCOUNTER — Emergency Department (HOSPITAL_COMMUNITY)
Admission: EM | Admit: 2019-02-24 | Discharge: 2019-02-24 | Disposition: A | Discharge status: Home / Self Care | Payer: Medicaid Other | Attending: Emergency Medicine | Admitting: Emergency Medicine

## 2019-02-24 ENCOUNTER — Encounter (HOSPITAL_COMMUNITY): Payer: Self-pay | Admitting: Emergency Medicine

## 2019-02-24 DIAGNOSIS — E1165 Type 2 diabetes mellitus with hyperglycemia: Secondary | ICD-10-CM | POA: Insufficient documentation

## 2019-02-24 DIAGNOSIS — J449 Chronic obstructive pulmonary disease, unspecified: Secondary | ICD-10-CM | POA: Insufficient documentation

## 2019-02-24 DIAGNOSIS — R739 Hyperglycemia, unspecified: Secondary | ICD-10-CM

## 2019-02-24 DIAGNOSIS — F17210 Nicotine dependence, cigarettes, uncomplicated: Secondary | ICD-10-CM | POA: Insufficient documentation

## 2019-02-24 DIAGNOSIS — Z79899 Other long term (current) drug therapy: Secondary | ICD-10-CM | POA: Insufficient documentation

## 2019-02-24 DIAGNOSIS — E039 Hypothyroidism, unspecified: Secondary | ICD-10-CM | POA: Insufficient documentation

## 2019-02-24 DIAGNOSIS — Z794 Long term (current) use of insulin: Secondary | ICD-10-CM | POA: Insufficient documentation

## 2019-02-24 LAB — CBC
HCT: 40.1 % (ref 39.0–52.0)
Hemoglobin: 13.6 g/dL (ref 13.0–17.0)
MCH: 29.9 pg (ref 26.0–34.0)
MCHC: 33.9 g/dL (ref 30.0–36.0)
MCV: 88.1 fL (ref 80.0–100.0)
Platelets: 226 10*3/uL (ref 150–400)
RBC: 4.55 MIL/uL (ref 4.22–5.81)
RDW: 13.5 % (ref 11.5–15.5)
WBC: 11.4 10*3/uL — ABNORMAL HIGH (ref 4.0–10.5)
nRBC: 0 % (ref 0.0–0.2)

## 2019-02-24 LAB — CBG MONITORING, ED
Glucose-Capillary: 484 mg/dL — ABNORMAL HIGH (ref 70–99)
Glucose-Capillary: 375 mg/dL — ABNORMAL HIGH (ref 70–99)

## 2019-02-24 LAB — GLUCOSE, CAPILLARY
Glucose-Capillary: 282 mg/dL — ABNORMAL HIGH (ref 70–99)
Glucose-Capillary: 251 mg/dL — ABNORMAL HIGH (ref 70–99)

## 2019-02-24 MED ORDER — INSULIN GLARGINE 100 UNITS/ML SOLOSTAR PEN: 15.0000 [IU] | PEN_INJECTOR | Freq: Every day | SUBCUTANEOUS | 0 refills | Status: AC

## 2019-02-24 MED ORDER — INSULIN REGULAR HUMAN 100 UNIT/ML IJ SOLN: 8.0000 [IU] | Freq: Three times a day (TID) | INTRAMUSCULAR | 0 refills | Status: AC

## 2019-02-24 MED ORDER — INSULIN ASPART 100 UNIT/ML ~~LOC~~ SOLN
10.0000 [IU] | Freq: Once | SUBCUTANEOUS | Status: AC
  Administered 2019-02-24: 10 [IU] via SUBCUTANEOUS
  Filled 2019-02-24: qty 0.1

## 2019-02-24 MED ORDER — "INSULIN SYRINGE 31G X 5/16"" 0.3 ML MISC": 0 refills | Status: AC

## 2019-02-24 MED ORDER — INSULIN PEN NEEDLE 31G X 5 MM MISC: 0 refills | Status: AC

## 2019-02-24 NOTE — ED Triage Notes (Addendum)
Patient here from home with complaints of hyperglycemia. Reports that he left AMA around 1pm from ICU for DKA. Needs insulin medication.

## 2019-02-24 NOTE — Progress Notes (Signed)
Patient requested IV site removal informed patient that his IV cannot be removed until he discharges home, patient states that he is leaving today. Informed patient that he cannot leave today, he just has transfer orders and should remain in the hospital until the doctor finds him fit for discharge. Patient states that he has been in the hospital long enough and does not want to stay any longer. Informed patient that we cannot keep him against his will, but he is not considered safe to go home yet and he could get sicker which could lead to death. Patient states that he understands this. Patient asked about papers for his doctor to review about his visit and insulin prescription. Informed him that those items are given at discharge and he is not being discharged but leaving against medical advice. Patient states he understands and that he will contact his PCP. IV site removed, MD notified, AMA papers signed. Patient is currently getting dressed to walk downstairs.

## 2019-02-24 NOTE — ED Provider Notes (Signed)
[facility] DEPT Provider Note   CSN: [id number] Arrival date & time: 02/24/19  1440     History   Chief Complaint Chief Complaint  Patient presents with  . Hyperglycemia  . Medication Refill    HPI [name] [name] is a 63 y.o. male.     HPI Patient was admitted yesterday for new onset diabetes and DKA.  Was admitted to stepdown floor.  Patient was being transferred to regular floor and left the hospital prior to being discharged around 1 PM today.  Did not receive any prescriptions at that time.  Patient states he has chronic dizziness attributed to vertigo.  He occasionally takes meclizine. Past Medical History:  Diagnosis Date  . Anxiety   . Arthritis   . Asthma   . Bipolar 1 disorder ([place])   . Carpal tunnel syndrome on right 02/04/2016  . Chronic insomnia   . COPD (chronic obstructive pulmonary disease) ([place])   . Depression   . Diaphragmatic hernia   . Dizziness   . ED (erectile dysfunction)   . GERD (gastroesophageal reflux disease)   . HLD (hyperlipidemia)   . Hypothyroidism   . SOB (shortness of breath)   . Tobacco use   . Umbilical hernia without obstruction and without gangrene     Patient Active Problem List   Diagnosis Date Noted  . DKA (diabetic ketoacidoses) ([place]) 02/23/2019  . ARF (acute renal failure) ([place]) 02/23/2019  . Seizure-like activity ([place]) 08/23/2016  . Hyponatremia 08/18/2016  . Alcohol use disorder, moderate, dependence ([place]) 08/18/2016  . Bipolar disorder, current episode manic severe with psychotic features ([place]) 08/18/2016  . Bipolar affective disorder ([place]) 08/16/2016  . Carpal tunnel syndrome on right 02/04/2016  . Umbilical hernia [id number]  . Chest pain 03/31/2012  . GERD (gastroesophageal reflux disease) 03/06/2012  . Hypothyroidism 03/06/2012  . COPD (chronic obstructive pulmonary disease) ([place]) 03/06/2012    Past Surgical History:  Procedure Laterality Date  . COLONOSCOPY    . INSERTION  OF MESH N/A 01/01/2013   Procedure: INSERTION OF MESH;  Surgeon: Merrie Roof, MD;  Location: [facility];  Service: General;  Laterality: N/A;  . UMBILICAL HERNIA REPAIR N/A 01/01/2013   Procedure: HERNIA REPAIR UMBILICAL ADULT;  Surgeon: Merrie Roof, MD;  Location: [facility];  Service: General;  Laterality: N/A;  . UPPER GI ENDOSCOPY          Home Medications    Prior to Admission medications   Medication Sig Start Date End Date Taking? Authorizing Provider  albuterol (VENTOLIN HFA) 108 (90 Base) MCG/ACT inhaler Inhale 1-2 puffs into the lungs every 4 (four) hours as needed for wheezing or shortness of breath.  01/23/19   [provider]  clonazePAM (KLONOPIN) 1 MG tablet Take 1-2 mg by mouth 2 (two) times daily. Take one tablet during the day and two tablets at bedtime. 01/11/19   [provider]  esomeprazole (NEXIUM) 40 MG capsule Take 40 mg by mouth daily. 01/21/19   [provider]  indomethacin (INDOCIN) 25 MG capsule Take 25 mg by mouth 2 (two) times daily as needed (For gout.).     [provider]  insulin glargine (LANTUS) 100 unit/mL SOPN Inject 0.15 mLs (15 Units total) into the skin daily. 02/24/19   Julianne Rice, MD  Insulin Pen Needle 31G X 5 MM MISC Use to inject regular insulin three times daily 02/24/19   Julianne Rice, MD  insulin regular (NOVOLIN R) 100 units/mL injection  Inject 0.08 mLs (8 Units total) into the skin 3 (three) times daily before meals. 02/24/19   Julianne Rice, MD  Insulin Syringe-Needle U-100 (INSULIN SYRINGE .3CC/31GX5/16") 31G X 5/16" 0.3 ML MISC Use to inject regular insulin three times daily 02/24/19   Julianne Rice, MD  levothyroxine (SYNTHROID, LEVOTHROID) 150 MCG tablet Take 1 tablet (150 mcg total) by mouth every morning. For thyroid hormone replacement 08/27/16   Lindell Spar I, NP  meclizine (ANTIVERT) 25 MG tablet Take 12.5 mg by mouth 3 (three) times daily as needed for dizziness.  06/06/17   [provider]  metaxalone (SKELAXIN) 800 MG tablet Take 800 mg by mouth 4 (four) times daily as needed for muscle spasms.  08/30/18   [provider]  predniSONE (DELTASONE) 20 MG tablet Take 20 mg by mouth See admin instructions. Take one tablet three times daily for 3 days, then one tablet twice daily for 3 days, then one tablet daily for 3 days. 02/16/19   [provider]  QUEtiapine (SEROQUEL) 400 MG tablet Take 1 tablet (400 mg total) by mouth at bedtime. For mood control Patient taking differently: Take 800 mg by mouth at bedtime. For mood control 08/27/16   Lindell Spar I, NP  rosuvastatin (CRESTOR) 10 MG tablet Take 10 mg by mouth daily. 02/03/19   [provider]  traZODone (DESYREL) 100 MG tablet Take 1 tablet (100 mg total) by mouth at bedtime as needed for sleep. 08/27/16   Encarnacion Slates, NP    Family History Family History  Problem Relation Age of Onset  . Arrhythmia Other   . ALS Other   . Prostate cancer Maternal Grandfather   . Colon cancer Paternal Grandfather   . Heart disease Mother   . Arthritis Mother   . Mental illness Brother     Social History Social History   Tobacco Use  . Smoking status: Current Every Day Smoker    Packs/day: 2.00    Years: 40.00    Pack years: 80.00    Types: Cigarettes  . Smokeless tobacco: Never Used  Substance Use Topics  . Alcohol use: Yes    Comment: "Never, hardly one at all"  . Drug use: No     Allergies   Codeine, Lipitor [atorvastatin], Lithium carbonate [lithium], Pollen extract, and Talwin [pentazocine]   Review of Systems Review of Systems  Constitutional: Negative for chills and fever.  HENT: Negative for sore throat and trouble swallowing.   Eyes: Negative for visual disturbance.  Respiratory: Negative for cough and shortness of breath.   Cardiovascular: Negative for chest pain.  Gastrointestinal: Negative for abdominal pain, diarrhea, nausea and vomiting.  Endocrine: Positive for  polydipsia and polyuria.  Genitourinary: Positive for frequency. Negative for dysuria and hematuria.  Musculoskeletal: Negative for back pain and neck pain.  Skin: Negative for rash and wound.  Neurological: Positive for dizziness. Negative for weakness, light-headedness, numbness and headaches.  All other systems reviewed and are negative.    Physical Exam Updated Vital Signs BP (!) 148/76 (BP Location: Right Arm)   Pulse 89   Temp 98 F (36.7 C) (Oral)   Resp 19   SpO2 99%   Physical Exam Vitals signs and nursing note reviewed.  Constitutional:      Appearance: Normal appearance. He is well-developed.  HENT:     Head: Normocephalic and atraumatic.     Nose: Nose normal.     Mouth/Throat:     Mouth: Mucous membranes are moist.  Eyes:     Pupils: Pupils are equal, round, and reactive to light.     Comments: Few beats of fatigable nystagmus  Neck:     Musculoskeletal: Normal range of motion and neck supple. No neck rigidity or muscular tenderness.  Cardiovascular:     Rate and Rhythm: Normal rate and regular rhythm.     Heart sounds: No murmur. No friction rub. No gallop.   Pulmonary:     Effort: Pulmonary effort is normal. No respiratory distress.     Breath sounds: Normal breath sounds. No stridor. No wheezing, rhonchi or rales.  Chest:     Chest wall: No tenderness.  Abdominal:     General: Bowel sounds are normal.     Palpations: Abdomen is soft.     Tenderness: There is no abdominal tenderness. There is no guarding or rebound.  Musculoskeletal: Normal range of motion.        General: No swelling, tenderness, deformity or signs of injury.     Right lower leg: No edema.     Left lower leg: No edema.  Lymphadenopathy:     Cervical: No cervical adenopathy.  Skin:    General: Skin is warm and dry.     Findings: No erythema or rash.  Neurological:     General: No focal deficit present.     Mental Status: He is alert and oriented to person, place, and time.      Comments: Patient is alert and oriented x3 with clear, goal oriented speech. Patient has 5/5 motor in all extremities. Sensation is intact to light touch. Bilateral finger-to-nose is normal with no signs of dysmetria. Patient has a normal gait and walks without assistance.  Psychiatric:        Mood and Affect: Mood normal.        Behavior: Behavior normal.      ED Treatments / Results  Labs (all labs ordered are listed, but only abnormal results are displayed) Labs Reviewed  CBG MONITORING, ED - Abnormal; Notable for the following components:      Result Value   Glucose-Capillary 484 (*)    All other components within normal limits  CBG MONITORING, ED    EKG None  Radiology Ct Head Wo Contrast  Result Date: 02/23/2019 CLINICAL DATA:  Unexplained altered level of consciousness EXAM: CT HEAD WITHOUT CONTRAST TECHNIQUE: Contiguous axial images were obtained from the base of the skull through the vertex without intravenous contrast. COMPARISON:  08/20/2016 FINDINGS: Brain: No evidence of acute infarction, hemorrhage, hydrocephalus, extra-axial collection or mass lesion/mass effect. Vascular: No hyperdense vessel.  Atherosclerotic calcification. Skull: Normal. Negative for fracture or focal lesion. Sinuses/Orbits: No acute finding. IMPRESSION: Negative head CT. Electronically Signed   By: Monte Fantasia M.D.   On: 02/23/2019 05:08    Procedures Procedures (including critical care time)  Medications Ordered in ED Medications  insulin aspart (novoLOG) injection 10 Units (has no administration in time range)     Initial Impression / Assessment and Plan / ED Course  I have reviewed the triage vital signs and the nursing notes.  Pertinent labs & imaging results that were available during my care of the patient were reviewed by me and considered in my medical decision making (see chart for details).        Discussed with hospitalist, Dr. Lonny Prude who was treating the patient in the  hospital.  She recommended prescribing the patient a month supply of insulin needles, insulin syringes, Lantus pen 15 units daily,  regular insulin 8 units 3 times daily before meals.  Patient has already received diabetic education regarding the use of these while an inpatient.  Patient did have elevated blood sugar and given a dose of subcutaneous insulin while in the emergency department.  He understands the need to follow-up very closely with his primary provider and strict return precautions have been given.  Final Clinical Impressions(s) / ED Diagnoses   Final diagnoses:  Hyperglycemia    ED Discharge Orders         Ordered    insulin glargine (LANTUS) 100 unit/mL SOPN  Daily     02/24/19 1605    insulin regular (NOVOLIN R) 100 units/mL injection  3 times daily before meals     02/24/19 1605    Insulin Pen Needle 31G X 5 MM MISC     02/24/19 1605    Insulin Syringe-Needle U-100 (INSULIN SYRINGE .3CC/31GX5/16") 31G X 5/16" 0.3 ML MISC     02/24/19 1605           Julianne Rice, MD 02/24/19 [phone]

## 2019-02-25 ENCOUNTER — Encounter (HOSPITAL_COMMUNITY): Payer: Self-pay | Admitting: Emergency Medicine

## 2019-02-25 ENCOUNTER — Other Ambulatory Visit: Payer: Self-pay

## 2019-02-25 ENCOUNTER — Emergency Department (HOSPITAL_COMMUNITY)
Admission: EM | Admit: 2019-02-25 | Discharge: 2019-02-25 | Disposition: A | Discharge status: Home / Self Care | Payer: Medicaid Other | Attending: Emergency Medicine | Admitting: Emergency Medicine

## 2019-02-25 DIAGNOSIS — Z5321 Procedure and treatment not carried out due to patient leaving prior to being seen by health care provider: Secondary | ICD-10-CM | POA: Insufficient documentation

## 2019-02-25 DIAGNOSIS — E1165 Type 2 diabetes mellitus with hyperglycemia: Secondary | ICD-10-CM | POA: Diagnosis present

## 2019-02-25 LAB — CBG MONITORING, ED: Glucose-Capillary: 486 mg/dL — ABNORMAL HIGH (ref 70–99)

## 2019-02-25 NOTE — Discharge Summary (Signed)
[name] [name] [id number] DOB: 01/25/1956 DOA: 02/23/2019  PCP: Christain Sacramento, MD  Admit date: 02/23/2019 Discharge date: 10/18/2020AGAINST MEDICAL ADVICE   Discharge [place]   Brief/Interim Summary: [name] [name] is a 63 y.o. year old male with medical history significant for hypothyroidism, bipolar disorder, COPD and previous history of alcohol abuse  who presented on 02/23/2019 with plaints of nausea, increased thirst, EMS measured CBG 465, decreased oral intake patient reported worsening appetite, polydipsia, and nausea for the past week.  Of note patient was recently (10/9) started on azithromycin and prednisone for presumed acute bronchitis, he was advised to stop steroids on 10/14 due to losing balance and "bouncing off the walls".  In ED was found to have blood glucose of 600, anion gap of 28, pH of 7.2, creatinine of 1.52, UA with ketones and glucosuria consistent with DKA.  Patient was admitted to stepdown unit on insulin drip for treatment of new onset diabetes complicated by DKA, presumed type II. Remaining hospital course addressed in problem based format below:   Hospital Course:   New onset diabetes complicated by DKA, presumed type II.  Exacerbated in the setting of prednisone therapy, multiple bottles of Gatorade.  Likely all preceded by acute bronchitis A1c 12.5%.  With insulin drip anion gap closed, close monitoring of BMP.  Patient was started on Lantus 50 units and scheduled mealtime and obtaining some control of blood glucose.  However patient left AGAINST MEDICAL ADVICE before ensuring stable glucose regimen on 10/17.  He did return to ED 2 hours after leaving [facility] with hyperglycemia.  Discussed over the phone with Dr. Lita Mains to prescribe Lantus 15 units, regular insulin 8 units 3 times daily with meals, and close PCP follow-up.   Consultations:  None  Procedures/Studies: None   Discharge Diagnoses:  Principal  Problem:   DKA (diabetic ketoacidoses) ([place]) Active Problems:   GERD (gastroesophageal reflux disease)   Hypothyroidism   COPD (chronic obstructive pulmonary disease) ([facility])   Bipolar disorder, current episode manic severe with psychotic features ([place])   ARF (acute renal failure) ([place])    Discharge Instructions   Allergies as of 02/24/2019      Reactions   Codeine Nausea And Vomiting   Lipitor [atorvastatin] Other (See Comments)   Abdominal pains, Leg cramps   Lithium Carbonate [lithium] Other (See Comments)   Mental Status Changes   Pollen Extract Itching, Other (See Comments)   Itching Eyes & Congestion   Talwin [pentazocine] Other (See Comments)   Hallucination, shaking      Medication List    ASK your doctor about these medications   albuterol 108 (90 Base) MCG/ACT inhaler Commonly known as: VENTOLIN HFA Inhale 1-2 puffs into the lungs every 4 (four) hours as needed for wheezing or shortness of breath.   clonazePAM 1 MG tablet Commonly known as: KLONOPIN Take 1-2 mg by mouth 2 (two) times daily. Take one tablet during the day and two tablets at bedtime.   esomeprazole 40 MG capsule Commonly known as: NEXIUM Take 40 mg by mouth daily.   indomethacin 25 MG capsule Commonly known as: INDOCIN Take 25 mg by mouth 2 (two) times daily as needed (For gout.).   levothyroxine 150 MCG tablet Commonly known as: SYNTHROID Take 1 tablet (150 mcg total) by mouth every morning. For thyroid hormone replacement   meclizine 25 MG tablet Commonly known as: ANTIVERT Take 12.5 mg by mouth 3 (three) times daily as needed for dizziness.   metaxalone  800 MG tablet Commonly known as: SKELAXIN Take 800 mg by mouth 4 (four) times daily as needed for muscle spasms.   predniSONE 20 MG tablet Commonly known as: DELTASONE Take 20 mg by mouth See admin instructions. Take one tablet three times daily for 3 days, then one tablet twice daily for 3 days, then one tablet daily for 3 days.    QUEtiapine 400 MG tablet Commonly known as: SEROQUEL Take 1 tablet (400 mg total) by mouth at bedtime. For mood control Ask about: Which instructions should I use?   rosuvastatin 10 MG tablet Commonly known as: CRESTOR Take 10 mg by mouth daily.   traZODone 100 MG tablet Commonly known as: DESYREL Take 1 tablet (100 mg total) by mouth at bedtime as needed for sleep.       Allergies  Allergen Reactions  . Codeine Nausea And Vomiting  . Lipitor [Atorvastatin] Other (See Comments)    Abdominal pains, Leg cramps  . Lithium Carbonate [Lithium] Other (See Comments)    Mental Status Changes  . Pollen Extract Itching and Other (See Comments)    Itching Eyes & Congestion  . Talwin [Pentazocine] Other (See Comments)    Hallucination, shaking        The results of significant diagnostics from this hospitalization (including imaging, microbiology, ancillary and laboratory) are listed below for reference.     Microbiology: Recent Results (from the past 240 hour(s))  SARS CORONAVIRUS 2 (TAT 6-24 HRS) Nasopharyngeal Nasopharyngeal Swab     Status: None   Collection Time: 02/23/19  4:55 AM   Specimen: Nasopharyngeal Swab  Result Value Ref Range Status   SARS Coronavirus 2 NEGATIVE NEGATIVE Final    Comment: (NOTE) SARS-CoV-2 target nucleic acids are NOT DETECTED. The SARS-CoV-2 RNA is generally detectable in upper and lower respiratory specimens during the acute phase of infection. Negative results do not preclude SARS-CoV-2 infection, do not rule out co-infections with other pathogens, and should not be used as the sole basis for treatment or other patient management decisions. Negative results must be combined with clinical observations, patient history, and epidemiological information. The expected result is Negative. Fact Sheet for Patients: SugarRoll.be Fact Sheet for Healthcare Providers: https://www.woods-mathews.com/ This test  is not yet approved or cleared by the [place] FDA and  has been authorized for detection and/or diagnosis of SARS-CoV-2 by FDA under an Emergency Use Authorization (EUA). This EUA will remain  in effect (meaning this test can be used) for the duration of the COVID-19 declaration under Section 56 4(b)(1) of the Act, 21 U.S.C. section 360bbb-3(b)(1), unless the authorization is terminated or revoked sooner. Performed at [facility], [place] [address] [place], [place] [address]   MRSA PCR Screening     Status: Abnormal   Collection Time: 02/23/19 10:16 AM   Specimen: Nasal Mucosa; Nasopharyngeal  Result Value Ref Range Status   MRSA by PCR POSITIVE (A) NEGATIVE Final    Comment:        The GeneXpert MRSA Assay (FDA approved for NASAL specimens only), is one component of a comprehensive MRSA colonization surveillance program. It is not intended to diagnose MRSA infection nor to guide or monitor treatment for MRSA infections. RESULT CALLED TO, READ BACK BY AND VERIFIED WITH: Waunita Schooner [id number] @ [id number] [place] Performed at [facility], [place] [address] [place], [place] [address]      Labs: BNP (last 3 results) No results for input(s): BNP in the last 8760 hours. Basic  Metabolic Panel: Recent Labs  Lab 02/23/19 0609 02/23/19 1049 02/23/19 1336 02/23/19 1746 02/23/19 2210  NA 133* 133* 133* 133* 131*  K 3.8 3.8 3.2* 3.7 3.9  CL 99 102 101 103 104  CO2 13* 13* 16* 18* 19*  GLUCOSE 266* 183* 219* 154* 138*  BUN 38* 34* 31* 25* 22  CREATININE 1.26* 0.94 0.96 0.84 0.88  CALCIUM 8.5* 8.4* 8.4* 8.4* 8.2*   Liver Function Tests: No results for input(s): AST, ALT, ALKPHOS, BILITOT, PROT, ALBUMIN in the last 168 hours. No results for input(s): LIPASE, AMYLASE in the last 168 hours. No results for input(s): AMMONIA in the last 168 hours. CBC: Recent Labs  Lab 02/23/19 0145 02/23/19 0609 02/24/19 0236  WBC 10.6* 13.2* 11.4*   NEUTROABS  --  7.2  --   HGB 16.1 15.6 13.6  HCT 47.0 46.3 40.1  MCV 88.0 88.5 88.1  PLT 284 273 226   Cardiac Enzymes: No results for input(s): CKTOTAL, CKMB, CKMBINDEX, TROPONINI in the last 168 hours. BNP: Invalid input(s): POCBNP CBG: Recent Labs  Lab 02/23/19 2252 02/24/19 0738 02/24/19 1149 02/24/19 1456 02/24/19 1732  GLUCAP 154* 282* 251* 484* 375*   D-Dimer No results for input(s): DDIMER in the last 72 hours. Hgb A1c Recent Labs    02/23/19 0609  HGBA1C 12.5*   Lipid Profile No results for input(s): CHOL, HDL, LDLCALC, TRIG, CHOLHDL, LDLDIRECT in the last 72 hours. Thyroid function studies No results for input(s): TSH, T4TOTAL, T3FREE, THYROIDAB in the last 72 hours.  Invalid input(s): FREET3 Anemia work up No results for input(s): VITAMINB12, FOLATE, FERRITIN, TIBC, IRON, RETICCTPCT in the last 72 hours. Urinalysis    Component Value Date/Time   COLORURINE STRAW (A) 02/23/2019 0102   APPEARANCEUR CLEAR 02/23/2019 0102   LABSPEC 1.024 02/23/2019 0102   PHURINE 5.0 02/23/2019 0102   GLUCOSEU >=500 (A) 02/23/2019 0102   HGBUR SMALL (A) 02/23/2019 0102   BILIRUBINUR NEGATIVE 02/23/2019 0102   KETONESUR 80 (A) 02/23/2019 0102   PROTEINUR NEGATIVE 02/23/2019 0102   UROBILINOGEN 0.2 05/27/2009 0141   NITRITE NEGATIVE 02/23/2019 0102   LEUKOCYTESUR NEGATIVE 02/23/2019 0102   Sepsis Labs Invalid input(s): PROCALCITONIN,  WBC,  LACTICIDVEN Microbiology Recent Results (from the past 240 hour(s))  SARS CORONAVIRUS 2 (TAT 6-24 HRS) Nasopharyngeal Nasopharyngeal Swab     Status: None   Collection Time: 02/23/19  4:55 AM   Specimen: Nasopharyngeal Swab  Result Value Ref Range Status   SARS Coronavirus 2 NEGATIVE NEGATIVE Final    Comment: (NOTE) SARS-CoV-2 target nucleic acids are NOT DETECTED. The SARS-CoV-2 RNA is generally detectable in upper and lower respiratory specimens during the acute phase of infection. Negative results do not preclude  SARS-CoV-2 infection, do not rule out co-infections with other pathogens, and should not be used as the sole basis for treatment or other patient management decisions. Negative results must be combined with clinical observations, patient history, and epidemiological information. The expected result is Negative. Fact Sheet for Patients: SugarRoll.be Fact Sheet for Healthcare Providers: https://www.woods-mathews.com/ This test is not yet approved or cleared by the [place] FDA and  has been authorized for detection and/or diagnosis of SARS-CoV-2 by FDA under an Emergency Use Authorization (EUA). This EUA will remain  in effect (meaning this test can be used) for the duration of the COVID-19 declaration under Section 56 4(b)(1) of the Act, 21 U.S.C. section 360bbb-3(b)(1), unless the authorization is terminated or revoked sooner. Performed at [facility], [place]  [address] [place], [place] [address]   MRSA PCR Screening     Status: Abnormal   Collection Time: 02/23/19 10:16 AM   Specimen: Nasal Mucosa; Nasopharyngeal  Result Value Ref Range Status   MRSA by PCR POSITIVE (A) NEGATIVE Final    Comment:        The GeneXpert MRSA Assay (FDA approved for NASAL specimens only), is one component of a comprehensive MRSA colonization surveillance program. It is not intended to diagnose MRSA infection nor to guide or monitor treatment for MRSA infections. RESULT CALLED TO, READ BACK BY AND VERIFIED WITH: Waunita Schooner [id number] @ [id number] [place] Performed at [facility], [place] [address] [place], [place] [address]       SIGNED:   Desiree Hane, MD  [facility] 02/25/2019, 4:27 PM Pager   If 7PM-7AM, please contact night-coverage [email or web address] Password [email or web address]

## 2019-02-25 NOTE — ED Triage Notes (Signed)
Per pt, states he was here yesterday for same symptom-states he has his morning dose but pharmacy didn't have the dose he needs to take at night-decided to come to ED because we have all the insulin he needs to take-noncompliant with diet and meds-asymptomatic

## 2019-04-20 ENCOUNTER — Emergency Department (HOSPITAL_COMMUNITY): Payer: Medicaid Other

## 2019-04-20 ENCOUNTER — Encounter (HOSPITAL_COMMUNITY): Payer: Self-pay | Admitting: Emergency Medicine

## 2019-04-20 ENCOUNTER — Emergency Department (HOSPITAL_COMMUNITY)
Admission: EM | Admit: 2019-04-20 | Discharge: 2019-04-20 | Disposition: A | Discharge status: Home / Self Care | Payer: Medicaid Other | Attending: Emergency Medicine | Admitting: Emergency Medicine

## 2019-04-20 DIAGNOSIS — M25511 Pain in right shoulder: Secondary | ICD-10-CM | POA: Insufficient documentation

## 2019-04-20 DIAGNOSIS — E039 Hypothyroidism, unspecified: Secondary | ICD-10-CM | POA: Diagnosis not present

## 2019-04-20 DIAGNOSIS — Z888 Allergy status to other drugs, medicaments and biological substances status: Secondary | ICD-10-CM | POA: Diagnosis not present

## 2019-04-20 DIAGNOSIS — J449 Chronic obstructive pulmonary disease, unspecified: Secondary | ICD-10-CM | POA: Insufficient documentation

## 2019-04-20 DIAGNOSIS — M542 Cervicalgia: Secondary | ICD-10-CM | POA: Insufficient documentation

## 2019-04-20 DIAGNOSIS — E119 Type 2 diabetes mellitus without complications: Secondary | ICD-10-CM | POA: Insufficient documentation

## 2019-04-20 DIAGNOSIS — Z79899 Other long term (current) drug therapy: Secondary | ICD-10-CM | POA: Insufficient documentation

## 2019-04-20 DIAGNOSIS — F17210 Nicotine dependence, cigarettes, uncomplicated: Secondary | ICD-10-CM | POA: Insufficient documentation

## 2019-04-20 DIAGNOSIS — Z885 Allergy status to narcotic agent status: Secondary | ICD-10-CM | POA: Diagnosis not present

## 2019-04-20 DIAGNOSIS — Z794 Long term (current) use of insulin: Secondary | ICD-10-CM | POA: Diagnosis not present

## 2019-04-20 MED ORDER — OXYCODONE-ACETAMINOPHEN 5-325 MG PO TABS
1.0000 | ORAL_TABLET | Freq: Once | ORAL | Status: AC
  Administered 2019-04-20: 1 via ORAL
  Filled 2019-04-20: qty 1

## 2019-04-20 MED ORDER — LIDOCAINE 5 % EX PTCH: 1.0000 | MEDICATED_PATCH | CUTANEOUS | 0 refills | Status: AC

## 2019-04-20 MED ORDER — METHOCARBAMOL 500 MG PO TABS: 500.0000 mg | ORAL_TABLET | Freq: Two times a day (BID) | ORAL | 0 refills | Status: AC
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
## 2019-04-20 NOTE — Progress Notes (Signed)
Orthopedic Tech Progress Note Patient Details:  [name] [name] 1956/01/24 [id number]  Ortho Devices Type of Ortho Device: Shoulder immobilizer Ortho Device/Splint Location: RUE Ortho Device/Splint Interventions: Application, Ordered   Post Interventions Patient Tolerated: Well Instructions Provided: Care of device, Adjustment of device   Janit Pagan 04/20/2019, 3:27 PM
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
## 2019-04-20 NOTE — ED Notes (Signed)
Pt transported to xray 
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
## 2019-07-22 ENCOUNTER — Emergency Department (HOSPITAL_COMMUNITY)
Admission: EM | Admit: 2019-07-22 | Discharge: 2019-07-22 | Disposition: A | Discharge status: Home / Self Care | Payer: Medicaid Other | Attending: Emergency Medicine | Admitting: Emergency Medicine

## 2019-07-22 ENCOUNTER — Encounter (HOSPITAL_COMMUNITY): Payer: Self-pay

## 2019-07-22 ENCOUNTER — Other Ambulatory Visit: Payer: Self-pay

## 2019-07-22 DIAGNOSIS — E1165 Type 2 diabetes mellitus with hyperglycemia: Secondary | ICD-10-CM | POA: Insufficient documentation

## 2019-07-22 DIAGNOSIS — Z5321 Procedure and treatment not carried out due to patient leaving prior to being seen by health care provider: Secondary | ICD-10-CM | POA: Diagnosis not present

## 2019-07-22 DIAGNOSIS — Z7984 Long term (current) use of oral hypoglycemic drugs: Secondary | ICD-10-CM | POA: Insufficient documentation

## 2019-07-22 LAB — CBC
HCT: 40 % (ref 39.0–52.0)
Hemoglobin: 13.3 g/dL (ref 13.0–17.0)
MCH: 28.7 pg (ref 26.0–34.0)
MCHC: 33.3 g/dL (ref 30.0–36.0)
MCV: 86.4 fL (ref 80.0–100.0)
Platelets: 241 10*3/uL (ref 150–400)
RBC: 4.63 MIL/uL (ref 4.22–5.81)
RDW: 15 % (ref 11.5–15.5)
WBC: 9.5 10*3/uL (ref 4.0–10.5)
nRBC: 0 % (ref 0.0–0.2)

## 2019-07-22 LAB — BASIC METABOLIC PANEL
Anion gap: 12 (ref 5–15)
BUN: 10 mg/dL (ref 8–23)
CO2: 20 mmol/L — ABNORMAL LOW (ref 22–32)
Calcium: 8.4 mg/dL — ABNORMAL LOW (ref 8.9–10.3)
Chloride: 102 mmol/L (ref 98–111)
Creatinine, Ser: 1.08 mg/dL (ref 0.61–1.24)
GFR calc Af Amer: >60 mL/min (ref >60)
GFR calc non Af Amer: >60 mL/min (ref >60)
Glucose, Bld: 220 mg/dL — ABNORMAL HIGH (ref 70–99)
Potassium: 4.5 mmol/L (ref 3.5–5.1)
Sodium: 134 mmol/L — ABNORMAL LOW (ref 135–145)

## 2019-07-22 LAB — CBG MONITORING, ED: Glucose-Capillary: 206 mg/dL — ABNORMAL HIGH (ref 70–99)

## 2019-07-22 NOTE — ED Triage Notes (Signed)
Pt found his sugar at home to be over 400 this morning. He states that he read on the Internet to come in if it was over 240. He reports that he took metformin and glipizide PTA. A&Ox4. Denies N/V/D or abdominal pain.

## 2019-07-22 NOTE — ED Notes (Signed)
Requested urine from patient and he states he is ready to go. Notified md.

## 2019-07-22 NOTE — ED Notes (Signed)
Patient states he has to go to work and walked out.

## 2020-01-07 ENCOUNTER — Emergency Department (HOSPITAL_COMMUNITY): Payer: Medicaid Other

## 2020-01-07 ENCOUNTER — Emergency Department (HOSPITAL_COMMUNITY)
Admission: EM | Admit: 2020-01-07 | Discharge: 2020-01-08 | Disposition: A | Discharge status: Home / Self Care | Payer: Medicaid Other | Attending: Emergency Medicine | Admitting: Emergency Medicine

## 2020-01-07 ENCOUNTER — Encounter (HOSPITAL_COMMUNITY): Payer: Self-pay

## 2020-01-07 ENCOUNTER — Other Ambulatory Visit: Payer: Self-pay

## 2020-01-07 DIAGNOSIS — Y929 Unspecified place or not applicable: Secondary | ICD-10-CM | POA: Diagnosis not present

## 2020-01-07 DIAGNOSIS — M25562 Pain in left knee: Secondary | ICD-10-CM | POA: Diagnosis not present

## 2020-01-07 DIAGNOSIS — Y999 Unspecified external cause status: Secondary | ICD-10-CM | POA: Diagnosis not present

## 2020-01-07 DIAGNOSIS — W19XXXA Unspecified fall, initial encounter: Secondary | ICD-10-CM | POA: Diagnosis not present

## 2020-01-07 DIAGNOSIS — Z5321 Procedure and treatment not carried out due to patient leaving prior to being seen by health care provider: Secondary | ICD-10-CM | POA: Diagnosis not present

## 2020-01-07 DIAGNOSIS — Y939 Activity, unspecified: Secondary | ICD-10-CM | POA: Diagnosis not present

## 2020-01-07 DIAGNOSIS — M25532 Pain in left wrist: Secondary | ICD-10-CM | POA: Insufficient documentation
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
## 2021-05-20 ENCOUNTER — Telehealth: Payer: Self-pay

## 2021-05-20 NOTE — Telephone Encounter (Signed)
NOTES SCANNED TO REFERRAL 

## 2021-10-29 ENCOUNTER — Other Ambulatory Visit: Payer: Self-pay

## 2021-10-29 ENCOUNTER — Emergency Department (HOSPITAL_COMMUNITY): Payer: Medicare Other

## 2021-10-29 ENCOUNTER — Encounter (HOSPITAL_COMMUNITY): Payer: Self-pay

## 2021-10-29 ENCOUNTER — Emergency Department (HOSPITAL_COMMUNITY)
Admission: EM | Admit: 2021-10-29 | Discharge: 2021-10-29 | Disposition: A | Discharge status: Home / Self Care | Payer: Medicare Other | Attending: Emergency Medicine | Admitting: Emergency Medicine

## 2021-10-29 DIAGNOSIS — E039 Hypothyroidism, unspecified: Secondary | ICD-10-CM | POA: Insufficient documentation

## 2021-10-29 DIAGNOSIS — M25561 Pain in right knee: Secondary | ICD-10-CM

## 2021-10-29 DIAGNOSIS — E119 Type 2 diabetes mellitus without complications: Secondary | ICD-10-CM | POA: Insufficient documentation

## 2021-10-29 DIAGNOSIS — M2391 Unspecified internal derangement of right knee: Secondary | ICD-10-CM | POA: Insufficient documentation

## 2021-10-29 DIAGNOSIS — J449 Chronic obstructive pulmonary disease, unspecified: Secondary | ICD-10-CM | POA: Insufficient documentation

## 2021-10-29 DIAGNOSIS — M25562 Pain in left knee: Secondary | ICD-10-CM | POA: Diagnosis not present

## 2021-10-29 DIAGNOSIS — Z794 Long term (current) use of insulin: Secondary | ICD-10-CM | POA: Insufficient documentation

## 2021-10-29 DIAGNOSIS — W000XXA Fall on same level due to ice and snow, initial encounter: Secondary | ICD-10-CM | POA: Diagnosis not present

## 2021-10-29 DIAGNOSIS — Z79899 Other long term (current) drug therapy: Secondary | ICD-10-CM | POA: Diagnosis not present

## 2021-10-29 HISTORY — DX: Type 2 diabetes mellitus without complications: E11.9

## 2021-10-29 MED ORDER — HYDROCODONE-ACETAMINOPHEN 5-325 MG PO TABS
1.0000 | ORAL_TABLET | Freq: Once | ORAL | Status: AC
  Administered 2021-10-29: 1 via ORAL
  Filled 2021-10-29: qty 1

## 2021-10-29 MED ORDER — HYDROCODONE-ACETAMINOPHEN 5-325 MG PO TABS: 1.0000 | ORAL_TABLET | ORAL | 0 refills | Status: DC | PRN

## 2021-10-29 MED ORDER — MORPHINE SULFATE (PF) 4 MG/ML IV SOLN: 4.0000 mg | Freq: Once | INTRAVENOUS | Status: DC

## 2021-10-29 MED ORDER — CELECOXIB 200 MG PO CAPS: 200.0000 mg | ORAL_CAPSULE | Freq: Two times a day (BID) | ORAL | 0 refills | Status: AC
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
## 2021-10-29 NOTE — ED Provider Triage Note (Cosign Needed)
Emergency Medicine Provider Triage Evaluation Note  [name] [name] , a 66 y.o. male  was evaluated in triage.  Pt complains of bilateral knee pain.  Patient states that 2 weeks ago he slipped and feels that he injured both knees.  States that he has an appointment with orthopedics on the 26th but that the pain has become too severe for his over-the-counter medication.  Denies hitting head, denies loss of consciousness  Review of Systems  Positive: Knee pain Negative: Loss Of consciousness  Physical Exam  BP (!) 154/83 (BP Location: Right Arm)   Pulse 84   Temp 97.7 F (36.5 C) (Oral)   Resp 20   Ht '5\' 5"'$  (1.651 m)   Wt 73.5 kg   SpO2 94%   BMI 26.96 kg/m  Gen:   Awake, no distress   Resp:  Normal effort  MSK:   Moves extremities without difficulty  Other:    Medical Decision Making  Medically screening exam initiated at 9:58 AM.  Appropriate orders placed.  [name] [name] was informed that the remainder of the evaluation will be completed by another provider, this initial triage assessment does not replace that evaluation, and the importance of remaining in the ED until their evaluation is complete.     Dorothyann Peng, [place] 10/29/21 [phone]

## 2021-12-04 ENCOUNTER — Emergency Department (HOSPITAL_COMMUNITY)
Admission: EM | Admit: 2021-12-04 | Discharge: 2021-12-05 | Disposition: A | Discharge status: Home / Self Care | Payer: Medicare Other | Attending: Emergency Medicine | Admitting: Emergency Medicine

## 2021-12-04 ENCOUNTER — Emergency Department (HOSPITAL_COMMUNITY): Payer: Medicare Other

## 2021-12-04 ENCOUNTER — Other Ambulatory Visit: Payer: Self-pay

## 2021-12-04 ENCOUNTER — Encounter (HOSPITAL_COMMUNITY): Payer: Self-pay | Admitting: Emergency Medicine

## 2021-12-04 DIAGNOSIS — J449 Chronic obstructive pulmonary disease, unspecified: Secondary | ICD-10-CM | POA: Diagnosis not present

## 2021-12-04 DIAGNOSIS — Y9389 Activity, other specified: Secondary | ICD-10-CM | POA: Insufficient documentation

## 2021-12-04 DIAGNOSIS — M545 Low back pain, unspecified: Secondary | ICD-10-CM | POA: Insufficient documentation

## 2021-12-04 DIAGNOSIS — E119 Type 2 diabetes mellitus without complications: Secondary | ICD-10-CM | POA: Insufficient documentation

## 2021-12-04 DIAGNOSIS — S62634B Displaced fracture of distal phalanx of right ring finger, initial encounter for open fracture: Secondary | ICD-10-CM | POA: Diagnosis not present

## 2021-12-04 DIAGNOSIS — Z794 Long term (current) use of insulin: Secondary | ICD-10-CM | POA: Insufficient documentation

## 2021-12-04 DIAGNOSIS — W28XXXA Contact with powered lawn mower, initial encounter: Secondary | ICD-10-CM | POA: Diagnosis not present

## 2021-12-04 DIAGNOSIS — S6991XA Unspecified injury of right wrist, hand and finger(s), initial encounter: Secondary | ICD-10-CM | POA: Diagnosis present

## 2021-12-04 LAB — CBG MONITORING, ED: Glucose-Capillary: 126 mg/dL — ABNORMAL HIGH (ref 70–99)

## 2021-12-04 MED ORDER — CEFAZOLIN SODIUM-DEXTROSE 1-4 GM/50ML-% IV SOLN
1.0000 g | Freq: Once | INTRAVENOUS | Status: AC
  Administered 2021-12-04: 1 g via INTRAVENOUS
  Filled 2021-12-04: qty 50

## 2021-12-04 MED ORDER — OXYCODONE-ACETAMINOPHEN 5-325 MG PO TABS
1.0000 | ORAL_TABLET | Freq: Once | ORAL | Status: AC
  Administered 2021-12-04: 1 via ORAL
  Filled 2021-12-04: qty 1

## 2021-12-04 MED ORDER — LIDOCAINE HCL 2 % IJ SOLN
10.0000 mL | Freq: Once | INTRAMUSCULAR | Status: AC
  Administered 2021-12-04: 200 mg
  Filled 2021-12-04: qty 20

## 2021-12-04 NOTE — ED Provider Notes (Signed)
[facility] EMERGENC[facility] DEPARTMENT Provider Note   CSN: [id number] Arrival date & time: 12/04/21  2006     History {Add pertinent medical, surgical, social history, OB history to HPI:1} Chief Complaint  Patient presents with  . Finger Injury    URI [name] [name] is a 66 y.o. male.  HPI     Home Medications Prior to Admission medications   Medication Sig Start Date End Date Taking? Authorizing Provider  albuterol (VENTOLIN HFA) 108 (90 Base) MCG/ACT inhaler Inhale 1-2 puffs into the lungs every 4 (four) hours as needed for wheezing or shortness of breath.  01/23/19   [provider]  celecoxib (CELEBREX) 200 MG capsule Take 1 capsule (200 mg total) by mouth 2 (two) times daily. 10/29/21   Isla Pence, MD  clonazePAM (KLONOPIN) 1 MG tablet Take 1-2 mg by mouth 2 (two) times daily. Take one tablet during the day and two tablets at bedtime. 01/11/19   [provider]  esomeprazole (NEXIUM) 40 MG capsule Take 40 mg by mouth daily. 01/21/19   [provider]  HYDROcodone-acetaminophen (NORCO/VICODIN) 5-325 MG tablet Take 1 tablet by mouth every 4 (four) hours as needed. 10/29/21   Isla Pence, MD  indomethacin (INDOCIN) 25 MG capsule Take 25 mg by mouth 2 (two) times daily as needed (For gout.).     [provider]  insulin glargine (LANTUS) 100 unit/mL SOPN Inject 0.15 mLs (15 Units total) into the skin daily. 02/24/19   Julianne Rice, MD  Insulin Pen Needle 31G X 5 MM MISC Use to inject regular insulin three times daily 02/24/19   Julianne Rice, MD  insulin regular (NOVOLIN R) 100 units/mL injection Inject 0.08 mLs (8 Units total) into the skin 3 (three) times daily before meals. 02/24/19   Julianne Rice, MD  Insulin Syringe-Needle U-100 (INSULIN SYRINGE .3CC/31GX5/16") 31G X 5/16" 0.3 ML MISC Use to inject regular insulin three times daily 02/24/19   Julianne Rice, MD  levothyroxine (SYNTHROID, LEVOTHROID) 150 MCG tablet  Take 1 tablet (150 mcg total) by mouth every morning. For thyroid hormone replacement 08/27/16   Lindell Spar I, NP  lidocaine (LIDODERM) 5 % Place 1 patch onto the skin daily. Remove & Discard patch within 12 hours or as directed by MD 04/20/19   Henderly, Britni A, PA-C  meclizine (ANTIVERT) 25 MG tablet Take 12.5 mg by mouth 3 (three) times daily as needed for dizziness.  06/06/17   [provider]  metaxalone (SKELAXIN) 800 MG tablet Take 800 mg by mouth 4 (four) times daily as needed for muscle spasms.  08/30/18   [provider]  methocarbamol (ROBAXIN) 500 MG tablet Take 1 tablet (500 mg total) by mouth 2 (two) times daily. 04/20/19   Henderly, Britni A, PA-C  predniSONE (DELTASONE) 20 MG tablet Take 20 mg by mouth See admin instructions. Take one tablet three times daily for 3 days, then one tablet twice daily for 3 days, then one tablet daily for 3 days. 02/16/19   [provider]  QUEtiapine (SEROQUEL) 400 MG tablet Take 1 tablet (400 mg total) by mouth at bedtime. For mood control Patient taking differently: Take 800 mg by mouth at bedtime. For mood control 08/27/16   Lindell Spar I, NP  rosuvastatin (CRESTOR) 10 MG tablet Take 10 mg by mouth daily. 02/03/19   [provider]  traZODone (DESYREL) 100 MG tablet Take 1 tablet (100 mg total) by mouth at bedtime as needed for sleep. 08/27/16   Nwoko,  Loleta Dicker, NP      Allergies    Codeine, Lipitor [atorvastatin], Lithium carbonate [lithium], Pollen extract, and Talwin [pentazocine]    Review of Systems   Review of Systems  Physical Exam Updated Vital Signs BP 136/87 (BP Location: Left Arm)   Pulse 72   Temp 98.8 F (37.1 C) (Oral)   Resp 18   SpO2 95%  Physical Exam      ED Results / Procedures / Treatments   Labs (all labs ordered are listed, but only abnormal results are displayed) Labs Reviewed  CBG MONITORING, ED - Abnormal; Notable for the following components:      Result Value    Glucose-Capillary 126 (*)    All other components within normal limits    EKG None  Radiology DG Hand Complete Right  Result Date: 12/04/2021 CLINICAL DATA:  Laceration. EXAM: RIGHT HAND - COMPLETE 3+ VIEW COMPARISON:  None Available. FINDINGS: There is soft tissue laceration of the distal aspect of the fourth finger. The mid and distal aspect of the distal fourth phalanx is markedly comminuted and fractured. Fracture fragments are seen throughout lacerated soft tissues. Foreign body would be difficult to exclude given overlying bandage. No dislocation. IMPRESSION: 1. Markedly comminuted displaced fracture of the distal aspect of the fourth distal phalanx with overlying soft tissue laceration. Foreign bodies are difficult to exclude secondary to overlying bandage. Electronically Signed   By: Ronney Asters M.D.   On: 12/04/2021 22:04    Procedures Procedures  {Document cardiac monitor, telemetry assessment procedure when appropriate:1}  Medications Ordered in ED Medications  lidocaine (XYLOCAINE) 2 % (with pres) injection 200 mg (has no administration in time range)  oxyCODONE-acetaminophen (PERCOCET/ROXICET) 5-325 MG per tablet 1 tablet (1 tablet Oral Given 12/04/21 2207)    ED Course/ Medical Decision Making/ A&P                           Medical Decision Making Amount and/or Complexity of Data Reviewed Radiology: ordered.  Risk Prescription drug management.   ***  {Document critical care time when appropriate:1} {Document review of labs and clinical decision tools ie heart score, Chads2Vasc2 etc:1}  {Document your independent review of radiology images, and any outside records:1} {Document your discussion with family members, caretakers, and with consultants:1} {Document social determinants of health affecting pt's care:1} {Document your decision making why or why not admission, treatments were needed:1} Final Clinical Impression(s) / ED Diagnoses Final diagnoses:  None     Rx / DC Orders ED Discharge Orders     None
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
## 2021-12-04 NOTE — ED Notes (Signed)
Pt refuses to use urinal despite safety concern - pt ambulated to restroom with assistance

## 2021-12-04 NOTE — ED Notes (Addendum)
Pt to xray

## 2021-12-05 MED ORDER — CEPHALEXIN 500 MG PO CAPS: 500.0000 mg | ORAL_CAPSULE | Freq: Four times a day (QID) | ORAL | 0 refills | Status: AC

## 2021-12-05 MED ORDER — OXYCODONE-ACETAMINOPHEN 5-325 MG PO TABS: 1.0000 | ORAL_TABLET | Freq: Four times a day (QID) | ORAL | 0 refills | Status: DC | PRN

## 2021-12-05 NOTE — ED Notes (Signed)
Patient verbalizes understanding of discharge instructions. Opportunity for questioning and answers were provided. Armband removed by staff, pt discharged from ED ambulatory.   

## 2021-12-05 NOTE — ED Notes (Signed)
Pt refused discharge vitals stating "nah I feel fine"

## 2021-12-05 NOTE — ED Notes (Signed)
While this RN was taking pt upstairs pt ripped out his IV because "it was done" - Abx was finished infusing.  Pt also took bandage off of finger. Pt finger to be cleaned and bandaged prior to discharge

## 2021-12-05 NOTE — Discharge Instructions (Signed)
You are seen emergency department today for hand laceration.  As we discussed I talked to the hand surgeon he recommended that we bandaged this and you follow-up in the office.  I have attached the surgeons contact information for you to call and make an appointment, expect to have an appointment on Monday.  Continue to monitor how you're doing and return to the ER for new or worsening symptoms.

## 2022-03-03 ENCOUNTER — Encounter: Payer: Self-pay | Admitting: Internal Medicine
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
## 2022-12-01 ENCOUNTER — Encounter (HOSPITAL_COMMUNITY): Payer: Self-pay | Admitting: Emergency Medicine

## 2022-12-01 ENCOUNTER — Emergency Department (HOSPITAL_COMMUNITY)
Admission: EM | Admit: 2022-12-01 | Discharge: 2022-12-01 | Disposition: A | Discharge status: Home / Self Care | Payer: 59 | Attending: Emergency Medicine | Admitting: Emergency Medicine

## 2022-12-01 DIAGNOSIS — T22231A Burn of second degree of right upper arm, initial encounter: Secondary | ICD-10-CM | POA: Insufficient documentation

## 2022-12-01 DIAGNOSIS — X088XXD Exposure to other specified smoke, fire and flames, subsequent encounter: Secondary | ICD-10-CM | POA: Insufficient documentation

## 2022-12-01 DIAGNOSIS — T2121XA Burn of second degree of chest wall, initial encounter: Secondary | ICD-10-CM | POA: Insufficient documentation

## 2022-12-01 DIAGNOSIS — T300 Burn of unspecified body region, unspecified degree: Secondary | ICD-10-CM

## 2022-12-01 DIAGNOSIS — T2123XA Burn of second degree of upper back, initial encounter: Secondary | ICD-10-CM | POA: Insufficient documentation

## 2022-12-01 DIAGNOSIS — T310 Burns involving less than 10% of body surface: Secondary | ICD-10-CM | POA: Diagnosis not present

## 2022-12-01 MED ORDER — OXYCODONE-ACETAMINOPHEN 5-325 MG PO TABS: 1.0000 | ORAL_TABLET | Freq: Four times a day (QID) | ORAL | 0 refills | Status: DC | PRN

## 2022-12-01 MED ORDER — BACITRACIN ZINC 500 UNIT/GM EX OINT: 1.0000 | TOPICAL_OINTMENT | Freq: Two times a day (BID) | CUTANEOUS | 0 refills | Status: DC

## 2022-12-01 MED ORDER — BACITRACIN ZINC 500 UNIT/GM EX OINT
TOPICAL_OINTMENT | Freq: Once | CUTANEOUS | Status: AC
  Filled 2022-12-01: qty 0.9
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
## 2022-12-01 NOTE — ED Notes (Signed)
Pt is refusing Tetanus
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
## 2022-12-05 ENCOUNTER — Other Ambulatory Visit: Payer: Self-pay

## 2022-12-05 ENCOUNTER — Emergency Department (HOSPITAL_COMMUNITY): Admission: EM | Admit: 2022-12-05 | Payer: 59 | Source: Home / Self Care

## 2022-12-05 ENCOUNTER — Encounter (HOSPITAL_COMMUNITY): Payer: Self-pay | Admitting: Emergency Medicine

## 2022-12-05 DIAGNOSIS — Z5321 Procedure and treatment not carried out due to patient leaving prior to being seen by health care provider: Secondary | ICD-10-CM | POA: Diagnosis not present

## 2022-12-05 DIAGNOSIS — X58XXXA Exposure to other specified factors, initial encounter: Secondary | ICD-10-CM | POA: Insufficient documentation

## 2022-12-05 DIAGNOSIS — Y929 Unspecified place or not applicable: Secondary | ICD-10-CM | POA: Insufficient documentation

## 2022-12-05 DIAGNOSIS — T22031A Burn of unspecified degree of right upper arm, initial encounter: Secondary | ICD-10-CM | POA: Diagnosis present
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
## 2022-12-18 ENCOUNTER — Encounter (HOSPITAL_COMMUNITY): Payer: Self-pay

## 2022-12-18 ENCOUNTER — Emergency Department (HOSPITAL_COMMUNITY)
Admission: EM | Admit: 2022-12-18 | Discharge: 2022-12-18 | Disposition: A | Discharge status: Home / Self Care | Payer: 59 | Attending: Emergency Medicine | Admitting: Emergency Medicine

## 2022-12-18 ENCOUNTER — Other Ambulatory Visit: Payer: Self-pay

## 2022-12-18 DIAGNOSIS — T391X4A Poisoning by 4-Aminophenol derivatives, undetermined, initial encounter: Secondary | ICD-10-CM | POA: Diagnosis not present

## 2022-12-18 DIAGNOSIS — T50904A Poisoning by unspecified drugs, medicaments and biological substances, undetermined, initial encounter: Secondary | ICD-10-CM

## 2022-12-18 DIAGNOSIS — Z794 Long term (current) use of insulin: Secondary | ICD-10-CM | POA: Insufficient documentation

## 2022-12-18 DIAGNOSIS — T300 Burn of unspecified body region, unspecified degree: Secondary | ICD-10-CM

## 2022-12-18 LAB — COMPREHENSIVE METABOLIC PANEL
ALT: 12 U/L (ref 0–44)
AST: 13 U/L — ABNORMAL LOW (ref 15–41)
Albumin: 3.4 g/dL — ABNORMAL LOW (ref 3.5–5.0)
Alkaline Phosphatase: 45 U/L (ref 38–126)
Anion gap: 10 (ref 5–15)
BUN: 21 mg/dL (ref 8–23)
CO2: 19 mmol/L — ABNORMAL LOW (ref 22–32)
Calcium: 8.4 mg/dL — ABNORMAL LOW (ref 8.9–10.3)
Chloride: 104 mmol/L (ref 98–111)
Creatinine, Ser: 1.3 mg/dL — ABNORMAL HIGH (ref 0.61–1.24)
GFR, Estimated: >60 mL/min (ref >60)
Glucose, Bld: 118 mg/dL — ABNORMAL HIGH (ref 70–99)
Potassium: 3.9 mmol/L (ref 3.5–5.1)
Sodium: 133 mmol/L — ABNORMAL LOW (ref 135–145)
Total Bilirubin: 0.2 mg/dL — ABNORMAL LOW (ref 0.3–1.2)
Total Protein: 6.4 g/dL — ABNORMAL LOW (ref 6.5–8.1)

## 2022-12-18 LAB — CBC WITH DIFFERENTIAL/PLATELET
Abs Immature Granulocytes: 0.03 10*3/uL (ref 0.00–0.07)
Basophils Absolute: 0.1 10*3/uL (ref 0.0–0.1)
Basophils Relative: 0 %
Eosinophils Absolute: 0.6 10*3/uL — ABNORMAL HIGH (ref 0.0–0.5)
Eosinophils Relative: 6 %
HCT: 38.2 % — ABNORMAL LOW (ref 39.0–52.0)
Hemoglobin: 12.8 g/dL — ABNORMAL LOW (ref 13.0–17.0)
Immature Granulocytes: 0 %
Lymphocytes Relative: 45 %
Lymphs Abs: 5 10*3/uL — ABNORMAL HIGH (ref 0.7–4.0)
MCH: 30.4 pg (ref 26.0–34.0)
MCHC: 33.5 g/dL (ref 30.0–36.0)
MCV: 90.7 fL (ref 80.0–100.0)
Monocytes Absolute: 0.5 10*3/uL (ref 0.1–1.0)
Monocytes Relative: 5 %
Neutro Abs: 4.9 10*3/uL (ref 1.7–7.7)
Neutrophils Relative %: 44 %
Platelets: 271 10*3/uL (ref 150–400)
RBC: 4.21 MIL/uL — ABNORMAL LOW (ref 4.22–5.81)
RDW: 15.8 % — ABNORMAL HIGH (ref 11.5–15.5)
WBC: 11.1 10*3/uL — ABNORMAL HIGH (ref 4.0–10.5)
nRBC: 0 % (ref 0.0–0.2)

## 2022-12-18 LAB — ACETAMINOPHEN LEVEL
Acetaminophen (Tylenol), Serum: 20 ug/mL (ref 10–30)
Acetaminophen (Tylenol), Serum: 12 ug/mL (ref 10–30)

## 2022-12-18 LAB — ETHANOL: Alcohol, Ethyl (B): <10 mg/dL (ref <10)

## 2022-12-18 LAB — LIPASE, BLOOD: Lipase: 49 U/L (ref 11–51)

## 2022-12-18 LAB — SALICYLATE LEVEL: Salicylate Lvl: <7 mg/dL — ABNORMAL LOW (ref 7.0–30.0)

## 2022-12-18 MED ORDER — LIDOCAINE 5 % EX PTCH
2.0000 | MEDICATED_PATCH | CUTANEOUS | Status: DC
  Administered 2022-12-18: 2 via TRANSDERMAL
  Filled 2022-12-18: qty 2

## 2022-12-18 MED ORDER — LIDOCAINE 5 % EX PTCH: 1.0000 | MEDICATED_PATCH | CUTANEOUS | Status: DC

## 2022-12-18 MED ORDER — LIDOCAINE 5 % EX PTCH: 1.0000 | MEDICATED_PATCH | CUTANEOUS | 0 refills | Status: AC

## 2022-12-18 NOTE — ED Notes (Signed)
Pt received breakfast tray 

## 2022-12-18 NOTE — Discharge Instructions (Signed)
Do not take more than 3000 mg of Tylenol per 24-hour period.  Follow-up with your doctor.  Emergency department cannot give additional pain medications.  Return to the ED with difficulty breathing, chest pain, shortness of breath or other concerns.
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
## 2023-01-03 ENCOUNTER — Ambulatory Visit (HOSPITAL_BASED_OUTPATIENT_CLINIC_OR_DEPARTMENT_OTHER): Payer: 59 | Admitting: General Surgery

## 2023-01-31 ENCOUNTER — Encounter (HOSPITAL_BASED_OUTPATIENT_CLINIC_OR_DEPARTMENT_OTHER): Payer: 59 | Attending: General Surgery | Admitting: General Surgery

## 2023-01-31 DIAGNOSIS — T2113XA Burn of first degree of upper back, initial encounter: Secondary | ICD-10-CM | POA: Insufficient documentation

## 2023-01-31 DIAGNOSIS — E11622 Type 2 diabetes mellitus with other skin ulcer: Secondary | ICD-10-CM | POA: Insufficient documentation

## 2023-01-31 DIAGNOSIS — I10 Essential (primary) hypertension: Secondary | ICD-10-CM | POA: Insufficient documentation

## 2023-01-31 DIAGNOSIS — M1990 Unspecified osteoarthritis, unspecified site: Secondary | ICD-10-CM | POA: Insufficient documentation

## 2023-01-31 NOTE — Progress Notes (Signed)
AARN, BLOCKER R ([id number]) [id number] Page 1 of 6 Visit Report for 01/31/2023 Allergy List Details Patient Name: Date of Service: [name] [name], [name] [name] R. 01/31/2023 9:00 A M Medical Record Number: [id number] Patient Account Number: [id number] Date of Birth/Sex: Treating RN: 08/22/1955 (67 y.o. Damaris Schooner Primary Care Jaquavious Mercer: Barbie Banner Other Clinician: Referring Epic Tribbett: Treating Anaily Ashbaugh/Extender: Arvil Persons Weeks in Treatment: 0 Allergies Active Allergies codeine Reaction: nausea/vomiting Lipitor lithium carbonate pollen extracts Reaction: itching Talwin Allergy Notes Electronic Signature(s) Signed: 01/31/2023 4:32:44 PM By: Zenaida Deed RN, BSN Entered By: Zenaida Deed on 01/31/2023 06:13:24 -------------------------------------------------------------------------------- Arrival Information Details Patient Name: Date of Service: [name] [name], [name] 01/31/2023 9:00 A M Medical Record Number: [id number] Patient Account Number: [id number] Date of Birth/Sex: Treating RN: Nov 22, 1955 (67 y.o. Damaris Schooner Primary Care Netanya Yazdani: Barbie Banner Other Clinician: Referring Accalia Rigdon: Treating Jaysha Lasure/Extender: Arvil Persons Weeks in Treatment: 0 Visit Information Patient Arrived: Ambulatory Arrival Time: 09:24 Accompanied By: self Transfer Assistance: None Patient Identification Verified: Yes Secondary Verification Process Completed: Yes Patient Requires Transmission-Based Precautions: No Patient Has Alerts: No Electronic Signature(s) Signed: 01/31/2023 4:32:44 PM By: Zenaida Deed RN, BSN Entered By: Zenaida Deed on 01/31/2023 06:25:57 [name], [name] ([id number]) [id number] Page 2 of 6 -------------------------------------------------------------------------------- Clinic Level of Care Assessment Details Patient Name: Date of Service: [name] [name], [name]  01/31/2023 9:00 A M Medical Record Number: [id number] Patient Account Number: [id number] Date of Birth/Sex: Treating RN: 1956-01-05 (67 y.o. Damaris Schooner Primary Care Pollie Poma: Barbie Banner Other Clinician: Referring Brynlie Daza: Treating Aairah Negrette/Extender: Arvil Persons Weeks in Treatment: 0 Clinic Level of Care Assessment Items TOOL 2 Quantity Score []  - 0 Use when only an EandM is performed on the INITIAL visit ASSESSMENTS - Nursing Assessment / Reassessment X- 1 20 General Physical Exam (combine w/ comprehensive assessment (listed just below) when performed on new pt. evals) X- 1 25 Comprehensive Assessment (HX, ROS, Risk Assessments, Wounds Hx, etc.) ASSESSMENTS - Wound and Skin A ssessment / Reassessment []  - 0 Simple Wound Assessment / Reassessment - one wound []  - 0 Complex Wound Assessment / Reassessment - multiple wounds X- 1 10 Dermatologic / Skin Assessment (not related to wound area) ASSESSMENTS - Ostomy and/or Continence Assessment and Care []  - 0 Incontinence Assessment and Management []  - 0 Ostomy Care Assessment and Management (repouching, etc.) PROCESS - Coordination of Care X - Simple Patient / Family Education for ongoing care 1 15 []  - 0 Complex (extensive) Patient / Family Education for ongoing care X- 1 10 Staff obtains Chiropractor, Records, T Results / Process Orders est []  - 0 Staff telephones HHA, Nursing Homes / Clarify orders / etc []  - 0 Routine Transfer to another Facility (non-emergent condition) []  - 0 Routine Hospital Admission (non-emergent condition) X- 1 15 New Admissions / Manufacturing engineer / Ordering NPWT Apligraf, etc. , []  - 0 Emergency Hospital Admission (emergent condition) X- 1 10 Simple Discharge Coordination []  - 0 Complex (extensive) Discharge Coordination PROCESS - Special Needs []  - 0 Pediatric / Minor Patient Management []  - 0 Isolation Patient Management []  - 0 Hearing / Language /  Visual special needs []  - 0 Assessment of Community assistance (transportation, D/C planning, etc.) []  - 0 Additional assistance / Altered mentation []  - 0 Support Surface(s) Assessment (bed, cushion, seat, etc.) INTERVENTIONS - Wound Cleansing / Measurement []  - 0 Wound Imaging (photographs - any number of wounds) []  - 0 Wound Tracing (  instead of photographs) []  - 0 Simple Wound Measurement - one wound []  - 0 Complex Wound Measurement - multiple wounds []  - 0 Simple Wound Cleansing - one wound [name], [name] ([id number]) [id number] Page 3 of 6 []  - 0 Complex Wound Cleansing - multiple wounds INTERVENTIONS - Wound Dressings []  - 0 Small Wound Dressing one or multiple wounds []  - 0 Medium Wound Dressing one or multiple wounds []  - 0 Large Wound Dressing one or multiple wounds []  - 0 Application of Medications - injection INTERVENTIONS - Miscellaneous []  - 0 External ear exam []  - 0 Specimen Collection (cultures, biopsies, blood, body fluids, etc.) []  - 0 Specimen(s) / Culture(s) sent or taken to Lab for analysis []  - 0 Patient Transfer (multiple staff / Michiel Sites Lift / Similar devices) []  - 0 Simple Staple / Suture removal (25 or less) []  - 0 Complex Staple / Suture removal (26 or more) []  - 0 Hypo / Hyperglycemic Management (close monitor of Blood Glucose) []  - 0 Ankle / Brachial Index (ABI) - do not check if billed separately Has the patient been seen at the hospital within the last three years: Yes Total Score: 105 Level Of Care: New/Established - Level 3 Electronic Signature(s) Signed: 01/31/2023 4:32:44 PM By: Zenaida Deed RN, BSN Entered By: Zenaida Deed on 01/31/2023 06:49:36 -------------------------------------------------------------------------------- Encounter Discharge Information Details Patient Name: Date of Service: [name] [name], [name] 01/31/2023 9:00 A M Medical Record Number: [id number] Patient Account Number:  [id number] Date of Birth/Sex: Treating RN: [birth year] (67 y.o. Damaris Schooner Primary Care Tonae Livolsi: Barbie Banner Other Clinician: Referring Ahmon Tosi: Treating Tabias Swayze/Extender: Arvil Persons Weeks in Treatment: 0 Encounter Discharge Information Items Discharge Condition: Stable Ambulatory Status: Ambulatory Discharge Destination: Home Transportation: Private Auto Accompanied By: self Schedule Follow-up Appointment: Yes Clinical Summary of Care: Patient Declined Electronic Signature(s) Signed: 01/31/2023 4:32:44 PM By: Zenaida Deed RN, BSN Entered By: Zenaida Deed on 01/31/2023 06:50:28 -------------------------------------------------------------------------------- Lower Extremity Assessment Details Patient Name: Date of Service: [name] Lorina Rabon, [name] 01/31/2023 9:00 A Sonnie Alamo, Dailon R [phone] [id number] Page 4 of 6 Medical Record Number: [id number] Patient Account Number: [id number] Date of Birth/Sex: Treating RN: 07-15-1955 (67 y.o. Damaris Schooner Primary Care Elijiah Mickley: Barbie Banner Other Clinician: Referring Erilyn Pearman: Treating Luisfernando Brightwell/Extender: Arvil Persons Weeks in Treatment: 0 Electronic Signature(s) Signed: 01/31/2023 4:32:44 PM By: Zenaida Deed RN, BSN Entered By: Zenaida Deed on 01/31/2023 06:38:19 -------------------------------------------------------------------------------- Multi Wound Chart Details Patient Name: Date of Service: [name] [name], [name] 01/31/2023 9:00 A M Medical Record Number: [id number] Patient Account Number: [id number] Date of Birth/Sex: Treating RN: [birth year] (66 y.o. M) Primary Care Alveria Mcglaughlin: Barbie Banner Other Clinician: Referring Jayion Schneck: Treating Mery Guadalupe/Extender: Arvil Persons Weeks in Treatment: 0 Vital Signs Height(in): 65 Pulse(bpm): 83 Weight(lbs): 178 Blood Pressure(mmHg): 144/82 Body Mass Index(BMI):  29.6 Temperature(F): 98.0 Respiratory Rate(breaths/min): 18 [Treatment Notes:Wound Assessments Treatment Notes] Electronic Signature(s) Signed: 01/31/2023 9:52:57 AM By: Duanne Guess MD FACS Entered By: Duanne Guess on 01/31/2023 06:52:57 -------------------------------------------------------------------------------- Pain Assessment Details Patient Name: Date of Service: [name] [name], [name] 01/31/2023 9:00 A M Medical Record Number: [id number] Patient Account Number: [id number] Date of Birth/Sex: Treating RN: July 03, 1955 (67 y.o. Damaris Schooner Primary Care Nayali Talerico: Barbie Banner Other Clinician: Referring Eulan Heyward: Treating Deshon Koslowski/Extender: Arvil Persons Weeks in Treatment: 0 Active Problems Location of Pain Severity and Description of Pain Patient Has Paino Yes Site Locations Pain Location: [place], [place] R (  AARN, BLOCKER R ([id number]) [id number] Page 1 of 6 Visit Report for 01/31/2023 Allergy List Details Patient Name: Date of Service: [name] [name], [name] [name] R. 01/31/2023 9:00 A M Medical Record Number: [id number] Patient Account Number: [id number] Date of Birth/Sex: Treating RN: 08/22/1955 (67 y.o. Damaris Schooner Primary Care Jaquavious Mercer: Barbie Banner Other Clinician: Referring Epic Tribbett: Treating Anaily Ashbaugh/Extender: Arvil Persons Weeks in Treatment: 0 Allergies Active Allergies codeine Reaction: nausea/vomiting Lipitor lithium carbonate pollen extracts Reaction: itching Talwin Allergy Notes Electronic Signature(s) Signed: 01/31/2023 4:32:44 PM By: Zenaida Deed RN, BSN Entered By: Zenaida Deed on 01/31/2023 06:13:24 -------------------------------------------------------------------------------- Arrival Information Details Patient Name: Date of Service: [name] [name], [name] 01/31/2023 9:00 A M Medical Record Number: [id number] Patient Account Number: [id number] Date of Birth/Sex: Treating RN: Nov 22, 1955 (67 y.o. Damaris Schooner Primary Care Netanya Yazdani: Barbie Banner Other Clinician: Referring Accalia Rigdon: Treating Jaysha Lasure/Extender: Arvil Persons Weeks in Treatment: 0 Visit Information Patient Arrived: Ambulatory Arrival Time: 09:24 Accompanied By: self Transfer Assistance: None Patient Identification Verified: Yes Secondary Verification Process Completed: Yes Patient Requires Transmission-Based Precautions: No Patient Has Alerts: No Electronic Signature(s) Signed: 01/31/2023 4:32:44 PM By: Zenaida Deed RN, BSN Entered By: Zenaida Deed on 01/31/2023 06:25:57 [name], [name] ([id number]) [id number] Page 2 of 6 -------------------------------------------------------------------------------- Clinic Level of Care Assessment Details Patient Name: Date of Service: [name] [name], [name]  01/31/2023 9:00 A M Medical Record Number: [id number] Patient Account Number: [id number] Date of Birth/Sex: Treating RN: 1956-01-05 (67 y.o. Damaris Schooner Primary Care Pollie Poma: Barbie Banner Other Clinician: Referring Brynlie Daza: Treating Aairah Negrette/Extender: Arvil Persons Weeks in Treatment: 0 Clinic Level of Care Assessment Items TOOL 2 Quantity Score []  - 0 Use when only an EandM is performed on the INITIAL visit ASSESSMENTS - Nursing Assessment / Reassessment X- 1 20 General Physical Exam (combine w/ comprehensive assessment (listed just below) when performed on new pt. evals) X- 1 25 Comprehensive Assessment (HX, ROS, Risk Assessments, Wounds Hx, etc.) ASSESSMENTS - Wound and Skin A ssessment / Reassessment []  - 0 Simple Wound Assessment / Reassessment - one wound []  - 0 Complex Wound Assessment / Reassessment - multiple wounds X- 1 10 Dermatologic / Skin Assessment (not related to wound area) ASSESSMENTS - Ostomy and/or Continence Assessment and Care []  - 0 Incontinence Assessment and Management []  - 0 Ostomy Care Assessment and Management (repouching, etc.) PROCESS - Coordination of Care X - Simple Patient / Family Education for ongoing care 1 15 []  - 0 Complex (extensive) Patient / Family Education for ongoing care X- 1 10 Staff obtains Chiropractor, Records, T Results / Process Orders est []  - 0 Staff telephones HHA, Nursing Homes / Clarify orders / etc []  - 0 Routine Transfer to another Facility (non-emergent condition) []  - 0 Routine Hospital Admission (non-emergent condition) X- 1 15 New Admissions / Manufacturing engineer / Ordering NPWT Apligraf, etc. , []  - 0 Emergency Hospital Admission (emergent condition) X- 1 10 Simple Discharge Coordination []  - 0 Complex (extensive) Discharge Coordination PROCESS - Special Needs []  - 0 Pediatric / Minor Patient Management []  - 0 Isolation Patient Management []  - 0 Hearing / Language /  Visual special needs []  - 0 Assessment of Community assistance (transportation, D/C planning, etc.) []  - 0 Additional assistance / Altered mentation []  - 0 Support Surface(s) Assessment (bed, cushion, seat, etc.) INTERVENTIONS - Wound Cleansing / Measurement []  - 0 Wound Imaging (photographs - any number of wounds) []  - 0 Wound Tracing (

## 2023-01-31 NOTE — Progress Notes (Signed)
GURVIR, THANE R ([id number]) [id number] Page 1 of 4 Visit Report for 01/31/2023 Abuse Risk Screen Details Patient Name: Date of Service: [place], [place]. 01/31/2023 9:00 A M Medical Record Number: [id number] Patient Account Number: [id number] Date of Birth/Sex: Treating RN: Jan 22, 1956 (67 y.o. Damaris Schooner Primary Care Rhen Dossantos: Barbie Banner Other Clinician: Referring Nusrat Encarnacion: Treating Jayshon Dommer/Extender: Arvil Persons Weeks in Treatment: 0 Abuse Risk Screen Items Answer ABUSE RISK SCREEN: Has anyone close to you tried to hurt or harm you recentlyo No Do you feel uncomfortable with anyone in your familyo No Has anyone forced you do things that you didnt want to doo No Electronic Signature(s) Signed: 01/31/2023 4:32:44 PM By: Zenaida Deed RN, BSN Entered By: Zenaida Deed on 01/31/2023 06:34:26 -------------------------------------------------------------------------------- Activities of Daily Living Details Patient Name: Date of Service: [place], [place] RK R. 01/31/2023 9:00 A M Medical Record Number: [id number] Patient Account Number: [id number] Date of Birth/Sex: Treating RN: 01-Aug-1955 (67 y.o. Damaris Schooner Primary Care Nuria Phebus: Barbie Banner Other Clinician: Referring Lacinda Curvin: Treating Zolton Dowson/Extender: Arvil Persons Weeks in Treatment: 0 Activities of Daily Living Items Answer Activities of Daily Living (Please select one for each item) Drive Automobile Completely Able T Medications ake Completely Able Use T elephone Completely Able Care for Appearance Completely Able Use T oilet Completely Able Bath / Shower Completely Able Dress Self Completely Able Feed Self Completely Able Walk Completely Able Get In / Out Bed Completely Able Housework Completely Able Prepare Meals Completely Able Handle Money Completely Able Shop for Self Completely Able Electronic Signature(s) Signed:  01/31/2023 4:32:44 PM By: Zenaida Deed RN, BSN Entered By: Zenaida Deed on 01/31/2023 06:34:49 Jasper, Ryoma R ([id number]) [id number] Page 2 of 4 -------------------------------------------------------------------------------- Education Screening Details Patient Name: Date of Service: [place], [place] RK R. 01/31/2023 9:00 A M Medical Record Number: [id number] Patient Account Number: [id number] Date of Birth/Sex: Treating RN: 11/20/1955 (67 y.o. Damaris Schooner Primary Care Nycholas Rayner: Barbie Banner Other Clinician: Referring Analucia Hush: Treating Fynlee Rowlands/Extender: Arvil Persons Weeks in Treatment: 0 Primary Learner Assessed: Patient Learning Preferences/Education Level/Primary Language Learning Preference: Explanation, Demonstration, Printed Material Highest Education Level: College or Above Preferred Language: English Cognitive Barrier Language Barrier: No Translator Needed: No Memory Deficit: No Emotional Barrier: No Cultural/Religious Beliefs Affecting Medical Care: No Physical Barrier Impaired Vision: No Impaired Hearing: No Decreased Hand dexterity: No Knowledge/Comprehension Knowledge Level: High Comprehension Level: High Ability to understand written instructions: High Ability to understand verbal instructions: High Motivation Anxiety Level: Calm Cooperation: Cooperative Education Importance: Acknowledges Need Interest in Health Problems: Asks Questions Perception: Coherent Willingness to Engage in Self-Management High Activities: Readiness to Engage in Self-Management High Activities: Electronic Signature(s) Signed: 01/31/2023 4:32:44 PM By: Zenaida Deed RN, BSN Entered By: Zenaida Deed on 01/31/2023 06:35:18 -------------------------------------------------------------------------------- Fall Risk Assessment Details Patient Name: Date of Service: [place] Lorina Rabon, MA RK R. 01/31/2023 9:00 A M Medical Record  Number: [id number] Patient Account Number: [id number] Date of Birth/Sex: Treating RN: 1956-02-19 (67 y.o. Damaris Schooner Primary Care Jewel Venditto: Barbie Banner Other Clinician: Referring Frimy Uffelman: Treating Fiona Coto/Extender: Arvil Persons Weeks in Treatment: 0 Fall Risk Assessment Items Have you had 2 or more falls in the last 12 monthso 0 No Samudio, Elhadj R ([id number]) [id number] Page 3 of 4 Have you had any fall that resulted in injury in the last 12 monthso 0 No FALLS RISK SCREEN History of falling - immediate or within 3 months  0 No Secondary diagnosis (Do you have 2 or more medical diagnoseso) 0 No Ambulatory aid None/bed rest/wheelchair/nurse 0 Yes Crutches/cane/walker 0 No Furniture 0 No Intravenous therapy Access/Saline/Heparin Lock 0 No Gait/Transferring Normal/ bed rest/ wheelchair 0 Yes Weak (short steps with or without shuffle, stooped but able to lift head while walking, may seek 0 No support from furniture) Impaired (short steps with shuffle, may have difficulty arising from chair, head down, impaired 0 No balance) Mental Status Oriented to own ability 0 Yes Electronic Signature(s) Signed: 01/31/2023 4:32:44 PM By: Zenaida Deed RN, BSN Entered By: Zenaida Deed on 01/31/2023 06:35:29 -------------------------------------------------------------------------------- Foot Assessment Details Patient Name: Date of Service: [place], [place] RK R. 01/31/2023 9:00 A M Medical Record Number: [id number] Patient Account Number: [id number] Date of Birth/Sex: Treating RN: March 14, 1956 (67 y.o. Damaris Schooner Primary Care Sohrab Keelan: Barbie Banner Other Clinician: Referring Sincerity Cedar: Treating Mekisha Bittel/Extender: Arvil Persons Weeks in Treatment: 0 Foot Assessment Items Site Locations + = Sensation present, - = Sensation absent, C = Callus, U = Ulcer R = Redness, W = Warmth, M = Maceration, PU =  Pre-ulcerative lesion F = Fissure, S = Swelling, D = Dryness Assessment Right: Left: Other Deformity: No No Prior Foot Ulcer: No No Prior Amputation: No No Charcot Joint: No No Ambulatory Status: Ambulatory Without Help GaitJacquon Ragazzo, Alyssa R ([id number]) [id number] Page 4 of 4 Electronic Signature(s) Signed: 01/31/2023 4:32:44 PM By: Zenaida Deed RN, BSN Entered By: Zenaida Deed on 01/31/2023 06:38:12 -------------------------------------------------------------------------------- Nutrition Risk Screening Details Patient Name: Date of Service: [place] Lorina Rabon, [place] RK R. 01/31/2023 9:00 A M Medical Record Number: [id number] Patient Account Number: [id number] Date of Birth/Sex: Treating RN: Nov 26, 1955 (67 y.o. Damaris Schooner Primary Care Valeska Haislip: Barbie Banner Other Clinician: Referring Chelsy Parrales: Treating Acea Yagi/Extender: Arvil Persons Weeks in Treatment: 0 Height (in): 65 Weight (lbs): 178 Body Mass Index (BMI): 29.6 Nutrition Risk Screening Items Score Screening NUTRITION RISK SCREEN: I have an illness or condition that made me change the kind and/or amount of food I eat 0 No I eat fewer than two meals per day 0 No I eat few fruits and vegetables, or milk products 0 No I have three or more drinks of beer, liquor or wine almost every day 0 No I have tooth or mouth problems that make it hard for me to eat 0 No I don't always have enough money to buy the food I need 0 No I eat alone most of the time 1 Yes I take three or more different prescribed or over-the-counter drugs a day 0 No Without wanting to, I have lost or gained 10 pounds in the last six months 0 No I am not always physically able to shop, cook and/or feed myself 0 No Nutrition Protocols Good Risk Protocol 0 No interventions needed Moderate Risk Protocol High Risk Proctocol Risk Level: Good Risk Score: 1 Electronic Signature(s) Signed: 01/31/2023  4:32:44 PM By: Zenaida Deed RN, BSN Entered By: Zenaida Deed on 01/31/2023 06:38:04
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
## 2023-04-05 ENCOUNTER — Emergency Department (HOSPITAL_COMMUNITY)
Admission: EM | Admit: 2023-04-05 | Discharge: 2023-04-05 | Disposition: A | Discharge status: Home / Self Care | Payer: 59 | Attending: Emergency Medicine | Admitting: Emergency Medicine

## 2023-04-05 ENCOUNTER — Other Ambulatory Visit: Payer: Self-pay

## 2023-04-05 ENCOUNTER — Encounter (HOSPITAL_COMMUNITY): Payer: Self-pay

## 2023-04-05 DIAGNOSIS — Z79890 Hormone replacement therapy (postmenopausal): Secondary | ICD-10-CM | POA: Diagnosis not present

## 2023-04-05 DIAGNOSIS — E039 Hypothyroidism, unspecified: Secondary | ICD-10-CM | POA: Diagnosis not present

## 2023-04-05 DIAGNOSIS — J449 Chronic obstructive pulmonary disease, unspecified: Secondary | ICD-10-CM | POA: Diagnosis not present

## 2023-04-05 DIAGNOSIS — T310 Burns involving less than 10% of body surface: Secondary | ICD-10-CM | POA: Insufficient documentation

## 2023-04-05 DIAGNOSIS — X102XXA Contact with fats and cooking oils, initial encounter: Secondary | ICD-10-CM | POA: Insufficient documentation

## 2023-04-05 DIAGNOSIS — Y929 Unspecified place or not applicable: Secondary | ICD-10-CM | POA: Diagnosis not present

## 2023-04-05 DIAGNOSIS — Y93G3 Activity, cooking and baking: Secondary | ICD-10-CM | POA: Diagnosis not present

## 2023-04-05 DIAGNOSIS — I2510 Atherosclerotic heart disease of native coronary artery without angina pectoris: Secondary | ICD-10-CM | POA: Insufficient documentation

## 2023-04-05 DIAGNOSIS — T23061A Burn of unspecified degree of back of right hand, initial encounter: Secondary | ICD-10-CM | POA: Diagnosis present

## 2023-04-05 DIAGNOSIS — T23161A Burn of first degree of back of right hand, initial encounter: Secondary | ICD-10-CM | POA: Insufficient documentation

## 2023-04-05 DIAGNOSIS — Z794 Long term (current) use of insulin: Secondary | ICD-10-CM | POA: Insufficient documentation

## 2023-04-05 MED ORDER — BACITRACIN ZINC 500 UNIT/GM EX OINT
1.0000 | TOPICAL_OINTMENT | Freq: Two times a day (BID) | CUTANEOUS | 0 refills | Status: AC
Start: 2023-04-05 — End: 2023-04-12

## 2023-04-05 MED ORDER — OXYCODONE-ACETAMINOPHEN 5-325 MG PO TABS
1.0000 | ORAL_TABLET | Freq: Once | ORAL | Status: AC
  Administered 2023-04-05: 1 via ORAL
  Filled 2023-04-05: qty 1

## 2023-04-05 MED ORDER — KETOROLAC TROMETHAMINE 60 MG/2ML IM SOLN
15.0000 mg | Freq: Once | INTRAMUSCULAR | Status: AC
  Administered 2023-04-05: 15 mg via INTRAMUSCULAR
  Filled 2023-04-05: qty 2

## 2023-04-05 MED ORDER — OXYCODONE-ACETAMINOPHEN 5-325 MG PO TABS: 1.0000 | ORAL_TABLET | Freq: Three times a day (TID) | ORAL | 0 refills | Status: AC | PRN
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
## 2023-04-28 ENCOUNTER — Encounter (HOSPITAL_COMMUNITY): Payer: Self-pay

## 2023-04-28 ENCOUNTER — Other Ambulatory Visit: Payer: Self-pay

## 2023-04-28 ENCOUNTER — Emergency Department (HOSPITAL_COMMUNITY)
Admission: EM | Admit: 2023-04-28 | Discharge: 2023-04-28 | Discharge status: Against Medical Advice | Payer: 59 | Attending: Emergency Medicine | Admitting: Emergency Medicine

## 2023-04-28 DIAGNOSIS — R739 Hyperglycemia, unspecified: Secondary | ICD-10-CM | POA: Diagnosis present

## 2023-04-28 DIAGNOSIS — Z5321 Procedure and treatment not carried out due to patient leaving prior to being seen by health care provider: Secondary | ICD-10-CM | POA: Insufficient documentation

## 2023-04-28 DIAGNOSIS — Z794 Long term (current) use of insulin: Secondary | ICD-10-CM | POA: Diagnosis not present

## 2023-04-28 LAB — CBG MONITORING, ED: Glucose-Capillary: 246 mg/dL — ABNORMAL HIGH (ref 70–99)

## 2023-04-28 NOTE — ED Triage Notes (Signed)
Pt states he took his insulin shot at home but took his blood sugar again before going to bed & it read "dangerously high" of 264.  Pt states his lungs are burning as well but has had a CT for that.  No signs of distress at time of triage.

## 2023-08-18 ENCOUNTER — Ambulatory Visit: Admitting: Podiatry

## 2023-08-23 ENCOUNTER — Ambulatory Visit (INDEPENDENT_AMBULATORY_CARE_PROVIDER_SITE_OTHER): Admitting: Podiatry

## 2023-08-23 ENCOUNTER — Encounter: Payer: Self-pay | Admitting: Podiatry

## 2023-08-23 DIAGNOSIS — M722 Plantar fascial fibromatosis: Secondary | ICD-10-CM | POA: Diagnosis not present

## 2023-08-23 DIAGNOSIS — B351 Tinea unguium: Secondary | ICD-10-CM | POA: Diagnosis not present

## 2023-08-23 DIAGNOSIS — M79674 Pain in right toe(s): Secondary | ICD-10-CM

## 2023-08-23 DIAGNOSIS — E119 Type 2 diabetes mellitus without complications: Secondary | ICD-10-CM | POA: Diagnosis not present

## 2023-08-23 DIAGNOSIS — M79675 Pain in left toe(s): Secondary | ICD-10-CM | POA: Diagnosis not present

## 2023-08-23 NOTE — Progress Notes (Signed)
 This patient presents to the office with chief complaint of long thick nails and diabetic feet.  This patient  says there  is  no pain and discomfort in their feet.  He says he was experiencing pain in his arches but he has been wearing OTC insoles which have helped especially his knees. This patient says there are long thick painful nails.  These nails are painful walking and wearing shoes.  Patient has no history of infection or drainage from both feet.  Patient is unable to  self treat his own nails . This patient presents  to the office today for treatment of the  long nails and a foot evaluation due to history of  diabetes.  General Appearance  Alert, conversant and in no acute stress.  Vascular  Dorsalis pedis and posterior tibial  pulses are palpable  bilaterally.  Capillary return is within normal limits  bilaterally. Temperature is within normal limits  bilaterally.  Neurologic  Senn-Weinstein monofilament wire test within normal limits  bilaterally. Muscle power within normal limits bilaterally.  Nails Thick disfigured discolored nails with subungual debris  from hallux to fifth toes bilaterally. No evidence of bacterial infection or drainage bilaterally.  Orthopedic  No limitations of motion of motion feet .  No crepitus or effusions noted.  No bony pathology or digital deformities noted.  Skin  normotropic skin with no porokeratosis noted bilaterally.  No signs of infections or ulcers noted.     Onychomycosis  Diabetes with no foot complications.  Plantar Fasciitis  B/L.  IE  Debride nails x 10.  A diabetic foot exam was performed and there is no evidence of any vascular or neurologic pathology. Dispense powerstep insoles.  RTC prn   Ruffin Cotton DPM

## 2023-09-15 ENCOUNTER — Encounter (HOSPITAL_COMMUNITY): Payer: Self-pay

## 2023-09-15 ENCOUNTER — Other Ambulatory Visit: Payer: Self-pay

## 2023-09-15 ENCOUNTER — Emergency Department (HOSPITAL_COMMUNITY)
Admission: EM | Admit: 2023-09-15 | Discharge: 2023-09-15 | Discharge status: Against Medical Advice | Attending: Emergency Medicine | Admitting: Emergency Medicine

## 2023-09-15 DIAGNOSIS — R531 Weakness: Secondary | ICD-10-CM | POA: Insufficient documentation

## 2023-09-15 DIAGNOSIS — R799 Abnormal finding of blood chemistry, unspecified: Secondary | ICD-10-CM | POA: Diagnosis not present

## 2023-09-15 DIAGNOSIS — Z5321 Procedure and treatment not carried out due to patient leaving prior to being seen by health care provider: Secondary | ICD-10-CM | POA: Diagnosis not present

## 2023-09-15 LAB — CBC WITH DIFFERENTIAL/PLATELET
Abs Immature Granulocytes: 0.05 10*3/uL (ref 0.00–0.07)
Basophils Absolute: 0.1 10*3/uL (ref 0.0–0.1)
Basophils Relative: 1 %
Eosinophils Absolute: 0.8 10*3/uL — ABNORMAL HIGH (ref 0.0–0.5)
Eosinophils Relative: 7 %
HCT: 44.1 % (ref 39.0–52.0)
Hemoglobin: 14.3 g/dL (ref 13.0–17.0)
Immature Granulocytes: 0 %
Lymphocytes Relative: 39 %
Lymphs Abs: 4.4 10*3/uL — ABNORMAL HIGH (ref 0.7–4.0)
MCH: 27.6 pg (ref 26.0–34.0)
MCHC: 32.4 g/dL (ref 30.0–36.0)
MCV: 85 fL (ref 80.0–100.0)
Monocytes Absolute: 0.9 10*3/uL (ref 0.1–1.0)
Monocytes Relative: 8 %
Neutro Abs: 5.1 10*3/uL (ref 1.7–7.7)
Neutrophils Relative %: 45 %
Platelets: 282 10*3/uL (ref 150–400)
RBC: 5.19 MIL/uL (ref 4.22–5.81)
RDW: 13.9 % (ref 11.5–15.5)
WBC: 11.4 10*3/uL — ABNORMAL HIGH (ref 4.0–10.5)
nRBC: 0 % (ref 0.0–0.2)

## 2023-09-15 LAB — COMPREHENSIVE METABOLIC PANEL WITH GFR
ALT: 14 U/L (ref 0–44)
AST: 17 U/L (ref 15–41)
Albumin: 3.9 g/dL (ref 3.5–5.0)
Alkaline Phosphatase: 68 U/L (ref 38–126)
Anion gap: 12 (ref 5–15)
BUN: 13 mg/dL (ref 8–23)
CO2: 20 mmol/L — ABNORMAL LOW (ref 22–32)
Calcium: 9.4 mg/dL (ref 8.9–10.3)
Chloride: 105 mmol/L (ref 98–111)
Creatinine, Ser: 0.87 mg/dL (ref 0.61–1.24)
GFR, Estimated: >60 mL/min (ref >60)
Glucose, Bld: 132 mg/dL — ABNORMAL HIGH (ref 70–99)
Potassium: 3.6 mmol/L (ref 3.5–5.1)
Sodium: 137 mmol/L (ref 135–145)
Total Bilirubin: 0.6 mg/dL (ref 0.0–1.2)
Total Protein: 7.1 g/dL (ref 6.5–8.1)

## 2023-09-15 LAB — TROPONIN I (HIGH SENSITIVITY): Troponin I (High Sensitivity): 6 ng/L (ref <18)

## 2023-09-15 NOTE — ED Notes (Signed)
 Per first look, pt left.

## 2023-09-15 NOTE — ED Notes (Signed)
 Pt got his cell phone off charger and walked outside to take a call.

## 2023-09-15 NOTE — ED Provider Triage Note (Signed)
 Emergency Medicine Provider Triage Evaluation Note  ISIDORE FRANC , a 68 y.o. male  was evaluated in triage.  Pt complains of generalized weakness and abnormal labs, reports Cr is up.  Review of Systems  Positive: Presyncope  Negative: CP  Physical Exam  BP 134/78 (BP Location: Right Arm)   Pulse 88   Temp 97.9 F (36.6 C) (Oral)   Resp 18   Ht 5\' 6"  (1.676 m)   Wt 81.6 kg   SpO2 97%   BMI 29.05 kg/m  Gen:   Awake, no distress   Resp:  Normal effort  MSK:   Moves extremities without difficulty  Other:    Medical Decision Making  Medically screening exam initiated at 4:46 PM.  Appropriate orders placed.  CHARVIK ALLDREDGE was informed that the remainder of the evaluation will be completed by another provider, this initial triage assessment does not replace that evaluation, and the importance of remaining in the ED until their evaluation is complete.     Eudora Heron, [place] 09/15/23 [id number]

## 2023-09-15 NOTE — ED Triage Notes (Signed)
 Pt to er via ems, per ems pt is here because his doctor called him and told him to come to the er.  States that he doesn't know what the abnormal lab value is.  Pt states that he feels generally weak.

## 2023-09-29 ENCOUNTER — Emergency Department (HOSPITAL_COMMUNITY)
Admission: EM | Admit: 2023-09-29 | Discharge: 2023-09-29 | Discharge status: Against Medical Advice | Attending: Emergency Medicine | Admitting: Emergency Medicine

## 2023-09-29 ENCOUNTER — Other Ambulatory Visit: Payer: Self-pay

## 2023-09-29 DIAGNOSIS — F17200 Nicotine dependence, unspecified, uncomplicated: Secondary | ICD-10-CM | POA: Insufficient documentation

## 2023-09-29 DIAGNOSIS — Z5329 Procedure and treatment not carried out because of patient's decision for other reasons: Secondary | ICD-10-CM | POA: Diagnosis not present

## 2023-09-29 DIAGNOSIS — J449 Chronic obstructive pulmonary disease, unspecified: Secondary | ICD-10-CM | POA: Diagnosis not present

## 2023-09-29 DIAGNOSIS — E119 Type 2 diabetes mellitus without complications: Secondary | ICD-10-CM | POA: Insufficient documentation

## 2023-09-29 DIAGNOSIS — T43501A Poisoning by unspecified antipsychotics and neuroleptics, accidental (unintentional), initial encounter: Secondary | ICD-10-CM | POA: Insufficient documentation

## 2023-09-29 DIAGNOSIS — E039 Hypothyroidism, unspecified: Secondary | ICD-10-CM | POA: Insufficient documentation

## 2023-09-29 NOTE — ED Notes (Signed)
 Patient refusing EKG, MD Horton spoke with patient about leaving AMA. MD Horton explained risks to include death of leaving without medical treatment.

## 2023-09-29 NOTE — ED Notes (Signed)
 Pt states that he does not want an EKG and would like to leave AMA, pt signed paperwork and left

## 2023-09-29 NOTE — ED Triage Notes (Signed)
 Pt reports taking a total of 1400mg  of his seroquel  tonight on accident. Pt reports he normally take 1200mg  at night for sleep. Pt reports feeling tired and fatigued so he had someone drive him here to get checked out just in case. Reports he has not been able to get his dr to prescribe his normal klonopin  he has used for sleep, so he has had trouble sleeping.

## 2023-09-29 NOTE — ED Provider Notes (Signed)
 [place] EMERGENCY DEPARTMENT AT [facility] Provider Note   CSN: [id number] Arrival date & time: 09/29/23  0002     History  Chief Complaint  Patient presents with   Medication Reaction    [name] [name] is a 68 y.o. male.  HPI     This is a 68 year old male who presents with concern for taking too much Seroquel .  Patient reports that he has had issues with insomnia specifically over the last 3 weeks.  He has been on Seroquel  for some time but has also taken trazodone  and clonidine but has not been able to get additional prescriptions.  He is prescribed 800 mg of Seroquel  at night.  He would normally takes 800 mg.  If he wakes up he will take an additional 200.  However tonight he took 1200 mg at 6 PM and then woke up and took an additional 200 mg at 10 PM.  Patient is not having any symptoms.  Denies chest pain, shortness of breath, palpitations.  However, given that he took so much he states "I just wanted to get checked out."  Home Medications Prior to Admission medications   Medication Sig Start Date End Date Taking? Authorizing Provider  albuterol  (VENTOLIN  HFA) 108 (90 Base) MCG/ACT inhaler Inhale 1-2 puffs into the lungs every 4 (four) hours as needed for wheezing or shortness of breath.  01/23/19   [provider]  celecoxib  (CELEBREX ) 200 MG capsule Take 1 capsule (200 mg total) by mouth 2 (two) times daily. 10/29/21   Sueellen Emery, MD  cephALEXin  (KEFLEX ) 500 MG capsule Take 1 capsule (500 mg total) by mouth 4 (four) times daily. 12/05/21   Roemhildt, Lorin T, PA-C  clonazePAM  (KLONOPIN ) 1 MG tablet Take 1-2 mg by mouth 2 (two) times daily. Take one tablet during the day and two tablets at bedtime. 01/11/19   [provider]  esomeprazole (NEXIUM) 40 MG capsule Take 40 mg by mouth daily. 01/21/19   [provider]  indomethacin (INDOCIN) 25 MG capsule Take 25 mg by mouth 2 (two) times daily as needed (For gout.).     [provider]  insulin  glargine (LANTUS ) 100 unit/mL SOPN Inject 0.15 mLs (15 Units total) into the skin daily. 02/24/19   Evone Hoh, MD  Insulin  Pen Needle 31G X 5 MM MISC Use to inject regular insulin  three times daily 02/24/19   Evone Hoh, MD  insulin  regular (NOVOLIN R) 100 units/mL injection Inject 0.08 mLs (8 Units total) into the skin 3 (three) times daily before meals. 02/24/19   Evone Hoh, MD  Insulin  Syringe-Needle U-100 (INSULIN  SYRINGE .3CC/31GX5/16") 31G X 5/16" 0.3 ML MISC Use to inject regular insulin  three times daily 02/24/19   Evone Hoh, MD  levothyroxine  (SYNTHROID , LEVOTHROID) 150 MCG tablet Take 1 tablet (150 mcg total) by mouth every morning. For thyroid hormone replacement 08/27/16   Asuncion Layer I, NP  lidocaine  (LIDODERM ) 5 % Place 1 patch onto the skin daily. Remove & Discard patch within 12 hours or as directed by MD 04/20/19   Henderly, Britni A, PA-C  lidocaine  (LIDODERM ) 5 % Place 1 patch onto the skin daily. Remove & Discard patch within 12 hours or as directed by MD 12/18/22   Kommor, Alyse July, MD  meclizine (ANTIVERT) 25 MG tablet Take 12.5 mg by mouth 3 (three) times daily as needed for dizziness.  06/06/17   [provider]  metaxalone (SKELAXIN) 800 MG tablet Take 800 mg by mouth 4 (four)  times daily as needed for muscle spasms.  08/30/18   [provider]  methocarbamol  (ROBAXIN ) 500 MG tablet Take 1 tablet (500 mg total) by mouth 2 (two) times daily. 04/20/19   Henderly, Britni A, PA-C  predniSONE  (DELTASONE ) 20 MG tablet Take 20 mg by mouth See admin instructions. Take one tablet three times daily for 3 days, then one tablet twice daily for 3 days, then one tablet daily for 3 days. 02/16/19   [provider]  QUEtiapine  (SEROQUEL ) 400 MG tablet Take 1 tablet (400 mg total) by mouth at bedtime. For mood control Patient taking differently: Take 800 mg by mouth at bedtime. For mood control 08/27/16   Asuncion Layer I, NP   rosuvastatin  (CRESTOR ) 10 MG tablet Take 10 mg by mouth daily. 02/03/19   [provider]  traZODone  (DESYREL ) 100 MG tablet Take 1 tablet (100 mg total) by mouth at bedtime as needed for sleep. 08/27/16   Asuncion Layer I, NP      Allergies    Codeine, Lipitor [atorvastatin], Lithium , Pollen extract, Rosuvastatin , Talwin [pentazocine], and Metformin    Review of Systems   Review of Systems  Constitutional:  Negative for fever.  Respiratory:  Negative for shortness of breath.   Cardiovascular:  Negative for chest pain and palpitations.  Gastrointestinal:  Negative for abdominal pain, nausea and vomiting.  All other systems reviewed and are negative.   Physical Exam Updated Vital Signs BP 118/79 (BP Location: Left Arm)   Pulse 89   Temp (!) 97.5 F (36.4 C)   Resp 16   Ht 1.676 m (5\' 6" )   Wt 81.6 kg   SpO2 98%   BMI 29.05 kg/m  Physical Exam Vitals and nursing note reviewed.  Constitutional:      Appearance: He is well-developed. He is obese.     Comments: Smells of smoke  HENT:     Head: Normocephalic and atraumatic.  Eyes:     Pupils: Pupils are equal, round, and reactive to light.  Cardiovascular:     Rate and Rhythm: Normal rate and regular rhythm.  Pulmonary:     Effort: Pulmonary effort is normal. No respiratory distress.  Abdominal:     Palpations: Abdomen is soft.     Tenderness: There is no abdominal tenderness.  Musculoskeletal:     Cervical back: Neck supple.  Lymphadenopathy:     Cervical: No cervical adenopathy.  Skin:    General: Skin is warm and dry.  Neurological:     Mental Status: He is alert and oriented to person, place, and time.  Psychiatric:        Mood and Affect: Mood normal.     ED Results / Procedures / Treatments   Labs (all labs ordered are listed, but only abnormal results are displayed) Labs Reviewed - No data to display  EKG None  Radiology No results found.  Procedures Procedures    Medications Ordered in  ED Medications - No data to display  ED Course/ Medical Decision Making/ A&P Clinical Course as of 09/29/23 0104  Thu Sep 29, 2023  0043 Spoke to poison control.  Recommend EKG and 4-hour observation.  If no QTc prolongation, likely would not have ill effects.   [CH]  5409 Per nurse tech, patient refusing EKG want to leave AGAINST MEDICAL ADVICE.  Stated "if something bad was going to happen it would have already happen because I took hours ago." [CH]  0104 Spoke with patient.  He states he  does not want to wait.  Requested that he stay for at least an EKG.  Patient adamantly refuses.  Patient signed out AGAINST MEDICAL ADVICE. [CH]    Clinical Course User Index [CH] Dammon Makarewicz, Vonzella Guernsey, MD                                 Medical Decision Making  This patient presents to the ED for concern of overdose, this involves an extensive number of treatment options, and is a complaint that carries with it a high risk of complications and morbidity.  I considered the following differential and admission for this acute, potentially life threatening condition.  The differential diagnosis includes accidental overdose, QT prolongation  MDM:    This is a 68 year old male who presents with accidentally taking too much medication.  At baseline he normally takes a fairly large dose of 800 mg at night.  Tonight he took 1200 because he has had issues with insomnia.  He denies suicidal ideation.  He took 200 additional milligrams 4 hours later.  He is otherwise asymptomatic.  Poison control recommends 4 hours of observation from last ingestion and an EKG.  After attempting to obtain an EKG, patient refused and wanted to be discharged.  He states he just does not want to wait.  I tried to ask him to please stay just for the EKG but no prolonged observation.  But he again declined.  He understands the risk and benefits and was able to verbalize these back to me.  He signed out AGAINST MEDICAL ADVICE.  (Labs, imaging,  consults)  Labs: I Ordered, and personally interpreted labs.  The pertinent results include: None  Imaging Studies ordered: I ordered imaging studies including none I independently visualized and interpreted imaging. I agree with the radiologist interpretation  Additional history obtained from chart review.  External records from outside source obtained and reviewed including primary care notes and medications  Cardiac Monitoring: The patient was maintained on a cardiac monitor.  If on the cardiac monitor, I personally viewed and interpreted the cardiac monitored which showed an underlying rhythm of: Sinus  Reevaluation: After the interventions noted above, I reevaluated the patient and found that they have :stayed the same  Social Determinants of Health:  lives independently  Disposition: AMA  Co morbidities that complicate the patient evaluation  Past Medical History:  Diagnosis Date   Anxiety    Arthritis    Asthma    Bipolar 1 disorder (HCC)    Carpal tunnel syndrome on right 02/04/2016   Chronic insomnia    COPD (chronic obstructive pulmonary disease) (HCC)    Depression    Diabetes mellitus without complication (HCC)    Diaphragmatic hernia    Dizziness    ED (erectile dysfunction)    GERD (gastroesophageal reflux disease)    HLD (hyperlipidemia)    Hypothyroidism    SOB (shortness of breath)    Tobacco use    Umbilical hernia without obstruction and without gangrene      Medicines No orders of the defined types were placed in this encounter.   I have reviewed the patients home medicines and have made adjustments as needed  Problem List / ED Course: Problem List Items Addressed This Visit   None               Final Clinical Impression(s) / ED Diagnoses Final diagnoses:  None    Rx /  DC Orders ED Discharge Orders     None         Dereke Neumann, Vonzella Guernsey, MD 09/29/23 [phone]

## 2024-01-26 ENCOUNTER — Other Ambulatory Visit: Payer: Self-pay

## 2024-01-26 ENCOUNTER — Encounter (HOSPITAL_COMMUNITY): Payer: Self-pay

## 2024-01-26 ENCOUNTER — Emergency Department (HOSPITAL_COMMUNITY)
Admission: EM | Admit: 2024-01-26 | Discharge: 2024-01-26 | Disposition: A | Discharge status: Home / Self Care | Source: Ambulatory Visit | Attending: Emergency Medicine | Admitting: Emergency Medicine

## 2024-01-26 DIAGNOSIS — E162 Hypoglycemia, unspecified: Secondary | ICD-10-CM | POA: Insufficient documentation

## 2024-01-26 DIAGNOSIS — R531 Weakness: Secondary | ICD-10-CM | POA: Diagnosis not present

## 2024-01-26 DIAGNOSIS — Z794 Long term (current) use of insulin: Secondary | ICD-10-CM | POA: Insufficient documentation

## 2024-01-26 LAB — CBC WITH DIFFERENTIAL/PLATELET
Abs Immature Granulocytes: 0.08 K/uL — ABNORMAL HIGH (ref 0.00–0.07)
Basophils Absolute: 0.1 K/uL (ref 0.0–0.1)
Basophils Relative: 1 %
Eosinophils Absolute: 0.4 K/uL (ref 0.0–0.5)
Eosinophils Relative: 3 %
HCT: 46.9 % (ref 39.0–52.0)
Hemoglobin: 15.1 g/dL (ref 13.0–17.0)
Immature Granulocytes: 1 %
Lymphocytes Relative: 33 %
Lymphs Abs: 3.5 K/uL (ref 0.7–4.0)
MCH: 27.2 pg (ref 26.0–34.0)
MCHC: 32.2 g/dL (ref 30.0–36.0)
MCV: 84.4 fL (ref 80.0–100.0)
Monocytes Absolute: 0.7 K/uL (ref 0.1–1.0)
Monocytes Relative: 7 %
Neutro Abs: 6 K/uL (ref 1.7–7.7)
Neutrophils Relative %: 55 %
Platelets: 323 K/uL (ref 150–400)
RBC: 5.56 MIL/uL (ref 4.22–5.81)
RDW: 14.5 % (ref 11.5–15.5)
WBC: 10.7 K/uL — ABNORMAL HIGH (ref 4.0–10.5)
nRBC: 0 % (ref 0.0–0.2)

## 2024-01-26 LAB — URINALYSIS, W/ REFLEX TO CULTURE (INFECTION SUSPECTED)
Bacteria, UA: NONE SEEN
Bilirubin Urine: NEGATIVE
Glucose, UA: NEGATIVE mg/dL
Hgb urine dipstick: NEGATIVE
Ketones, ur: NEGATIVE mg/dL
Leukocytes,Ua: NEGATIVE
Nitrite: NEGATIVE
Protein, ur: NEGATIVE mg/dL
Specific Gravity, Urine: 1.009 (ref 1.005–1.030)
pH: 5 (ref 5.0–8.0)

## 2024-01-26 LAB — COMPREHENSIVE METABOLIC PANEL WITH GFR
ALT: 16 U/L (ref 0–44)
AST: 14 U/L — ABNORMAL LOW (ref 15–41)
Albumin: 4 g/dL (ref 3.5–5.0)
Alkaline Phosphatase: 76 U/L (ref 38–126)
Anion gap: 14 (ref 5–15)
BUN: 19 mg/dL (ref 8–23)
CO2: 21 mmol/L — ABNORMAL LOW (ref 22–32)
Calcium: 9.3 mg/dL (ref 8.9–10.3)
Chloride: 106 mmol/L (ref 98–111)
Creatinine, Ser: 1.13 mg/dL (ref 0.61–1.24)
GFR, Estimated: >60 mL/min (ref >60)
Glucose, Bld: 102 mg/dL — ABNORMAL HIGH (ref 70–99)
Potassium: 3.8 mmol/L (ref 3.5–5.1)
Sodium: 140 mmol/L (ref 135–145)
Total Bilirubin: 0.3 mg/dL (ref 0.0–1.2)
Total Protein: 6.6 g/dL (ref 6.5–8.1)

## 2024-01-26 LAB — CBG MONITORING, ED: Glucose-Capillary: 100 mg/dL — ABNORMAL HIGH (ref 70–99)

## 2024-01-26 LAB — I-STAT CHEM 8, ED
BUN: 20 mg/dL (ref 8–23)
Calcium, Ion: 1.23 mmol/L (ref 1.15–1.40)
Chloride: 107 mmol/L (ref 98–111)
Creatinine, Ser: 1.3 mg/dL — ABNORMAL HIGH (ref 0.61–1.24)
Glucose, Bld: 102 mg/dL — ABNORMAL HIGH (ref 70–99)
HCT: 44 % (ref 39.0–52.0)
Hemoglobin: 15 g/dL (ref 13.0–17.0)
Potassium: 3.7 mmol/L (ref 3.5–5.1)
Sodium: 140 mmol/L (ref 135–145)
TCO2: 21 mmol/L — ABNORMAL LOW (ref 22–32)

## 2024-01-26 LAB — CK: Total CK: 28 U/L — ABNORMAL LOW (ref 49–397)

## 2024-01-26 MED ORDER — SODIUM CHLORIDE 0.9 % IV SOLN: INTRAVENOUS | Status: DC

## 2024-01-26 NOTE — Discharge Instructions (Signed)
 Follow-up with your doctor.  Return here for inability to walk, change in bowel or bladder function, or any other problems

## 2024-01-26 NOTE — ED Triage Notes (Signed)
 Pt presents to ED from home C/O generalized weakness X 1 week. Also reports multiple episodes of hypoglycemia since the weakness began.

## 2024-01-26 NOTE — ED Provider Notes (Signed)
 [place] EMERGENCY DEPARTMENT AT [facility] Provider Note   CSN: [id number] Arrival date & time: 01/26/24  1129     Patient presents with: Weakness   [name] [name] is a 68 y.o. male.   68 year old who presents with 1 week history of weakness in his lower extremities.  States that when he walks for prolonged period of times he feels as if his legs will give out.  No bowel or bladder dysfunction.  No back pain associated with this.  States his legs become sore and his muscles will burn.  Recently decrease his dose of Seroquel  and states that he has had similar symptoms when he first started Seroquel .  Denies any psychiatric complaints at this time.  No upper extremity weakness.  No recent fever or other muscle aches.       Prior to Admission medications   Medication Sig Start Date End Date Taking? Authorizing Provider  albuterol  (VENTOLIN  HFA) 108 (90 Base) MCG/ACT inhaler Inhale 1-2 puffs into the lungs every 4 (four) hours as needed for wheezing or shortness of breath.  01/23/19   [provider]  celecoxib  (CELEBREX ) 200 MG capsule Take 1 capsule (200 mg total) by mouth 2 (two) times daily. 10/29/21   Dean Clarity, MD  cephALEXin  (KEFLEX ) 500 MG capsule Take 1 capsule (500 mg total) by mouth 4 (four) times daily. 12/05/21   Roemhildt, Lorin T, PA-C  clonazePAM  (KLONOPIN ) 1 MG tablet Take 1-2 mg by mouth 2 (two) times daily. Take one tablet during the day and two tablets at bedtime. 01/11/19   [provider]  esomeprazole (NEXIUM) 40 MG capsule Take 40 mg by mouth daily. 01/21/19   [provider]  indomethacin (INDOCIN) 25 MG capsule Take 25 mg by mouth 2 (two) times daily as needed (For gout.).     [provider]  insulin  glargine (LANTUS ) 100 unit/mL SOPN Inject 0.15 mLs (15 Units total) into the skin daily. 02/24/19   Carlyle Lenis, MD  Insulin  Pen Needle 31G X 5 MM MISC Use to inject regular insulin  three times daily 02/24/19    Carlyle Lenis, MD  insulin  regular (NOVOLIN R) 100 units/mL injection Inject 0.08 mLs (8 Units total) into the skin 3 (three) times daily before meals. 02/24/19   Carlyle Lenis, MD  Insulin  Syringe-Needle U-100 (INSULIN  SYRINGE .3CC/31GX5/16) 31G X 5/16 0.3 ML MISC Use to inject regular insulin  three times daily 02/24/19   Carlyle Lenis, MD  levothyroxine  (SYNTHROID , LEVOTHROID) 150 MCG tablet Take 1 tablet (150 mcg total) by mouth every morning. For thyroid hormone replacement 08/27/16   Collene Gouge I, NP  lidocaine  (LIDODERM ) 5 % Place 1 patch onto the skin daily. Remove & Discard patch within 12 hours or as directed by MD 04/20/19   Henderly, Britni A, PA-C  lidocaine  (LIDODERM ) 5 % Place 1 patch onto the skin daily. Remove & Discard patch within 12 hours or as directed by MD 12/18/22   Kommor, Lum, MD  meclizine (ANTIVERT) 25 MG tablet Take 12.5 mg by mouth 3 (three) times daily as needed for dizziness.  06/06/17   [provider]  metaxalone (SKELAXIN) 800 MG tablet Take 800 mg by mouth 4 (four) times daily as needed for muscle spasms.  08/30/18   [provider]  methocarbamol  (ROBAXIN ) 500 MG tablet Take 1 tablet (500 mg total) by mouth 2 (two) times daily. 04/20/19   Henderly, Britni A, PA-C  predniSONE  (DELTASONE ) 20 MG tablet Take 20 mg by mouth  See admin instructions. Take one tablet three times daily for 3 days, then one tablet twice daily for 3 days, then one tablet daily for 3 days. 02/16/19   [provider]  QUEtiapine  (SEROQUEL ) 400 MG tablet Take 1 tablet (400 mg total) by mouth at bedtime. For mood control Patient taking differently: Take 800 mg by mouth at bedtime. For mood control 08/27/16   Collene Gouge I, NP  rosuvastatin  (CRESTOR ) 10 MG tablet Take 10 mg by mouth daily. 02/03/19   [provider]  traZODone  (DESYREL ) 100 MG tablet Take 1 tablet (100 mg total) by mouth at bedtime as needed for sleep. 08/27/16   Collene Gouge I, NP     Allergies: Codeine, Lipitor [atorvastatin], Lithium , Pollen extract, Rosuvastatin , Talwin [pentazocine], and Metformin    Review of Systems  All other systems reviewed and are negative.   Updated Vital Signs BP (!) 146/84   Pulse 87   Temp (!) 97.4 F (36.3 C) (Axillary)   Resp 16   SpO2 100%   Physical Exam Vitals and nursing note reviewed.  Constitutional:      General: He is not in acute distress.    Appearance: Normal appearance. He is well-developed. He is not toxic-appearing.  HENT:     Head: Normocephalic and atraumatic.  Eyes:     General: Lids are normal.     Conjunctiva/sclera: Conjunctivae normal.     Pupils: Pupils are equal, round, and reactive to light.  Neck:     Thyroid: No thyroid mass.     Trachea: No tracheal deviation.  Cardiovascular:     Rate and Rhythm: Normal rate and regular rhythm.     Heart sounds: Normal heart sounds. No murmur heard.    No gallop.  Pulmonary:     Effort: Pulmonary effort is normal. No respiratory distress.     Breath sounds: Normal breath sounds. No stridor. No decreased breath sounds, wheezing, rhonchi or rales.  Abdominal:     General: There is no distension.     Palpations: Abdomen is soft.     Tenderness: There is no abdominal tenderness. There is no rebound.  Musculoskeletal:        General: No tenderness. Normal range of motion.     Cervical back: Normal range of motion and neck supple.  Skin:    General: Skin is warm and dry.     Findings: No abrasion or rash.  Neurological:     General: No focal deficit present.     Mental Status: He is alert and oriented to person, place, and time. Mental status is at baseline.     GCS: GCS eye subscore is 4. GCS verbal subscore is 5. GCS motor subscore is 6.     Cranial Nerves: No cranial nerve deficit.     Sensory: No sensory deficit.     Motor: Motor function is intact.     Gait: Gait is intact.     Comments: Strength is 5/5 in upper as well as lower extremities   Psychiatric:        Attention and Perception: Attention normal.        Speech: Speech normal.        Behavior: Behavior normal.     (all labs ordered are listed, but only abnormal results are displayed) Labs Reviewed  CBC WITH DIFFERENTIAL/PLATELET - Abnormal; Notable for the following components:      Result Value   WBC 10.7 (*)    Abs Immature Granulocytes 0.08 (*)  All other components within normal limits  I-STAT CHEM 8, ED - Abnormal; Notable for the following components:   Creatinine, Ser 1.30 (*)    Glucose, Bld 102 (*)    TCO2 21 (*)    All other components within normal limits  COMPREHENSIVE METABOLIC PANEL WITH GFR  URINALYSIS, W/ REFLEX TO CULTURE (INFECTION SUSPECTED)  CK    EKG: EKG Interpretation Date/Time:  Thursday January 26 2024 12:59:15 EDT Ventricular Rate:  76 PR Interval:  172 QRS Duration:  94 QT Interval:  408 QTC Calculation: 459 R Axis:   -6  Text Interpretation: Sinus rhythm No significant change since last tracing Confirmed by Dasie Faden ([id number]) on 01/26/2024 1:53:40 PM  Radiology: No results found.   Procedures   Medications Ordered in the ED  0.9 %  sodium chloride  infusion ( Intravenous New Bag/Given 01/26/24 1257)                                    Medical Decision Making Amount and/or Complexity of Data Reviewed Labs: ordered. ECG/medicine tests: ordered.  Risk Prescription drug management.   EKG shows normal sinus rhythm.  No signs of ischemic changes noted.  Labs here are reassuring.  Patient able to ambulate here at this time without difficulty.  Do not feel that he needs to have imaging of his spine or brain.  Patient given address of the follow-up with his doctor     Final diagnoses:  None    ED Discharge Orders     None          Dasie Faden, MD 01/26/24 1442
